# Patient Record
Sex: Female | Born: 1970
Health system: Southern US, Community
[De-identification: ages and names within clinical notes are randomized; demographics above are authoritative.]

## PROBLEM LIST (undated history)

## (undated) DIAGNOSIS — T8859XA Other complications of anesthesia, initial encounter: Secondary | ICD-10-CM

## (undated) DIAGNOSIS — T7840XA Allergy, unspecified, initial encounter: Secondary | ICD-10-CM

## (undated) DIAGNOSIS — T4145XA Adverse effect of unspecified anesthetic, initial encounter: Secondary | ICD-10-CM

## (undated) DIAGNOSIS — M1990 Unspecified osteoarthritis, unspecified site: Secondary | ICD-10-CM

## (undated) DIAGNOSIS — F419 Anxiety disorder, unspecified: Secondary | ICD-10-CM

## (undated) HISTORY — PX: CYST EXCISION: SHX5701

## (undated) HISTORY — PX: OVARY SURGERY: SHX727

## (undated) HISTORY — DX: Anxiety disorder, unspecified: F41.9

## (undated) HISTORY — DX: Allergy, unspecified, initial encounter: T78.40XA

---

## 1998-03-25 HISTORY — PX: BREAST ENHANCEMENT SURGERY: SHX7

## 1999-03-26 HISTORY — PX: AUGMENTATION MAMMAPLASTY: SUR837

## 2004-06-14 ENCOUNTER — Ambulatory Visit: Payer: Self-pay | Admitting: Obstetrics and Gynecology

## 2005-11-27 ENCOUNTER — Ambulatory Visit: Payer: Self-pay | Admitting: Orthopaedic Surgery

## 2007-08-18 ENCOUNTER — Ambulatory Visit: Payer: Self-pay | Admitting: Obstetrics and Gynecology

## 2007-08-24 ENCOUNTER — Ambulatory Visit: Payer: Self-pay | Admitting: Obstetrics and Gynecology

## 2008-03-25 HISTORY — PX: ABDOMINAL HYSTERECTOMY: SHX81

## 2012-02-18 ENCOUNTER — Ambulatory Visit: Payer: Self-pay | Admitting: Obstetrics and Gynecology

## 2012-12-30 ENCOUNTER — Encounter: Payer: Self-pay | Admitting: General Surgery

## 2012-12-30 ENCOUNTER — Ambulatory Visit (INDEPENDENT_AMBULATORY_CARE_PROVIDER_SITE_OTHER): Payer: 59 | Admitting: General Surgery

## 2012-12-30 ENCOUNTER — Ambulatory Visit: Payer: Self-pay | Admitting: General Surgery

## 2012-12-30 VITALS — BP 98/64 | HR 66 | Resp 12 | Ht 67.5 in | Wt 142.0 lb

## 2012-12-30 DIAGNOSIS — K429 Umbilical hernia without obstruction or gangrene: Secondary | ICD-10-CM

## 2012-12-30 NOTE — Progress Notes (Signed)
Patient ID: Barbara Frederick, female   DOB: 1970-10-11, 42 y.o.   MRN: [id number]  Chief Complaint  Patient presents with  . Other    mass below belly button    HPI Barbara Frederick is a 42 y.o. female here today for an evaluation mass below belly button becoming very painful, especially with direct pressure. Patient noticed this problem about 3 months ago and states it seems to be getting bigger. States it seems to about ping pong ball size. She has recently lost 20 pounds with weight watchers.  The uterus was removed with a morcellater on review of the August 24, 2007 operative report. Menstrual endometrium reported.   HPI  History reviewed. No pertinent past medical history.  Past Surgical History  Procedure Laterality Date  . Ovary surgery      one ovary [facility] cervix at [facility]  . Abdominal hysterectomy  2010    Dr Logan Bores    History reviewed. No pertinent family history.  Social History History  Substance Use Topics  . Smoking status: Never Smoker   . Smokeless tobacco: Never Used  . Alcohol Use: Yes     Comment: 3-5/week    Allergies  Allergen Reactions  . Codeine Itching    Current Outpatient Prescriptions  Medication Sig Dispense Refill  . estrogen-methylTESTOSTERone 0.625-1.25 MG per tablet Take 1 tablet by mouth daily.      . Multiple Vitamins-Minerals (WOMENS DAILY FORMULA PO) Take 1 tablet by mouth daily.      . progesterone (PROMETRIUM) 100 MG capsule Take 100 mg by mouth daily.      . Psyllium (METAMUCIL PO) Take 1 tablet by mouth daily.       No current facility-administered medications for this visit.    Review of Systems Review of Systems  Constitutional: Negative.   Respiratory: Negative.   Cardiovascular: Negative.     Blood pressure 98/64, pulse 66, resp. rate 12, height 5' 7.5" (1.715 m), weight 142 lb (64.411 kg).  Physical Exam Physical Exam  Constitutional: She is oriented to person, place, and time. She appears well-developed and  well-nourished.  Neck: Neck supple.  Cardiovascular: Normal rate, regular rhythm and normal heart sounds.   Pulmonary/Chest: Effort normal and breath sounds normal.  Abdominal: Soft. Normal appearance.  3 cm mass below umbilicus   Lymphadenopathy:    She has no cervical adenopathy.  Neurological: She is alert and oriented to person, place, and time.  Skin: Skin is warm and dry.    Data Reviewed Operative report. Pathology report.   Assessment    Umbilical/ventral hernia versus endometrioma.    Plan    Options for management were reviewed: 1) observation versus 2) exploration/excision/repair.  At this time the patient's reports of increasing size and increasing tenderness would speak towards elective exploration.     Patient's surgery has been scheduled for 01-29-13 at [facility].   Earline Mayotte 12/31/2012, 4:13 PM

## 2012-12-30 NOTE — Patient Instructions (Addendum)
Hernia, Surgical Repair A hernia occurs when an internal organ pushes out through a weak spot in the belly (abdominal) wall muscles. Hernias commonly occur in the groin and around the navel. Hernias often can be pushed back into place (reduced). Most hernias tend to get worse over time. Problems occur when abdominal contents get stuck in the opening (incarcerated hernia). The blood supply gets cut off (strangulated hernia). This is an emergency and needs surgery. Otherwise, hernia repair can be an elective procedure. This means you can schedule this at your convenience when an emergency is not present. Because complications can occur, if you decide to repair the hernia, it is best to do it soon. When it becomes an emergency procedure, there is increased risk of complications after surgery. CAUSES   Heavy lifting.  Obesity.  Prolonged coughing.  Straining to move your bowels.  Hernias can also occur through a cut (incision) by a surgeonafter an abdominal operation. HOME CARE INSTRUCTIONS Before the repair:  Bed rest is not required. You may continue your normal activities, but avoid heavy lifting (more than 10 pounds) or straining. Cough gently. If you are a smoker, it is best to stop. Even the best hernia repair can break down with the continual strain of coughing.  Do not wear anything tight over your hernia. Do not try to keep it in with an outside bandage or truss. These can damage abdominal contents if they are trapped in the hernia sac.  Eat a normal diet. Avoid constipation. Straining over long periods of time to have a bowel movement will increase hernia size. It also can breakdown repairs. If you cannot do this with diet alone, laxatives or stool softeners may be used. PRIOR TO SURGERY, SEEK IMMEDIATE MEDICAL CARE IF: You have problems (symptoms) of a trapped (incarcerated) hernia. Symptoms include:  An oral temperature above 102 F (38.9 C) develops, or as your caregiver  suggests.  Increasing abdominal pain.  Feeling sick to your stomach(nausea) and vomiting.  You stop passing gas or stool.  The hernia is stuck outside the abdomen, looks discolored, feels hard, or is tender.  You have any changes in your bowel habits or in the hernia that is unusual for you. LET YOUR CAREGIVERS KNOW ABOUT THE FOLLOWING:  Allergies.  Medications taken including herbs, eye drops, over the counter medications, and creams.  Use of steroids (by mouth or creams).  Family or personal history of problems with anesthetics or Novocaine.  Possibility of pregnancy, if this applies.  Personal history of blood clots (thrombophlebitis).  Family or personal history of bleeding or blood problems.  Previous surgery.  Other health problems. BEFORE THE PROCEDURE You should be present 1 hour prior to your procedure, or as directed by your caregiver.  AFTER THE PROCEDURE After surgery, you will be taken to the recovery area. A nurse will watch and check your progress there. Once you are awake, stable, and taking fluids well, you will be allowed to go home as long as there are no problems. Once home, an ice pack (wrapped in a light towel) applied to your operative site may help with discomfort. It may also keep the swelling down. Do not lift anything heavier than 10 pounds (4.55 kilograms). Take showers not baths. Do not drive while taking narcotics. Follow instructions as suggested by your caregiver.  SEEK IMMEDIATE MEDICAL CARE IF: After surgery:  There is redness, swelling, or increasing pain in the wound.  There is pus coming from the wound.  There is  drainage from a wound lasting longer than 1 day.  An unexplained oral temperature above 102 F (38.9 C) develops.  You notice a foul smell coming from the wound or dressing.  There is a breaking open of a wound (edged not staying together) after the sutures have been removed.  You notice increasing pain in the shoulders  (shoulder strap areas).  You develop dizzy episodes or fainting while standing.  You develop persistent nausea or vomiting.  You develop a rash.  You have difficulty breathing.  You develop any reaction or side effects to medications given. MAKE SURE YOU:   Understand these instructions.  Will watch your condition.  Will get help right away if you are not doing well or get worse. Document Released: 09/04/2000 Document Revised: 06/03/2011 Document Reviewed: 07/28/2007 [facility] Patient Information 2014 [place], [place].    Hernia precautions and incarceration were discussed with the patient. If they develop symptoms of an incarcerated hernia, they were encouraged to seek prompt medical attention.  I have recommended repair of the hernia using mesh on an outpatient basis in the near future. The risk of infection was reviewed. The role of prosthetic mesh to minimize the risk of recurrence was reviewed.  Patient's surgery has been scheduled for 01-29-13 at [facility]

## 2012-12-31 ENCOUNTER — Other Ambulatory Visit: Payer: Self-pay | Admitting: General Surgery

## 2012-12-31 ENCOUNTER — Telehealth: Payer: Self-pay | Admitting: *Deleted

## 2012-12-31 ENCOUNTER — Encounter: Payer: Self-pay | Admitting: General Surgery

## 2012-12-31 DIAGNOSIS — K429 Umbilical hernia without obstruction or gangrene: Secondary | ICD-10-CM | POA: Insufficient documentation

## 2012-12-31 DIAGNOSIS — R1905 Periumbilic swelling, mass or lump: Secondary | ICD-10-CM | POA: Insufficient documentation

## 2012-12-31 NOTE — Telephone Encounter (Signed)
Patient called to cancel umbilical hernia that was scheduled for 01-29-13 at [facility]. She does not want to have surgery at this time and will call back if she wishes to reschedule.   Message has been left on the O.R. Posting line with this information. Phone interview cancelled in Provider Portal.

## 2013-01-14 ENCOUNTER — Ambulatory Visit: Payer: Self-pay | Admitting: Surgery

## 2013-01-22 ENCOUNTER — Ambulatory Visit: Payer: Self-pay | Admitting: Surgery

## 2013-02-24 ENCOUNTER — Ambulatory Visit: Payer: Self-pay | Admitting: Obstetrics and Gynecology

## 2013-10-28 ENCOUNTER — Encounter: Payer: Self-pay | Admitting: Psychiatry

## 2013-11-23 ENCOUNTER — Encounter: Payer: Self-pay | Admitting: Psychiatry

## 2013-12-23 ENCOUNTER — Encounter: Payer: Self-pay | Admitting: Psychiatry

## 2014-01-24 ENCOUNTER — Encounter: Payer: Self-pay | Admitting: General Surgery

## 2014-04-27 ENCOUNTER — Ambulatory Visit: Payer: Self-pay | Admitting: Cardiology

## 2014-04-28 ENCOUNTER — Ambulatory Visit: Payer: Self-pay | Admitting: Obstetrics & Gynecology

## 2014-05-02 ENCOUNTER — Ambulatory Visit: Payer: Self-pay | Admitting: Cardiovascular Disease

## 2014-05-03 ENCOUNTER — Encounter: Payer: Self-pay | Admitting: *Deleted

## 2014-05-11 ENCOUNTER — Ambulatory Visit (INDEPENDENT_AMBULATORY_CARE_PROVIDER_SITE_OTHER): Payer: 59 | Admitting: General Surgery

## 2014-05-11 ENCOUNTER — Encounter: Payer: Self-pay | Admitting: General Surgery

## 2014-05-11 VITALS — BP 138/78 | HR 82 | Resp 12 | Ht 67.0 in | Wt 154.0 lb

## 2014-05-11 DIAGNOSIS — N644 Mastodynia: Secondary | ICD-10-CM | POA: Insufficient documentation

## 2014-05-11 NOTE — Progress Notes (Signed)
Patient ID: Barbara Frederick, female   DOB: November 28, 1970, 44 y.o.   MRN: [id number]  Chief Complaint  Patient presents with  . Other    bilateral breast pain    HPI Barbara Frederick is a 44 y.o. female who presents for left breast pain. She states she noticed the pain approximately 3-4 weeks ago. The pain has stayed about the same. The pain is described as a burning sensation as well as an achiness. She also states she has occasional shooting pain that comes and goes. The pain is located in the left outer quadrant centrally. The pain is always there but at times its worse than others. She denies any lump but does state she feels like the left breast is larger than the right. Her most recent mammogram was done on 04/28/14. The shooting pain starts deep and goes outward towards the nipple. The left breast is warm to the touch. No injuries to the breasts. No trouble with her breasts in the past. She has not tried anything for the breast pain.   The patient was accompanied for the exam and post exam and interview by her husband.  HPI  Past Medical History  Diagnosis Date  . Anxiety   . Allergy     Past Surgical History  Procedure Laterality Date  . Ovary surgery      [facility] cervix at [facility]  . Abdominal hysterectomy  2010    Dr Amalia Hailey  . Breast enhancement surgery Bilateral [place] [facility]  . Cyst excision      Family History  Problem Relation Age of Onset  . Cancer Paternal Aunt 51    breast  . Cancer Paternal Grandmother 58    breast    Social History History  Substance Use Topics  . Smoking status: Never Smoker   . Smokeless tobacco: Never Used  . Alcohol Use: Yes     Comment: 3-5/week    Allergies  Allergen Reactions  . Codeine Itching    Current Outpatient Prescriptions  Medication Sig Dispense Refill  . Ascorbic Acid (VITAMIN C PO) Take by mouth.    . Loratadine (CLARITIN PO) Take by mouth.    . mometasone (NASONEX) 50  MCG/ACT nasal spray Place 2 sprays into the nose daily.    . Multiple Vitamins-Minerals (WOMENS DAILY FORMULA PO) Take 1 tablet by mouth daily.    . progesterone (PROMETRIUM) 100 MG capsule Take 100 mg by mouth daily.    . Psyllium (METAMUCIL PO) Take 1 tablet by mouth daily.    . VENLAFAXINE HCL PO Take by mouth.     No current facility-administered medications for this visit.    Review of Systems Review of Systems  Constitutional: Negative.   Respiratory: Negative.   Cardiovascular: Negative.     Blood pressure 138/78, pulse 82, resp. rate 12, height 5\' 7"  (1.702 m), weight 154 lb (69.854 kg).  Physical Exam Physical Exam  Constitutional: She is oriented to person, place, and time. She appears well-developed and well-nourished.  Neck: Neck supple. No thyromegaly present.  Cardiovascular: Normal rate, regular rhythm and normal heart sounds.   No murmur heard. Pulmonary/Chest: Effort normal and breath sounds normal. Right breast exhibits no inverted nipple, no mass, no nipple discharge, no skin change and no tenderness. Left breast exhibits no inverted nipple, no mass, no nipple discharge, no skin change and no tenderness.  Lymphadenopathy:    She has no cervical adenopathy.  She has no axillary adenopathy.  Neurological: She is alert and oriented to person, place, and time.  Skin: Skin is warm and dry.    Data Reviewed Bilateral diagnostic mammograms dated 04/28/2014 were reviewed. BI-RADS-1.  The breasts are essentially fatty replaced. No architectural distortion. No evidence of implant rupture.  Assessment    Benign breast exam, episodic breast pain.    Plan    The patient has been encouraged to make use of a two-week trial of Aleve, 2 tablets twice a day. She was encouraged to be sure that her undergarments fit properly. She was asked to give a phone follow-up in 2 weeks with her progress. If she has not had improvement over 2 months, we'll consider further  investigation. With a benign exam and a fatty replaced breast, I think there is little likelihood of a missed malignancy. Without evidence of a palpable abnormality or mammographic abnormality, little suspicion for implant rupture.    PCP:  Miguel Aschoff Ref. MD: [facility]  Robert Bellow 05/11/2014, 8:53 PM

## 2014-05-11 NOTE — Patient Instructions (Addendum)
Patient advised to use Aleve 2 twice a day to help with pain relief. The patient is aware to call back for any questions or concerns. Patient advised to contact our office in 2 weeks with a report of how the Aleve is working.

## 2014-05-17 LAB — HEPATIC FUNCTION PANEL
ALT: 17 U/L (ref 7–35)
AST: 27 U/L (ref 13–35)
Alkaline Phosphatase: 54 U/L (ref 25–125)
Bilirubin, Total: 0.6 mg/dL

## 2014-05-17 LAB — LIPID PANEL
Cholesterol: 179 mg/dL (ref 0–200)
HDL: 61 mg/dL (ref 35–70)
LDL Cholesterol: 96 mg/dL
LDL/HDL RATIO: 1.6
TRIGLYCERIDES: 109 mg/dL (ref 40–160)

## 2014-05-17 LAB — BASIC METABOLIC PANEL
BUN: 12 mg/dL (ref 4–21)
CREATININE: 0.9 mg/dL (ref 0.5–1.1)
Glucose: 101 mg/dL
Potassium: 4.3 mmol/L (ref 3.4–5.3)
Sodium: 143 mmol/L (ref 137–147)

## 2014-05-17 LAB — CBC AND DIFFERENTIAL
HCT: 39 % (ref 36–46)
Hemoglobin: 13.4 g/dL (ref 12.0–16.0)
Neutrophils Absolute: 4 /uL
Platelets: 221 10*3/uL (ref 150–399)
WBC: 6.5 10*3/mL

## 2014-05-17 LAB — TSH: TSH: 2.41 u[IU]/mL (ref 0.41–5.90)

## 2014-07-15 NOTE — Op Note (Signed)
PATIENT NAME:  Barbara Frederick, Barbara Frederick MR#:  [id number] DATE OF BIRTH:  1970-12-26  DATE OF PROCEDURE:  01/22/2013  PREOPERATIVE DIAGNOSIS: Possible incarcerated umbilical hernia.   POSTOPERATIVE DIAGNOSIS: Abdominal wall mass.   PROCEDURE: Excision of abdominal wall mass.   SURGEON: Rochel Brome, M.D.   ANESTHESIA: General.   INDICATIONS: This 44 year old has had 2 previous laparoscopies and presented with a mass just below the umbilicus and it appeared on exam.  It may possibly be an incarcerated umbilical hernia and repair was recommended for definitive treatment.   DESCRIPTION OF PROCEDURE: The patient was placed on the operating table in the supine position under general anesthesia. The abdomen was prepared with ChloraPrep and draped in a sterile manner.   A short longitudinal incision was made extending from the inferior aspect of the umbilicus caudally.  The incision was approximately 2.5 cm in length.  It was carried down through the subcutaneous tissues to encounter a mass which was dissected free from surrounding structures. Did have a smooth plane of resection and appeared to have minimal blood supply. The mass was completely excised. It did have a smooth surface and had a somewhat white appearance. The measurement was 2.8 cm and was submitted in formalin for routine pathology. The wound was inspected. Several small bleeding points were cauterized. Subcutaneous tissues were infiltrated with Sensorcaine with epinephrine. The subcutaneous tissues were approximated with a 5-0 Monocryl suture and then the skin was closed with running 5-0 Monocryl subcuticular suture and Dermabond. The patient tolerated surgery satisfactorily and was then prepared for transfer to the recovery room.   ____________________________ Lenna Sciara. Rochel Brome, MD jws:dp D: 01/22/2013 11:13:48 ET T: 01/22/2013 11:20:44 ET JOB#: [id number]  cc: Loreli Dollar, MD, <Dictator> Loreli Dollar MD ELECTRONICALLY SIGNED 01/22/2013  18:28

## 2014-09-02 ENCOUNTER — Other Ambulatory Visit: Payer: Self-pay | Admitting: Emergency Medicine

## 2014-09-02 ENCOUNTER — Encounter: Payer: Self-pay | Admitting: Emergency Medicine

## 2014-09-02 DIAGNOSIS — G4700 Insomnia, unspecified: Secondary | ICD-10-CM | POA: Insufficient documentation

## 2014-09-02 DIAGNOSIS — F32A Depression, unspecified: Secondary | ICD-10-CM | POA: Insufficient documentation

## 2014-09-02 DIAGNOSIS — F329 Major depressive disorder, single episode, unspecified: Secondary | ICD-10-CM | POA: Insufficient documentation

## 2014-09-02 DIAGNOSIS — F419 Anxiety disorder, unspecified: Secondary | ICD-10-CM | POA: Insufficient documentation

## 2014-09-02 DIAGNOSIS — M1990 Unspecified osteoarthritis, unspecified site: Secondary | ICD-10-CM | POA: Insufficient documentation

## 2014-09-02 DIAGNOSIS — K219 Gastro-esophageal reflux disease without esophagitis: Secondary | ICD-10-CM | POA: Insufficient documentation

## 2014-09-02 DIAGNOSIS — J302 Other seasonal allergic rhinitis: Secondary | ICD-10-CM | POA: Insufficient documentation

## 2014-11-10 ENCOUNTER — Encounter: Payer: Self-pay | Admitting: Family Medicine

## 2014-11-10 ENCOUNTER — Ambulatory Visit (INDEPENDENT_AMBULATORY_CARE_PROVIDER_SITE_OTHER): Payer: 59 | Admitting: Family Medicine

## 2014-11-10 VITALS — BP 102/74 | HR 64 | Temp 98.4°F | Resp 12 | Wt 147.0 lb

## 2014-11-10 DIAGNOSIS — F329 Major depressive disorder, single episode, unspecified: Secondary | ICD-10-CM

## 2014-11-10 DIAGNOSIS — G4700 Insomnia, unspecified: Secondary | ICD-10-CM

## 2014-11-10 DIAGNOSIS — F419 Anxiety disorder, unspecified: Secondary | ICD-10-CM

## 2014-11-10 DIAGNOSIS — F32A Depression, unspecified: Secondary | ICD-10-CM

## 2014-11-10 NOTE — Progress Notes (Signed)
Patient ID: Barbara Frederick, female   DOB: [birth year], 44 y.o.   MRN: [id number]    Subjective:  HPI  Depression follow up: Patient was last seen in February 2016. In March she called and wanted to get off Effexor. She is off the medication and feels fine. Not having any issues of concern.  Insomnia follow up: She is doing well with this. She has Xanax that she takes maybe 1 to 2 times a month to help her sleep or if she gets anxious about something coming up. No issues of concern.   Patient has stopped all of her hormonal medications and vitamins except for Multivitamin, Nasonex, Claritin. She feels good.  Prior to Admission medications   Medication Sig Start Date End Date Taking? Authorizing Provider  ALPRAZolam Duanne Moron) 0.5 MG tablet Take by mouth. 03/02/14  Yes Historical Provider, MD  loratadine (CLARITIN) 10 MG tablet Take by mouth.   Yes Historical Provider, MD  mometasone (NASONEX) 50 MCG/ACT nasal spray Place into the nose.   Yes Historical Provider, MD  Multiple Vitamin (MULTI-VITAMINS) TABS Take by mouth.   Yes Historical Provider, MD    Patient Active Problem List   Diagnosis Date Noted  . Anxiety 09/02/2014  . Arthritis 09/02/2014  . Clinical depression 09/02/2014  . Gastro-esophageal reflux disease without esophagitis 09/02/2014  . Insomnia 09/02/2014  . Allergic rhinitis, seasonal 09/02/2014  . Breast pain, left 05/11/2014  . Umbilical hernia 40/98/1191  . Abdominal wall mass of periumbilical region 47/82/9562    Past Medical History  Diagnosis Date  . Anxiety   . Allergy     Social History   Social History  . Marital Status: Married    Spouse Name: N/A  . Number of Children: N/A  . Years of Education: N/A   Occupational History  . Not on file.   Social History Main Topics  . Smoking status: Never Smoker   . Smokeless tobacco: Never Used  . Alcohol Use: Yes     Comment: 3-5/week  . Drug Use: No  . Sexual Activity: Not on file   Other  Topics Concern  . Not on file   Social History Narrative    Allergies  Allergen Reactions  . Codeine Itching    itching  . Codeine Phosphate Itching    Review of Systems  Constitutional: Negative.   Respiratory: Negative.   Cardiovascular: Negative.   Gastrointestinal: Negative.   Musculoskeletal: Negative.   Endo/Heme/Allergies: Negative.   Psychiatric/Behavioral: Negative.      There is no immunization history on file for this patient. Objective:  BP 102/74 mmHg  Pulse 64  Temp(Src) 98.4 F (36.9 C)  Resp 12  Wt 147 lb (66.679 kg)  Physical Exam  Constitutional: She is oriented to person, place, and time and well-developed, well-nourished, and in no distress.  HENT:  Head: Normocephalic and atraumatic.  Right Ear: External ear normal.  Left Ear: External ear normal.  Nose: Nose normal.  Eyes: Conjunctivae are normal. Pupils are equal, round, and reactive to light.  Neck: Normal range of motion. Neck supple.  Cardiovascular: Normal rate, regular rhythm, normal heart sounds and intact distal pulses.   No murmur heard. Pulmonary/Chest: Effort normal and breath sounds normal. No respiratory distress. She has no wheezes.  Musculoskeletal: Normal range of motion. She exhibits no edema or tenderness.  Neurological: She is alert and oriented to person, place, and time. Gait normal.  Skin: Skin is warm and dry.  Psychiatric: Mood, memory, affect and judgment  normal.    Lab Results  Component Value Date   WBC 6.5 05/17/2014   HGB 13.4 05/17/2014   HCT 39 05/17/2014   PLT 221 05/17/2014   CHOL 179 05/17/2014   TRIG 109 05/17/2014   HDL 61 05/17/2014   LDLCALC 96 05/17/2014   TSH 2.41 05/17/2014    CMP     Component Value Date/Time   NA 143 05/17/2014   K 4.3 05/17/2014   BUN 12 05/17/2014   CREATININE 0.9 05/17/2014   AST 27 05/17/2014   ALT 17 05/17/2014   ALKPHOS 54 05/17/2014    Assessment and Plan :  1. Clinical depression Stable off the  medication. Follow.  2. Insomnia Stable. Uses Xanax 1 to 2 times a month.  3. Anxiety Stable.  4. AR Stable on Nasonex and Claritin.  I will see her back in 2017 for a medical physical. GYN at [facility]  Patient was seen and examined by Dr. Eulas Post and note was scribed by Theressa Millard, RMA.    Miguel Aschoff MD [facility] 11/10/2014 8:39 AM

## 2015-01-19 DIAGNOSIS — D229 Melanocytic nevi, unspecified: Secondary | ICD-10-CM

## 2015-01-19 HISTORY — DX: Melanocytic nevi, unspecified: D22.9

## 2015-04-03 ENCOUNTER — Ambulatory Visit: Payer: 59 | Admitting: Family Medicine

## 2015-04-05 ENCOUNTER — Ambulatory Visit (INDEPENDENT_AMBULATORY_CARE_PROVIDER_SITE_OTHER): Payer: 59 | Admitting: Family Medicine

## 2015-04-05 ENCOUNTER — Encounter: Payer: Self-pay | Admitting: Family Medicine

## 2015-04-05 VITALS — BP 110/72 | HR 74 | Temp 97.8°F | Resp 16 | Wt 155.0 lb

## 2015-04-05 DIAGNOSIS — F419 Anxiety disorder, unspecified: Secondary | ICD-10-CM

## 2015-04-05 DIAGNOSIS — F329 Major depressive disorder, single episode, unspecified: Secondary | ICD-10-CM | POA: Diagnosis not present

## 2015-04-05 DIAGNOSIS — F32A Depression, unspecified: Secondary | ICD-10-CM

## 2015-04-05 NOTE — Progress Notes (Signed)
Patient ID: Barbara Frederick, female   DOB: 11-12-1970, 45 y.o.   MRN: [id number]    Subjective:  HPI Pt reports that she started having similar symptoms that she had when she was diagnosed with depression and anxiety. It started right around/before the Holidays. At first she thought it was the [facility] making her feel more anxious, glumly, aggravated and down. But since the holidays have passed she is still feeling the same. She had taken Effexor in the past and she felt better but she gained 20 lbs and she does not want to gain weight again. She would like to discuss what her other options are. Pt thinks it could be possible seasonal effective disorder.  Prior to Admission medications   Medication Sig Start Date End Date Taking? Authorizing Provider  loratadine (CLARITIN) 10 MG tablet Take by mouth.   Yes Historical Provider, MD  mometasone (NASONEX) 50 MCG/ACT nasal spray Place into the nose.   Yes Historical Provider, MD  Multiple Vitamin (MULTI-VITAMINS) TABS Take by mouth.   Yes Historical Provider, MD  ALPRAZolam Duanne Moron) 0.5 MG tablet Take by mouth. Reported on 04/05/2015 03/02/14   Historical Provider, MD    Patient Active Problem List   Diagnosis Date Noted  . Anxiety 09/02/2014  . Arthritis 09/02/2014  . Clinical depression 09/02/2014  . Gastro-esophageal reflux disease without esophagitis 09/02/2014  . Insomnia 09/02/2014  . Allergic rhinitis, seasonal 09/02/2014  . Breast pain, left 05/11/2014  . Umbilical hernia 0000000  . Abdominal wall mass of periumbilical region 0000000    Past Medical History  Diagnosis Date  . Anxiety   . Allergy     Social History   Social History  . Marital Status: Married    Spouse Name: N/A  . Number of Children: N/A  . Years of Education: N/A   Occupational History  . Not on file.   Social History Main Topics  . Smoking status: Never Smoker   . Smokeless tobacco: Never Used  . Alcohol Use: Yes     Comment: 3-5/week  .  Drug Use: No  . Sexual Activity: Not on file   Other Topics Concern  . Not on file   Social History Narrative    Allergies  Allergen Reactions  . Codeine Itching    itching  . Codeine Phosphate Itching    Review of Systems  Constitutional: Negative.   HENT: Negative.   Eyes: Negative.   Respiratory: Negative.   Cardiovascular: Negative.   Gastrointestinal: Negative.   Genitourinary: Negative.   Musculoskeletal: Positive for joint pain (sees ortho for left knee pain).  Skin: Negative.   Neurological: Negative.   Endo/Heme/Allergies: Negative.   Psychiatric/Behavioral: Positive for depression. The patient is nervous/anxious.     Immunization History  Administered Date(s) Administered  . Influenza-Unspecified 01/07/2015   Objective:  BP 110/72 mmHg  Pulse 74  Temp(Src) 97.8 F (36.6 C) (Oral)  Resp 16  Wt 155 lb (70.308 kg)  Physical Exam  Constitutional: She is oriented to person, place, and time and well-developed, well-nourished, and in no distress.  HENT:  Head: Normocephalic and atraumatic.  Right Ear: External ear normal.  Left Ear: External ear normal.  Nose: Nose normal.  Eyes: Conjunctivae and EOM are normal. Pupils are equal, round, and reactive to light.  Neck: Normal range of motion. Neck supple.  Cardiovascular: Normal rate, regular rhythm, normal heart sounds and intact distal pulses.   Pulmonary/Chest: Effort normal and breath sounds normal.  Abdominal: Soft.  Musculoskeletal: Normal  range of motion.  Neurological: She is alert and oriented to person, place, and time. She has normal reflexes. Gait normal. GCS score is 15.  Skin: Skin is warm and dry.  Psychiatric: Mood, memory, affect and judgment normal.    Lab Results  Component Value Date   WBC 6.5 05/17/2014   HGB 13.4 05/17/2014   HCT 39 05/17/2014   PLT 221 05/17/2014   CHOL 179 05/17/2014   TRIG 109 05/17/2014   HDL 61 05/17/2014   LDLCALC 96 05/17/2014   TSH 2.41 05/17/2014     CMP     Component Value Date/Time   NA 143 05/17/2014   K 4.3 05/17/2014   BUN 12 05/17/2014   CREATININE 0.9 05/17/2014   AST 27 05/17/2014   ALT 17 05/17/2014   ALKPHOS 54 05/17/2014    Assessment and Plan :  1. Clinical depression Depression screen [facility] 2/9 04/05/2015 11/10/2014  Decreased Interest 2 0  Down, Depressed, Hopeless 3 0  PHQ - 2 Score 5 0  Altered sleeping 1 -  Tired, decreased energy 2 -  Change in appetite 2 -  Feeling bad or failure about yourself  3 -  Trouble concentrating 3 -  Moving slowly or fidgety/restless 2 -  Suicidal thoughts 0 -  PHQ-9 Score 18 -  Difficult doing work/chores Somewhat difficult -   2. Anxiety  Possible seasonal affective disorder, recommended her to get a lamp that is especially made for this. Follow up in about a month.  Would try Cymbalta  If not improving.  Patient was seen and examined by Dr. Miguel Aschoff, and noted scribed by Webb Laws, [place] MD [facility] 04/05/2015 11:30 AM

## 2015-04-05 NOTE — Patient Instructions (Signed)
Google seasonal affective disorder and look for lamp that is specially made for this.

## 2015-04-11 DIAGNOSIS — Z62810 Personal history of physical and sexual abuse in childhood: Secondary | ICD-10-CM | POA: Diagnosis not present

## 2015-04-11 DIAGNOSIS — Z01419 Encounter for gynecological examination (general) (routine) without abnormal findings: Secondary | ICD-10-CM | POA: Diagnosis not present

## 2015-04-11 DIAGNOSIS — Z90710 Acquired absence of both cervix and uterus: Secondary | ICD-10-CM | POA: Diagnosis not present

## 2015-05-15 ENCOUNTER — Ambulatory Visit: Payer: 59 | Admitting: Family Medicine

## 2015-05-29 ENCOUNTER — Other Ambulatory Visit: Payer: Self-pay | Admitting: Obstetrics & Gynecology

## 2015-05-29 DIAGNOSIS — Z1231 Encounter for screening mammogram for malignant neoplasm of breast: Secondary | ICD-10-CM

## 2015-05-30 ENCOUNTER — Other Ambulatory Visit: Payer: Self-pay | Admitting: Obstetrics & Gynecology

## 2015-05-30 ENCOUNTER — Ambulatory Visit
Admission: RE | Admit: 2015-05-30 | Discharge: 2015-05-30 | Disposition: A | Discharge status: Home / Self Care | Payer: 59 | Source: Ambulatory Visit | Attending: Obstetrics & Gynecology | Admitting: Obstetrics & Gynecology

## 2015-05-30 DIAGNOSIS — Z1231 Encounter for screening mammogram for malignant neoplasm of breast: Secondary | ICD-10-CM

## 2015-06-27 ENCOUNTER — Encounter: Payer: Self-pay | Admitting: Family Medicine

## 2015-06-27 ENCOUNTER — Ambulatory Visit (INDEPENDENT_AMBULATORY_CARE_PROVIDER_SITE_OTHER): Payer: 59 | Admitting: Family Medicine

## 2015-06-27 VITALS — BP 108/62 | HR 74 | Temp 97.8°F | Resp 16 | Ht 67.0 in | Wt 157.0 lb

## 2015-06-27 DIAGNOSIS — Z0000 Encounter for general adult medical examination without abnormal findings: Secondary | ICD-10-CM | POA: Diagnosis not present

## 2015-06-27 NOTE — Progress Notes (Signed)
Patient ID: Barbara Frederick, female   DOB: [birth year], 45 y.o.   MRN: [id number]       Patient: Barbara Frederick, Female    DOB: 23-Nov-1970, 45 y.o.   MRN: [id number] Visit Date: 06/27/2015  Today's Provider: Wilhemena Durie, MD   Chief Complaint  Patient presents with  . Annual Exam   Subjective:    Annual physical exam Barbara Frederick is a 45 y.o. female who presents today for health maintenance and complete physical. She feels well. She reports exercising, walking, running, tennis about 3- 5 days a week. She reports she is sleeping fairly well.  ----------------------------------------------------------------- Mammogram- 05/30/15 normal Pap- possibly last year at [place] [facility].  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   Review of Systems  Constitutional: Negative.   HENT: Negative.   Eyes: Negative.   Respiratory: Negative.   Cardiovascular: Negative.   Gastrointestinal: Negative.   Endocrine: Negative.   Genitourinary: Negative.   Musculoskeletal: Negative.   Skin: Negative.   Allergic/Immunologic: Negative.   Neurological: Negative.   Hematological: Negative.   Psychiatric/Behavioral: Negative.     Social History      She  reports that she has never smoked. She has never used smokeless tobacco. She reports that she drinks alcohol. She reports that she does not use illicit drugs.       Social History   Social History  . Marital Status: Married    Spouse Name: N/A  . Number of Children: N/A  . Years of Education: N/A   Social History Main Topics  . Smoking status: Never Smoker   . Smokeless tobacco: Never Used  . Alcohol Use: Yes     Comment: 3-5/week  . Drug Use: No  . Sexual Activity: Not Asked   Other Topics Concern  . None   Social History Narrative    Past Medical History  Diagnosis Date  . Anxiety   . Allergy      Patient Active Problem List   Diagnosis Date Noted  . Anxiety 09/02/2014  .  Arthritis 09/02/2014  . Clinical depression 09/02/2014  . Gastro-esophageal reflux disease without esophagitis 09/02/2014  . Insomnia 09/02/2014  . Allergic rhinitis, seasonal 09/02/2014  . Breast pain, left 05/11/2014  . Umbilical hernia 0000000  . Abdominal wall mass of periumbilical region 0000000    Past Surgical History  Procedure Laterality Date  . Ovary surgery      one ovary [facility] cervix [facility]  . Abdominal hysterectomy  2010    Dr Amalia Hailey  . Breast enhancement surgery Bilateral [facility]  . Cyst excision    . Augmentation mammaplasty Bilateral 2001    Family History        Family Status  Relation Status Death Age  . Paternal Grandmother Deceased     cause of death was Alzhemier's  . Mother Alive   . Father Alive   . Sister Alive   . Brother Alive         Her family history includes Cancer (age of onset: 80) in her paternal aunt; Cancer (age of onset: 63) in her paternal grandmother; Hypertension in her mother.    Allergies  Allergen Reactions  . Codeine Itching    itching  . Codeine Phosphate Itching    Previous Medications   ALPRAZOLAM (XANAX) 0.5 MG TABLET    Take by mouth. Reported on 04/05/2015   LORATADINE (CLARITIN) 10  MG TABLET    Take by mouth.   MOMETASONE (NASONEX) 50 MCG/ACT NASAL SPRAY    Place into the nose.   MULTIPLE VITAMIN (MULTI-VITAMINS) TABS    Take by mouth.    Patient Care Team: Jerrol Banana., MD as PCP - General (Family Medicine) Robert Bellow, MD (General Surgery) No Pcp Per Patient [facility]) Honor Loh Ward, MD as Referring Physician (Obstetrics and Gynecology)     Objective:   Vitals: BP 108/62 mmHg  Pulse 74  Temp(Src) 97.8 F (36.6 C) (Oral)  Resp 16  Ht 5\' 7"  (1.702 m)  Wt 157 lb (71.215 kg)  BMI 24.58 kg/m2   Physical Exam  Constitutional: She is oriented to person, place, and time. She appears well-developed and well-nourished.  HENT:  Head: Normocephalic  and atraumatic.  Right Ear: External ear normal.  Left Ear: External ear normal.  Nose: Nose normal.  Mouth/Throat: Oropharynx is clear and moist.  Eyes: Conjunctivae and EOM are normal. Pupils are equal, round, and reactive to light.  Neck: Normal range of motion. Neck supple.  Cardiovascular: Normal rate, regular rhythm, normal heart sounds and intact distal pulses.   Pulmonary/Chest: Effort normal and breath sounds normal.  Abdominal: Soft. Bowel sounds are normal.  Musculoskeletal: Normal range of motion.  Neurological: She is alert and oriented to person, place, and time. She has normal reflexes.  Skin: Skin is warm and dry.  Psychiatric: She has a normal mood and affect. Her behavior is normal. Judgment and thought content normal.     Depression Screen PHQ 2/9 Scores 04/05/2015 11/10/2014  PHQ - 2 Score 5 0  PHQ- 9 Score 18 -      Assessment & Plan:     Routine Health Maintenance and Physical Exam  Exercise Activities and Dietary recommendations Goals    None      Immunization History  Administered Date(s) Administered  . Influenza-Unspecified 01/07/2015    Health Maintenance  Topic Date Due  . HIV Screening  04/13/1983  . TETANUS/TDAP  04/13/1987  . PAP SMEAR  04/12/1989  . INFLUENZA VACCINE  10/24/2015     gynecology  evaluation/exam per Dr. Leonides Schanz at [place]. Discussed health benefits of physical activity, and encouraged her to engage in regular exercise appropriate for her age and condition.   I have done the exam and reviewed the above chart and it is accurate to the best of my knowledge.  --------------------------------------------------------------------

## 2015-07-05 DIAGNOSIS — L578 Other skin changes due to chronic exposure to nonionizing radiation: Secondary | ICD-10-CM | POA: Diagnosis not present

## 2015-07-05 DIAGNOSIS — L820 Inflamed seborrheic keratosis: Secondary | ICD-10-CM | POA: Diagnosis not present

## 2015-07-05 DIAGNOSIS — L812 Freckles: Secondary | ICD-10-CM | POA: Diagnosis not present

## 2015-07-05 DIAGNOSIS — D485 Neoplasm of uncertain behavior of skin: Secondary | ICD-10-CM | POA: Diagnosis not present

## 2015-07-05 DIAGNOSIS — D229 Melanocytic nevi, unspecified: Secondary | ICD-10-CM | POA: Diagnosis not present

## 2015-07-05 DIAGNOSIS — L718 Other rosacea: Secondary | ICD-10-CM | POA: Diagnosis not present

## 2015-08-30 DIAGNOSIS — D229 Melanocytic nevi, unspecified: Secondary | ICD-10-CM | POA: Diagnosis not present

## 2015-08-30 DIAGNOSIS — L739 Follicular disorder, unspecified: Secondary | ICD-10-CM | POA: Diagnosis not present

## 2015-09-01 DIAGNOSIS — M1712 Unilateral primary osteoarthritis, left knee: Secondary | ICD-10-CM | POA: Diagnosis not present

## 2015-11-13 ENCOUNTER — Encounter: Payer: 59 | Admitting: Family Medicine

## 2016-01-22 ENCOUNTER — Ambulatory Visit (INDEPENDENT_AMBULATORY_CARE_PROVIDER_SITE_OTHER): Payer: 59 | Admitting: Family Medicine

## 2016-01-22 ENCOUNTER — Ambulatory Visit
Admission: RE | Admit: 2016-01-22 | Discharge: 2016-01-22 | Disposition: A | Discharge status: Home / Self Care | Payer: 59 | Source: Ambulatory Visit | Attending: Family Medicine | Admitting: Family Medicine

## 2016-01-22 ENCOUNTER — Encounter: Payer: Self-pay | Admitting: Family Medicine

## 2016-01-22 VITALS — BP 110/68 | HR 68 | Temp 97.8°F | Resp 16 | Wt 165.0 lb

## 2016-01-22 DIAGNOSIS — D1800 Hemangioma unspecified site: Secondary | ICD-10-CM | POA: Diagnosis not present

## 2016-01-22 DIAGNOSIS — F339 Major depressive disorder, recurrent, unspecified: Secondary | ICD-10-CM

## 2016-01-22 DIAGNOSIS — D229 Melanocytic nevi, unspecified: Secondary | ICD-10-CM | POA: Diagnosis not present

## 2016-01-22 DIAGNOSIS — R938 Abnormal findings on diagnostic imaging of other specified body structures: Secondary | ICD-10-CM | POA: Insufficient documentation

## 2016-01-22 DIAGNOSIS — L718 Other rosacea: Secondary | ICD-10-CM | POA: Diagnosis not present

## 2016-01-22 DIAGNOSIS — R05 Cough: Secondary | ICD-10-CM | POA: Diagnosis not present

## 2016-01-22 DIAGNOSIS — L812 Freckles: Secondary | ICD-10-CM | POA: Diagnosis not present

## 2016-01-22 DIAGNOSIS — R059 Cough, unspecified: Secondary | ICD-10-CM | POA: Insufficient documentation

## 2016-01-22 DIAGNOSIS — L578 Other skin changes due to chronic exposure to nonionizing radiation: Secondary | ICD-10-CM | POA: Diagnosis not present

## 2016-01-22 DIAGNOSIS — L821 Other seborrheic keratosis: Secondary | ICD-10-CM | POA: Diagnosis not present

## 2016-01-22 DIAGNOSIS — D485 Neoplasm of uncertain behavior of skin: Secondary | ICD-10-CM | POA: Diagnosis not present

## 2016-01-22 DIAGNOSIS — Z1283 Encounter for screening for malignant neoplasm of skin: Secondary | ICD-10-CM | POA: Diagnosis not present

## 2016-01-22 DIAGNOSIS — L820 Inflamed seborrheic keratosis: Secondary | ICD-10-CM | POA: Diagnosis not present

## 2016-01-22 MED ORDER — BUPROPION HCL ER (XL) 150 MG PO TB24: 150.0000 mg | ORAL_TABLET | Freq: Every day | ORAL | 5 refills | Status: DC

## 2016-01-22 NOTE — Progress Notes (Signed)
Patient: Barbara Frederick Female    DOB: Dec 07, 1970   45 y.o.   MRN: [id number] Visit Date: 01/22/2016  Today's Provider: Wilhemena Durie, MD   Chief Complaint  Patient presents with  . Depression  . Cough   Subjective:    Both the patient's 2 children are now in college and she has an empty nest with her husband. He definitely feels depressed over this. Lights  That  helped her SAB she is still using. I encouraged her to continue this. She is irritable and sad. She is not suicidal. She also has a cough and is worried about a family history of cancer. I think this is also affected by her depression and anxiety. She says she had a lot of exposure to secondary smoke as a child. Office been for about 6 months. It is very minimal and occasionally brings up some sputum. She does not feel sick at all. No other symptoms.   Depression         The problem has been gradually worsening since onset.  Associated symptoms include decreased concentration, fatigue, insomnia, irritable, restlessness, decreased interest, appetite change (increased) and sad.  Associated symptoms include no helplessness, no hopelessness, no body aches and no suicidal ideas.     The symptoms are aggravated by family issues (empty nest).  Treatments tried: Effexor; stopped taking due to weight gain. Would like to ask about the differences between Wellbutrin and venlafaxine. Pt also tried "light therapy" last year, but that has seemed to lose it's effect on pt.  Depression screen PHQ 2/9 01/22/2016  Decreased Interest 2  Down, Depressed, Hopeless 1  PHQ - 2 Score 3  Altered sleeping 1  Tired, decreased energy 1  Change in appetite 1  Feeling bad or failure about yourself  2  Trouble concentrating 3  Moving slowly or fidgety/restless 1  Suicidal thoughts 0  PHQ-9 Score 12  Difficult doing work/chores -    Cough Pt has noticed a productive cough when she exercises. Sputum is yellow/green. Has been  occurring for the last 8 months. Denies dyspnea. Pt is concerned because she has a strong family history of cancer, and was exposed to secondary smoke.    Allergies  Allergen Reactions  . Codeine Itching    itching  . Codeine Phosphate Itching     Current Outpatient Prescriptions:  .  ALPRAZolam (XANAX) 0.5 MG tablet, Take by mouth. Reported on 04/05/2015, Disp: , Rfl:  .  loratadine (CLARITIN) 10 MG tablet, Take by mouth., Disp: , Rfl:  .  mometasone (NASONEX) 50 MCG/ACT nasal spray, Place into the nose., Disp: , Rfl:  .  Multiple Vitamin (MULTI-VITAMINS) TABS, Take by mouth., Disp: , Rfl:   Review of Systems  Constitutional: Positive for appetite change (increased) and fatigue.  Eyes: Negative.   Respiratory: Positive for cough.   Cardiovascular: Negative.   Endocrine: Negative.   Allergic/Immunologic: Negative.   Neurological: Negative.   Hematological: Negative.   Psychiatric/Behavioral: Positive for decreased concentration and depression. Negative for suicidal ideas. The patient has insomnia.     Social History  Substance Use Topics  . Smoking status: Never Smoker  . Smokeless tobacco: Never Used  . Alcohol use Yes     Comment: 3-5/week   Objective:   BP 110/68 (BP Location: Left Arm, Patient Position: Sitting, Cuff Size: Normal)   Pulse 68   Temp 97.8 F (36.6 C) (Oral)   Resp 16   Wt 165 lb (  74.8 kg)   SpO2 97%   BMI 25.84 kg/m   Physical Exam  Constitutional: She is oriented to person, place, and time. She appears well-developed and well-nourished. She is irritable.  HENT:  Head: Normocephalic and atraumatic.  Right Ear: External ear normal.  Left Ear: External ear normal.  Nose: Nose normal.  Mouth/Throat: Oropharynx is clear and moist.  Eyes: Conjunctivae are normal. Pupils are equal, round, and reactive to light.  Cardiovascular: Normal rate, regular rhythm and normal heart sounds.   Pulmonary/Chest: Effort normal and breath sounds normal. No  respiratory distress.  Abdominal: Soft.  Neurological: She is alert and oriented to person, place, and time.  Skin: Skin is dry.  Psychiatric: She has a normal mood and affect. Her behavior is normal. Judgment and thought content normal.        Assessment & Plan:     1. Episode of recurrent major depressive disorder, unspecified depression episode severity ([place]) Worsening. Did not like Venlafaxine. Will treat with Wellbutrin as below. FU 1 month.Also consider counseling through [facility]. Patient earlier stated that she thought she gained weight with the Effexor but now realizes this was not the case. May go back to an SSRI on her next visit if she does not improve with the Wellbutrin - buPROPion (WELLBUTRIN XL) 150 MG 24 hr tablet; Take 1 tablet (150 mg total) by mouth daily.  Dispense: 30 tablet; Refill: 5  2. Cough Will check CXR. FU pending results.Patient asks about low-dose CT of the chest but I do not think she qualifies for this with the symptoms. She is a nonsmoker. - DG Chest 2 View; Future     Patient seen and examined by Miguel Aschoff, MD, and note scribed by Renaldo Fiddler, CMA.   Astou Lada Cranford Mon, MD  [facility]

## 2016-01-24 ENCOUNTER — Telehealth: Payer: Self-pay

## 2016-01-24 ENCOUNTER — Telehealth: Payer: Self-pay | Admitting: Family Medicine

## 2016-01-24 DIAGNOSIS — R05 Cough: Secondary | ICD-10-CM

## 2016-01-24 DIAGNOSIS — R9389 Abnormal findings on diagnostic imaging of other specified body structures: Secondary | ICD-10-CM

## 2016-01-24 DIAGNOSIS — R059 Cough, unspecified: Secondary | ICD-10-CM

## 2016-01-24 NOTE — Telephone Encounter (Signed)
Dr. Rosanna Randy, the [facility] said with abnormal xray findings it is usually just with contrast. Do you still want both or do you just want with?

## 2016-01-24 NOTE — Telephone Encounter (Signed)
[id number] 

## 2016-01-24 NOTE — Telephone Encounter (Signed)
In his message he said with and without. I did not see a normal CT of the chest order that was with and without contrast. If there is an order number that you have I can change it if I need to.

## 2016-01-24 NOTE — Telephone Encounter (Signed)
It's [id number] but CT tech at [facility] states that usually for abnormal findings on x ray it is CT chest W contrast

## 2016-01-24 NOTE — Telephone Encounter (Signed)
-----   Message from Jerrol Banana., MD sent at 01/23/2016  1:45 PM EDT ----- It does not look like cancer but this is not a completely normal chest x-ray. Would obtain CT of chest. Most likely with and without contrast

## 2016-01-24 NOTE — Telephone Encounter (Signed)
There is an order in EP[facility] for CT of chest with contrast and also a separate order for CT of chest without contrast.Which one is correct test ?

## 2016-01-24 NOTE — Telephone Encounter (Signed)
Pt informed. Orders placed for Ct

## 2016-01-25 NOTE — Telephone Encounter (Signed)
Just with--thanks

## 2016-01-25 NOTE — Telephone Encounter (Signed)
Order put in for chest with contrast only-aa

## 2016-01-25 NOTE — Telephone Encounter (Signed)
See below let us know if need to change orders-aa

## 2016-01-25 NOTE — Telephone Encounter (Signed)
OIK to change order --thx

## 2016-01-31 ENCOUNTER — Telehealth: Payer: Self-pay | Admitting: Family Medicine

## 2016-01-31 ENCOUNTER — Other Ambulatory Visit: Payer: Self-pay

## 2016-01-31 ENCOUNTER — Ambulatory Visit
Admission: RE | Admit: 2016-01-31 | Discharge: 2016-01-31 | Disposition: A | Discharge status: Home / Self Care | Payer: 59 | Source: Ambulatory Visit | Attending: Family Medicine | Admitting: Family Medicine

## 2016-01-31 DIAGNOSIS — R599 Enlarged lymph nodes, unspecified: Secondary | ICD-10-CM | POA: Diagnosis not present

## 2016-01-31 DIAGNOSIS — R938 Abnormal findings on diagnostic imaging of other specified body structures: Secondary | ICD-10-CM | POA: Diagnosis present

## 2016-01-31 DIAGNOSIS — C8590 Non-Hodgkin lymphoma, unspecified, unspecified site: Secondary | ICD-10-CM

## 2016-01-31 DIAGNOSIS — J929 Pleural plaque without asbestos: Secondary | ICD-10-CM | POA: Insufficient documentation

## 2016-01-31 DIAGNOSIS — R059 Cough, unspecified: Secondary | ICD-10-CM

## 2016-01-31 DIAGNOSIS — R05 Cough: Secondary | ICD-10-CM | POA: Diagnosis not present

## 2016-01-31 DIAGNOSIS — R9389 Abnormal findings on diagnostic imaging of other specified body structures: Secondary | ICD-10-CM

## 2016-01-31 MED ORDER — IOPAMIDOL (ISOVUE-300) INJECTION 61%
75.0000 mL | Freq: Once | INTRAVENOUS | Status: AC | PRN
  Administered 2016-01-31: 75 mL via INTRAVENOUS

## 2016-01-31 NOTE — Telephone Encounter (Signed)
Pt is requesting Dr. Rosanna Randy return her call to discuss the CT scan results that she saw on My Chart. Please advise. Thanks TNP

## 2016-01-31 NOTE — Telephone Encounter (Signed)
Spoke with dr Rosanna Randy and Barbara Frederick. Patient is on the way now to discuss this=-aa

## 2016-02-01 ENCOUNTER — Other Ambulatory Visit: Payer: Self-pay

## 2016-02-01 ENCOUNTER — Other Ambulatory Visit: Payer: Self-pay | Admitting: Family Medicine

## 2016-02-01 DIAGNOSIS — C8590 Non-Hodgkin lymphoma, unspecified, unspecified site: Secondary | ICD-10-CM | POA: Diagnosis not present

## 2016-02-01 NOTE — Progress Notes (Signed)
Ct abd

## 2016-02-02 ENCOUNTER — Inpatient Hospital Stay: Payer: 59

## 2016-02-02 ENCOUNTER — Inpatient Hospital Stay: Payer: 59 | Attending: Internal Medicine | Admitting: Internal Medicine

## 2016-02-02 ENCOUNTER — Encounter: Payer: Self-pay | Admitting: Internal Medicine

## 2016-02-02 DIAGNOSIS — M25551 Pain in right hip: Secondary | ICD-10-CM | POA: Diagnosis not present

## 2016-02-02 DIAGNOSIS — R590 Localized enlarged lymph nodes: Secondary | ICD-10-CM

## 2016-02-02 DIAGNOSIS — Z90710 Acquired absence of both cervix and uterus: Secondary | ICD-10-CM | POA: Insufficient documentation

## 2016-02-02 DIAGNOSIS — F419 Anxiety disorder, unspecified: Secondary | ICD-10-CM | POA: Diagnosis not present

## 2016-02-02 DIAGNOSIS — Z9882 Breast implant status: Secondary | ICD-10-CM | POA: Insufficient documentation

## 2016-02-02 DIAGNOSIS — R05 Cough: Secondary | ICD-10-CM | POA: Diagnosis not present

## 2016-02-02 DIAGNOSIS — Z79899 Other long term (current) drug therapy: Secondary | ICD-10-CM | POA: Diagnosis not present

## 2016-02-02 LAB — CBC WITH DIFFERENTIAL/PLATELET
BASOS: 1 %
Basophils Absolute: 0 10*3/uL (ref 0.0–0.2)
EOS (ABSOLUTE): 0.2 10*3/uL (ref 0.0–0.4)
EOS: 4 %
HEMATOCRIT: 40 % (ref 34.0–46.6)
HEMOGLOBIN: 14 g/dL (ref 11.1–15.9)
IMMATURE GRANULOCYTES: 0 %
Immature Grans (Abs): 0 10*3/uL (ref 0.0–0.1)
LYMPHS ABS: 1.4 10*3/uL (ref 0.7–3.1)
Lymphs: 24 %
MCH: 31.1 pg (ref 26.6–33.0)
MCHC: 35 g/dL (ref 31.5–35.7)
MCV: 89 fL (ref 79–97)
MONOCYTES: 10 %
MONOS ABS: 0.6 10*3/uL (ref 0.1–0.9)
Neutrophils Absolute: 3.7 10*3/uL (ref 1.4–7.0)
Neutrophils: 61 %
Platelets: 253 10*3/uL (ref 150–379)
RBC: 4.5 x10E6/uL (ref 3.77–5.28)
RDW: 13.2 % (ref 12.3–15.4)
WBC: 6 10*3/uL (ref 3.4–10.8)

## 2016-02-02 LAB — COMPREHENSIVE METABOLIC PANEL
A/G RATIO: 1.8 (ref 1.2–2.2)
ALBUMIN: 4.5 g/dL (ref 3.5–5.5)
ALT: 20 IU/L (ref 0–32)
AST: 26 IU/L (ref 0–40)
Alkaline Phosphatase: 73 IU/L (ref 39–117)
BUN / CREAT RATIO: 14 (ref 9–23)
BUN: 13 mg/dL (ref 6–24)
Bilirubin Total: 0.3 mg/dL (ref 0.0–1.2)
CALCIUM: 9.2 mg/dL (ref 8.7–10.2)
CO2: 24 mmol/L (ref 18–29)
CREATININE: 0.9 mg/dL (ref 0.57–1.00)
Chloride: 99 mmol/L (ref 96–106)
GFR calc Af Amer: 88 mL/min/{1.73_m2} (ref >59)
GFR, EST NON AFRICAN AMERICAN: 76 mL/min/{1.73_m2} (ref >59)
GLOBULIN, TOTAL: 2.5 g/dL (ref 1.5–4.5)
Glucose: 107 mg/dL — ABNORMAL HIGH (ref 65–99)
POTASSIUM: 4.3 mmol/L (ref 3.5–5.2)
SODIUM: 140 mmol/L (ref 134–144)
Total Protein: 7 g/dL (ref 6.0–8.5)

## 2016-02-02 LAB — LIPID PANEL WITH LDL/HDL RATIO
Cholesterol, Total: 233 mg/dL — ABNORMAL HIGH (ref 100–199)
HDL: 58 mg/dL (ref >39)
LDL CALC: 138 mg/dL — AB (ref 0–99)
LDL/HDL RATIO: 2.4 ratio (ref 0.0–3.2)
Triglycerides: 185 mg/dL — ABNORMAL HIGH (ref 0–149)
VLDL Cholesterol Cal: 37 mg/dL (ref 5–40)

## 2016-02-02 LAB — C-REACTIVE PROTEIN

## 2016-02-02 LAB — TSH: TSH: 3.77 u[IU]/mL (ref 0.450–4.500)

## 2016-02-02 LAB — LACTATE DEHYDROGENASE: LDH: 169 U/L (ref 98–192)

## 2016-02-02 NOTE — Progress Notes (Signed)
Patient is here for her results,  Pain in hip area that is dull and achy right side this is new. When she has the pain 5/10.

## 2016-02-02 NOTE — Progress Notes (Signed)
  Oncology Nurse Navigator Documentation Received call from Dr. Rogue Bussing to arrange for appt with Dr. Mortimer Fries for appt next week before scheduled ct scan. Appt has already been arranged for 11/16 at 11:15.  Navigator Location: [facility] (02/02/16 1300)   )                          Barriers/Navigation Needs: Coordination of Care (02/02/16 1300)   Interventions: Coordination of Care (02/02/16 1300)   Coordination of Care: Appts (02/02/16 1300)                  Time Spent with Patient: 15 (02/02/16 1300)

## 2016-02-02 NOTE — Assessment & Plan Note (Addendum)
#   Predominant mediastinal adenopathy/in the context of chronic cough- up to 2 cm in size. Unclear etiology. No clear lung parenchymal masses noted. The differential diagnosis includes benign versus malignant. Awaiting CT of the abdomen pelvis for further evaluation of lymphadenopathy/ target for biopsy.   # If the CT of the abdomen and pelvis does not show any possible targeted for biopsy- EBUS would be reasonable option. The patient's case was discussed at the tumor conference November 9.th. We will refer patient to Dr. Mortimer Fries.    # Will check LDH; ACE levels.   # Follow-up with me in approximately 3 weeks' no labs; sooner if needed.   I reviewed the images myself and with the patient and family in detail.  Thank you Dr.Gilbert for allowing me to participate in the care of your pleasant patient. Please do not hesitate to contact me with questions or concerns in the interim.

## 2016-02-02 NOTE — Progress Notes (Signed)
[place] [facility] NOTE  Patient Care Team: Jerrol Banana., MD as PCP - General (Family Medicine) Robert Bellow, MD (General Surgery) No Pcp Per Patient (General Practice) Maceo Pro, MD as Referring Physician (Obstetrics and Gynecology)  CHIEF COMPLAINTS/PURPOSE OF CONSULTATION: mediastinal adenopathy.  #  NOV 2017- CT chest MEDIASTINAL ADENOPATHY ~2cm   No history exists.     HISTORY OF PRESENTING ILLNESS:  Barbara Frederick 45 y.o.  female with no significant past medical history noted to have a cough for the last 1 year. However she noted to have a cough worsening along with yellowish sputum over the last few months. Given the above complaints patient had a CT scan of the chest that showed moderate mediastinal adenopathy - referred to [place] for further evaluation and recommendations.    She also noted to have dyspnea on exertion the last few months. She has had episodes of chronic night sweats approximately once a month. Otherwise admits to good appetite. Denies any nausea vomiting. Denies any hemoptysis. Denies any chest pain. No headaches. No fevers or chills.  She also noted to have right hip pain 5 on a scale of 10 the last 1 month or so. Otherwise denies any radiating pain down the leg. She traveled to [place]   in June 2017. No history of any  Smoking.   ROS: A complete 10 point review of system is done which is negative except mentioned above in history of present illness  MEDICAL HISTORY:  Past Medical History:  Diagnosis Date  . Allergy   . Anxiety     SURGICAL HISTORY: Past Surgical History:  Procedure Laterality Date  . ABDOMINAL HYSTERECTOMY  2010   Dr Amalia Hailey  . AUGMENTATION MAMMAPLASTY Bilateral 2001  . BREAST ENHANCEMENT SURGERY Bilateral [employer] [facility]  . CYST EXCISION    . OVARY SURGERY     one ovary and cervix at [facility]: Social History   Social History  . Marital status:  Married    Spouse name: N/A  . Number of children: N/A  . Years of education: N/A   Occupational History  . Not on file.   Social History Main Topics  . Smoking status: Never Smoker  . Smokeless tobacco: Never Used  . Alcohol use Yes     Comment: 3-5/week  . Drug use: No  . Sexual activity: Not on file   Other Topics Concern  . Not on file   Social History Narrative  . No narrative on file    FAMILY HISTORY: Family History  Problem Relation Age of Onset  . Cancer Paternal Grandmother 82    breast  . Hypertension Mother   . Emphysema Father   . Hypertension Brother   . Cancer Paternal Aunt 62    breast  . Colon cancer Maternal Grandfather   . Rectal cancer Maternal Grandfather   . Lung cancer Paternal Grandfather     ALLERGIES:  is allergic to codeine.  MEDICATIONS:  Current Outpatient Prescriptions  Medication Sig Dispense Refill  . ALPRAZolam (XANAX) 0.5 MG tablet Take by mouth. Reported on 04/05/2015    . buPROPion (WELLBUTRIN XL) 150 MG 24 hr tablet Take 1 tablet (150 mg total) by mouth daily. 30 tablet 5  . doxycycline (ORACEA) 40 MG capsule TAKE ONE CAPSULE BY MOUTH WITH food AND plenty OF IN FLUID  3  . Multiple Vitamin (MULTI-VITAMINS) TABS Take by mouth.    Marland Kitchen  RHOFADE 1 % CREA APPLY TO THE AFFECTED AREA EVERY MORNING  3  . loratadine (CLARITIN) 10 MG tablet Take by mouth.    . mometasone (NASONEX) 50 MCG/ACT nasal spray Place into the nose.     No current facility-administered medications for this visit.       Marland Kitchen  PHYSICAL EXAMINATION: ECOG PERFORMANCE STATUS: 0 - Asymptomatic  Vitals:   02/02/16 1123  BP: 124/86  Pulse: 68  Resp: 18  Temp: (!) 96.4 F (35.8 C)   Filed Weights   02/02/16 1123  Weight: 160 lb (72.6 kg)    GENERAL: Well-nourished well-developed; Alert, no distress and comfortable.  Accompanied by her husband. EYES: no pallor or icterus OROPHARYNX: no thrush or ulceration; good dentition  NECK: supple, no masses  felt LYMPH:  no palpable lymphadenopathy in the cervical, axillary or inguinal regions LUNGS: clear to auscultation and  No wheeze or crackles HEART/CVS: regular rate & rhythm and no murmurs; No lower extremity edema ABDOMEN: abdomen soft, non-tender and normal bowel sounds Musculoskeletal:no cyanosis of digits and no clubbing  PSYCH: alert & oriented x 3 with fluent speech NEURO: no focal motor/sensory deficits SKIN:  no rashes or significant lesions  LABORATORY DATA:  I have reviewed the data as listed Lab Results  Component Value Date   WBC 6.0 02/01/2016   HGB 13.4 05/17/2014   HCT 40.0 02/01/2016   MCV 89 02/01/2016   PLT 253 02/01/2016    Recent Labs  02/01/16 0734  NA 140  K 4.3  CL 99  CO2 24  GLUCOSE 107*  BUN 13  CREATININE 0.90  CALCIUM 9.2  GFRNONAA 76  GFRAA 88  PROT 7.0  ALBUMIN 4.5  AST 26  ALT 20  ALKPHOS 73  BILITOT 0.3    RADIOGRAPHIC STUDIES: I have personally reviewed the radiological images as listed and agreed with the findings in the report. Dg Chest 2 View  Result Date: 01/23/2016 CLINICAL DATA:  Cough for 8 months. EXAM: CHEST  2 VIEW COMPARISON:  None. FINDINGS: Normal heart size. There is a smooth outward convex opacity overlapping the aortic arch which is not explain by the manubrium. No vertebral or rib abnormality in this region. No tracheal narrowing. Remainder the mediastinal contours are normal. No acute infiltrate or edema. No effusion or pneumothorax. No acute osseous findings. IMPRESSION: 1. No explanation for cough. 2. Unexpected density overlapping the aortic arch which could reflect aberrant vessel, adenopathy or posterior mediastinal mass. If there is outside comparison chest x-ray would gladly make comparison for stability. Otherwise, suggest chest CT. Electronically Signed   By: Monte Fantasia M.D.   On: 01/23/2016 09:39   Ct Chest W Contrast  Result Date: 01/31/2016 CLINICAL DATA:  Productive cough for 1 year EXAM: CT  CHEST WITH CONTRAST TECHNIQUE: Multidetector CT imaging of the chest was performed during intravenous contrast administration. CONTRAST:  55mL ISOVUE-300 IOPAMIDOL (ISOVUE-300) INJECTION 61% COMPARISON:  None. FINDINGS: Cardiovascular: The thoracic aorta is well visualized and shows no evidence of aneurysmal dilatation or dissection. The left vertebral artery arises directly from the aortic arch. The pulmonary artery shows no large central pulmonary embolism. Cardiac structures are within normal limits. No coronary calcifications are seen. Mediastinum/Nodes: The thoracic inlet demonstrates evidence of a 9 mm short axis left supraclavicular lymph node. Multiple mediastinal lymph nodes are identified. A conglomeration of nodes is noted lying between the left common carotid artery and right innominate artery which measures approximately 13 cm in short axis. Multiple smaller lymph  nodes are noted scattered about the origins of the brachiocephalic vessels. A 10-11 mm short axis right peritracheal lymph node is noted as well as a 15 mm pretracheal lymph node. 13 mm AP window node and 19 mm sub carinal node is noted. Multiple small 1 cm and less hilar lymph nodes are identified bilaterally. Lungs/Pleura: The lungs are well aerated bilaterally. A single calcified pleural plaque is noted on the left posteriorly best seen on image number 25 of series 3. No sizable effusion or pneumothorax is noted. Upper Abdomen: No acute abnormality is noted. No lymphadenopathy is seen. Musculoskeletal: Bony structures are within normal limits. Bilateral breast implants are seen. IMPRESSION: Multiple hilar and mediastinal lymph nodes without definitive lung abnormality. These are of uncertain etiology but may represent a chronic change such as sarcoid or possibly lymphoma. CT of the abdomen and pelvis is recommended to assess for any other lymphadenopathy. Bronchoscopic guided tissue sampling may also be helpful. Tiny calcified pleural  plaque on the left. No other focal abnormality is seen. Electronically Signed   By: Inez Catalina M.D.   On: 01/31/2016 14:42   Ct Abdomen Pelvis W Contrast  Result Date: 02/06/2016 CLINICAL DATA:  Follow-up abnormal chest CT. EXAM: CT ABDOMEN AND PELVIS WITH CONTRAST TECHNIQUE: Multidetector CT imaging of the abdomen and pelvis was performed using the standard protocol following bolus administration of intravenous contrast. CONTRAST:  145mL ISOVUE-300 IOPAMIDOL (ISOVUE-300) INJECTION 61% COMPARISON:  Chest CT 01/31/2016 FINDINGS: Lower chest: Lung bases are clear. No effusions. Heart is normal size. Hepatobiliary: No focal hepatic abnormality. Gallbladder unremarkable. Pancreas: No focal abnormality or ductal dilatation. Spleen: No focal abnormality.  Normal size. Adrenals/Urinary Tract: No adrenal abnormality. No focal renal abnormality. No stones or hydronephrosis. Urinary bladder is unremarkable. Stomach/Bowel: Stomach, large and small bowel grossly unremarkable. Vascular/Lymphatic: Aorta and iliac vessels are normal caliber. No retroperitoneal or inguinal adenopathy. Reproductive:  Prior hysterectomy.  No adnexal mass. Other: No free fluid or free air. Musculoskeletal: No acute bony abnormality or focal bone lesion. IMPRESSION: No evidence of adenopathy in the abdomen or pelvis. No acute findings. Electronically Signed   By: Rolm Baptise M.D.   On: 02/06/2016 10:34    ASSESSMENT & PLAN:   Lymphadenopathy, mediastinal # Predominant mediastinal adenopathy/in the context of chronic cough- up to 2 cm in size. Unclear etiology. No clear lung parenchymal masses noted. The differential diagnosis includes benign versus malignant. Awaiting CT of the abdomen pelvis for further evaluation of lymphadenopathy/ target for biopsy.   # If the CT of the abdomen and pelvis does not show any possible targeted for biopsy- EBUS would be reasonable option. The patient's case was discussed at the tumor conference November  9.th. We will refer patient to Dr. Mortimer Fries.    # Will check LDH; ACE levels.   # Follow-up with me in approximately 3 weeks' no labs; sooner if needed.   I reviewed the images myself and with the patient and family in detail.  Thank you Dr.Gilbert for allowing me to participate in the care of your pleasant patient. Please do not hesitate to contact me with questions or concerns in the interim.    Cammie Sickle, MD 02/06/2016 11:17 AM

## 2016-02-03 LAB — ANGIOTENSIN CONVERTING ENZYME: Angiotensin-Converting Enzyme: 66 U/L (ref 14–82)

## 2016-02-06 ENCOUNTER — Ambulatory Visit
Admission: RE | Admit: 2016-02-06 | Discharge: 2016-02-06 | Disposition: A | Discharge status: Home / Self Care | Payer: 59 | Source: Ambulatory Visit | Attending: Family Medicine | Admitting: Family Medicine

## 2016-02-06 ENCOUNTER — Telehealth: Payer: Self-pay

## 2016-02-06 DIAGNOSIS — R918 Other nonspecific abnormal finding of lung field: Secondary | ICD-10-CM | POA: Diagnosis not present

## 2016-02-06 DIAGNOSIS — C8590 Non-Hodgkin lymphoma, unspecified, unspecified site: Secondary | ICD-10-CM | POA: Diagnosis not present

## 2016-02-06 MED ORDER — IOPAMIDOL (ISOVUE-300) INJECTION 61%
100.0000 mL | Freq: Once | INTRAVENOUS | Status: AC | PRN
  Administered 2016-02-06: 100 mL via INTRAVENOUS

## 2016-02-06 NOTE — Telephone Encounter (Signed)
[id number] 

## 2016-02-06 NOTE — Telephone Encounter (Signed)
-----   Message from Jerrol Banana., MD sent at 02/06/2016 10:45 AM EST ----- Abdomen and pelvis are without any adenopathy. Keep appointment with hematology

## 2016-02-06 NOTE — Telephone Encounter (Signed)
Patient has been advised. KW 

## 2016-02-08 ENCOUNTER — Ambulatory Visit: Admission: RE | Admit: 2016-02-08 | Payer: 59 | Source: Ambulatory Visit

## 2016-02-08 ENCOUNTER — Ambulatory Visit (INDEPENDENT_AMBULATORY_CARE_PROVIDER_SITE_OTHER): Payer: 59 | Admitting: Internal Medicine

## 2016-02-08 ENCOUNTER — Encounter: Payer: Self-pay | Admitting: Internal Medicine

## 2016-02-08 VITALS — BP 126/74 | HR 81 | Ht 67.5 in | Wt 164.0 lb

## 2016-02-08 DIAGNOSIS — R599 Enlarged lymph nodes, unspecified: Secondary | ICD-10-CM

## 2016-02-08 DIAGNOSIS — R591 Generalized enlarged lymph nodes: Secondary | ICD-10-CM | POA: Diagnosis not present

## 2016-02-08 DIAGNOSIS — R0602 Shortness of breath: Secondary | ICD-10-CM | POA: Diagnosis not present

## 2016-02-08 NOTE — Patient Instructions (Signed)
PLAN FOR EBUS procedure

## 2016-02-08 NOTE — Progress Notes (Signed)
[name] Consultation      Date: 02/08/2016,   MRN# [id number] Barbara Frederick [facility] 1970-12-21 Code Status:  Code Status History    This patient does not have a recorded code status. Please follow your organizational policy for patients in this situation.     Hosp day:@LENGTHOFSTAYDAYS @ Referring MD: @ATDPROV @     PCP:      AdmissionWeight: 164 lb (74.4 kg)                 CurrentWeight: 164 lb (74.4 kg) Barbara Frederick is a 45 y.o. old female seen in consultation for Adenopathy at the request of Dr. Rogue Bussing     CHIEF COMPLAINT:   cough   HISTORY OF PRESENT ILLNESS   45 yo white female seen today for abnormal CT chest Patient has had cough for 1 year nonproductive, intermittent and some mild worsening so she was worried and saw her PCP Patient wanted to get it checked and PCP ordered CXR which showed "shadows" in her lungs  Patient subsequently had CT chest and found to have extensive mediastinal adenopathy, no other pulm findings noted Images reviewed with patient 02/08/2016   Patient has no fevers, chills, NVD No signs of infection at this time No lower ext swelling  Patient very anxious about CT chest and procedure   The Risks and Benefits of the Bronchoscopy with EBUS were explained to patient/family and I have discussed the risk for acute bleeding, increased chance of infection, increased chance of respiratory failure and cardiac arrest and death. I have also explained to avoid all types of NSAIDs to decrease chance of bleeding, and to avoid food and drinks the midnight prior to procedure.  The procedure consists of a video camera with a light source to be placed and inserted  into the lungs to  look for abnormal tissue and to obtain tissue samples by using needle and biopsy tools.  The patient/family understand the risks and benefits and have agreed to proceed with procedure.   Office SPiro shows NL ratio 80% and FEv1  82%      PAST MEDICAL HISTORY   Past Medical History:  Diagnosis Date  . Allergy   . Anxiety      SURGICAL HISTORY   Past Surgical History:  Procedure Laterality Date  . ABDOMINAL HYSTERECTOMY  2010   Dr Amalia Hailey  . AUGMENTATION MAMMAPLASTY Bilateral 2001  . BREAST ENHANCEMENT SURGERY Bilateral [employer] [facility]  . CYST EXCISION    . OVARY SURGERY     one ovary and cervix at [facility]   Family History  Problem Relation Age of Onset  . Cancer Paternal Grandmother 29    breast  . Hypertension Mother   . Emphysema Father   . Hypertension Brother   . Cancer Paternal Aunt 84    breast  . Colon cancer Maternal Grandfather   . Rectal cancer Maternal Grandfather   . Lung cancer Paternal Grandfather      SOCIAL HISTORY   Social History  Substance Use Topics  . Smoking status: Never Smoker  . Smokeless tobacco: Never Used  . Alcohol use Yes     Comment: 3-5/week     MEDICATIONS    Home Medication:  Current Outpatient Rx  . Order #: [id number] Class: Historical Med  . Order #: [id number] Class: Normal  . Order #: [id number] Class: Historical Med  . Order #: [id number] Class: Historical Med  . Order #:  [id number] Class: Historical Med  . Order #: [id number] Class: Historical Med  . Order #: [id number] Class: Historical Med    Current Medication:  Current Outpatient Prescriptions:  .  ALPRAZolam (XANAX) 0.5 MG tablet, Take by mouth. Reported on 04/05/2015, Disp: , Rfl:  .  buPROPion (WELLBUTRIN XL) 150 MG 24 hr tablet, Take 1 tablet (150 mg total) by mouth daily., Disp: 30 tablet, Rfl: 5 .  doxycycline (ORACEA) 40 MG capsule, TAKE ONE CAPSULE BY MOUTH WITH food AND plenty OF IN FLUID, Disp: , Rfl: 3 .  loratadine (CLARITIN) 10 MG tablet, Take by mouth., Disp: , Rfl:  .  mometasone (NASONEX) 50 MCG/ACT nasal spray, Place into the nose., Disp: , Rfl:  .  Multiple Vitamin (MULTI-VITAMINS) TABS, Take by mouth., Disp: , Rfl:  .  RHOFADE  1 % CREA, APPLY TO THE AFFECTED AREA EVERY MORNING, Disp: , Rfl: 3    ALLERGIES   Codeine     REVIEW OF SYSTEMS   Review of Systems  Constitutional: Negative for chills, diaphoresis, fever, malaise/fatigue and weight loss.  HENT: Negative for congestion and hearing loss.   Eyes: Negative for blurred vision and double vision.  Respiratory: Positive for cough. Negative for hemoptysis, sputum production, shortness of breath and wheezing.   Cardiovascular: Negative for chest pain, palpitations and orthopnea.  Gastrointestinal: Negative for abdominal pain, heartburn, nausea and vomiting.  Genitourinary: Negative for dysuria and urgency.  Musculoskeletal: Negative for back pain, myalgias and neck pain.  Skin: Negative for rash.  Neurological: Negative for dizziness, tingling, tremors, weakness and headaches.  Endo/Heme/Allergies: Does not bruise/bleed easily.  Psychiatric/Behavioral: Negative for depression, substance abuse and suicidal ideas.  All other systems reviewed and are negative.    VS: BP 126/74 (BP Location: Left Arm, Cuff Size: Normal)   Pulse 81   Ht 5' 7.5" (1.715 m)   Wt 164 lb (74.4 kg)   SpO2 97%   BMI 25.31 kg/m      PHYSICAL EXAM  Physical Exam  Constitutional: She is oriented to person, place, and time. She appears well-developed and well-nourished. No distress.  HENT:  Head: Normocephalic and atraumatic.  Mouth/Throat: No oropharyngeal exudate.  Eyes: EOM are normal. Pupils are equal, round, and reactive to light. No scleral icterus.  Neck: Normal range of motion. Neck supple.  Cardiovascular: Normal rate, regular rhythm and normal heart sounds.   No murmur heard. Pulmonary/Chest: No stridor. No respiratory distress. She has no wheezes.  Abdominal: Soft. Bowel sounds are normal.  Musculoskeletal: Normal range of motion. She exhibits no edema.  Neurological: She is alert and oriented to person, place, and time. No cranial nerve deficit.  Skin: Skin  is warm. She is not diaphoretic.  Psychiatric: She has a normal mood and affect.           IMAGING    Dg Chest 2 View  Result Date: 01/23/2016 CLINICAL DATA:  Cough for 8 months. EXAM: CHEST  2 VIEW COMPARISON:  None. FINDINGS: Normal heart size. There is a smooth outward convex opacity overlapping the aortic arch which is not explain by the manubrium. No vertebral or rib abnormality in this region. No tracheal narrowing. Remainder the mediastinal contours are normal. No acute infiltrate or edema. No effusion or pneumothorax. No acute osseous findings. IMPRESSION: 1. No explanation for cough. 2. Unexpected density overlapping the aortic arch which could reflect aberrant vessel, adenopathy or posterior mediastinal mass. If there is outside comparison chest x-ray would gladly make comparison for stability. Otherwise, suggest chest CT.  Electronically Signed   By: Monte Fantasia M.D.   On: 01/23/2016 09:39   Ct Chest W Contrast  Result Date: 01/31/2016 CLINICAL DATA:  Productive cough for 1 year EXAM: CT CHEST WITH CONTRAST TECHNIQUE: Multidetector CT imaging of the chest was performed during intravenous contrast administration. CONTRAST:  85mL ISOVUE-300 IOPAMIDOL (ISOVUE-300) INJECTION 61% COMPARISON:  None. FINDINGS: Cardiovascular: The thoracic aorta is well visualized and shows no evidence of aneurysmal dilatation or dissection. The left vertebral artery arises directly from the aortic arch. The pulmonary artery shows no large central pulmonary embolism. Cardiac structures are within normal limits. No coronary calcifications are seen. Mediastinum/Nodes: The thoracic inlet demonstrates evidence of a 9 mm short axis left supraclavicular lymph node. Multiple mediastinal lymph nodes are identified. A conglomeration of nodes is noted lying between the left common carotid artery and right innominate artery which measures approximately 13 cm in short axis. Multiple smaller lymph nodes are noted  scattered about the origins of the brachiocephalic vessels. A 10-11 mm short axis right peritracheal lymph node is noted as well as a 15 mm pretracheal lymph node. 13 mm AP window node and 19 mm sub carinal node is noted. Multiple small 1 cm and less hilar lymph nodes are identified bilaterally. Lungs/Pleura: The lungs are well aerated bilaterally. A single calcified pleural plaque is noted on the left posteriorly best seen on image number 25 of series 3. No sizable effusion or pneumothorax is noted. Upper Abdomen: No acute abnormality is noted. No lymphadenopathy is seen. Musculoskeletal: Bony structures are within normal limits. Bilateral breast implants are seen. IMPRESSION: Multiple hilar and mediastinal lymph nodes without definitive lung abnormality. These are of uncertain etiology but may represent a chronic change such as sarcoid or possibly lymphoma. CT of the abdomen and pelvis is recommended to assess for any other lymphadenopathy. Bronchoscopic guided tissue sampling may also be helpful. Tiny calcified pleural plaque on the left. No other focal abnormality is seen. Electronically Signed   By: Inez Catalina M.D.   On: 01/31/2016 14:42   Ct Abdomen Pelvis W Contrast  Result Date: 02/06/2016 CLINICAL DATA:  Follow-up abnormal chest CT. EXAM: CT ABDOMEN AND PELVIS WITH CONTRAST TECHNIQUE: Multidetector CT imaging of the abdomen and pelvis was performed using the standard protocol following bolus administration of intravenous contrast. CONTRAST:  125mL ISOVUE-300 IOPAMIDOL (ISOVUE-300) INJECTION 61% COMPARISON:  Chest CT 01/31/2016 FINDINGS: Lower chest: Lung bases are clear. No effusions. Heart is normal size. Hepatobiliary: No focal hepatic abnormality. Gallbladder unremarkable. Pancreas: No focal abnormality or ductal dilatation. Spleen: No focal abnormality.  Normal size. Adrenals/Urinary Tract: No adrenal abnormality. No focal renal abnormality. No stones or hydronephrosis. Urinary bladder is  unremarkable. Stomach/Bowel: Stomach, large and small bowel grossly unremarkable. Vascular/Lymphatic: Aorta and iliac vessels are normal caliber. No retroperitoneal or inguinal adenopathy. Reproductive:  Prior hysterectomy.  No adnexal mass. Other: No free fluid or free air. Musculoskeletal: No acute bony abnormality or focal bone lesion. IMPRESSION: No evidence of adenopathy in the abdomen or pelvis. No acute findings. Electronically Signed   By: Rolm Baptise M.D.   On: 02/06/2016 10:34     Images reviewed with patient 02/08/2016 Adenopathy noted   ASSESSMENT/PLAN   45 yo white female seen today and assessment for Mediastinal Adenopathy. Findings may suggest Sarcoidosis, or Lymphoma.  Patient will need EBUS for further diagnostic evaluation  The Risks and Benefits of the Bronchoscopy with EBUS were explained to patient/family and I have discussed the risk for acute bleeding, increased chance of  infection, increased chance of respiratory failure and cardiac arrest and death. I have also explained to avoid all types of NSAIDs to decrease chance of bleeding, and to avoid food and drinks the midnight prior to procedure.  The procedure consists of a video camera with a light source to be placed and inserted  into the lungs to  look for abnormal tissue and to obtain tissue samples by using needle and biopsy tools.  The patient/family understand the risks and benefits and have agreed to proceed with procedure. Orders placed already    I have personally obtained a history, examined the patient, evaluated laboratory and independently reviewed imaging results, formulated the assessment and plan and placed orders.  The Patient requires high complexity decision making for assessment and support, frequent evaluation and titration of therapies, application of advanced monitoring technologies and extensive interpretation of multiple databases.    Patient/Family are satisfied with Plan of action and  management. All questions answered  Corrin Parker, M.D.  [facility]  Medical Director [place] Director [facility] Cardio-Pulmonary Department

## 2016-02-12 ENCOUNTER — Encounter
Admission: RE | Admit: 2016-02-12 | Discharge: 2016-02-12 | Disposition: A | Discharge status: Home / Self Care | Payer: 59 | Source: Ambulatory Visit | Attending: Internal Medicine | Admitting: Internal Medicine

## 2016-02-12 NOTE — Patient Instructions (Signed)
  Your procedure is scheduled on: 02-13-16 REPORT TO [facility] SAME DAY SURGERY DESK 2ND FLOOR @ 12 PM PER PT  Remember: Instructions that are not followed completely may result in serious medical risk, up to and including death, or upon the discretion of your surgeon and anesthesiologist your surgery may need to be rescheduled.    _x___ 1. Do not eat food or drink liquids after midnight. No gum chewing or hard candies.     __x__ 2. No Alcohol for 24 hours before or after surgery.   __x__3. No Smoking for 24 prior to surgery.   ____  4. Bring all medications with you on the day of surgery if instructed.    __x__ 5. Notify your doctor if there is any change in your medical condition     (cold, fever, infections).     Do not wear jewelry, make-up, hairpins, clips or nail polish.  Do not wear lotions, powders, or perfumes. You may wear deodorant.  Do not shave 48 hours prior to surgery. Men may shave face and neck.  Do not bring valuables to the hospital.    [facility] is not responsible for any belongings or valuables.               Contacts, dentures or bridgework may not be worn into surgery.  Leave your suitcase in the car. After surgery it may be brought to your room.  For patients admitted to the hospital, discharge time is determined by your treatment team.   Patients discharged the day of surgery will not be allowed to drive home.    Please read over the following fact sheets that you were given:   [facility] Preparing for Surgery and or MRSA Information   _x___ Take these medicines the morning of surgery with A SIP OF WATER:    1. XANAX  2.  3.  4.  5.  6.  ____Fleets enema or Magnesium Citrate as directed.   ____ Use CHG Soap or sage wipes as directed on instruction sheet   ____ Use inhalers on the day of surgery and bring to hospital day of surgery  ____ Stop metformin 2 days prior to surgery    ____ Take 1/2 of usual insulin dose the night before surgery  and none on the morning of  surgery.   ____ Stop aspirin or coumadin, or plavix  x__ Stop Anti-inflammatories such as Advil, Aleve, Ibuprofen, Motrin, Naproxen,          Naprosyn, Goodies powders or aspirin products. Ok to take Tylenol.   ____ Stop supplements until after surgery.    ____ Bring C-Pap to the hospital.

## 2016-02-13 ENCOUNTER — Encounter
Admission: RE | Disposition: A | Discharge status: Home / Self Care | Payer: Self-pay | Source: Ambulatory Visit | Attending: Internal Medicine

## 2016-02-13 ENCOUNTER — Ambulatory Visit: Payer: 59

## 2016-02-13 ENCOUNTER — Ambulatory Visit: Payer: 59 | Admitting: Certified Registered Nurse Anesthetist

## 2016-02-13 ENCOUNTER — Ambulatory Visit
Admission: RE | Admit: 2016-02-13 | Discharge: 2016-02-13 | Disposition: A | Discharge status: Home / Self Care | Payer: 59 | Source: Ambulatory Visit | Attending: Internal Medicine | Admitting: Internal Medicine

## 2016-02-13 ENCOUNTER — Encounter: Payer: Self-pay | Admitting: *Deleted

## 2016-02-13 DIAGNOSIS — Z885 Allergy status to narcotic agent status: Secondary | ICD-10-CM | POA: Insufficient documentation

## 2016-02-13 DIAGNOSIS — C771 Secondary and unspecified malignant neoplasm of intrathoracic lymph nodes: Secondary | ICD-10-CM

## 2016-02-13 DIAGNOSIS — K219 Gastro-esophageal reflux disease without esophagitis: Secondary | ICD-10-CM | POA: Insufficient documentation

## 2016-02-13 DIAGNOSIS — C801 Malignant (primary) neoplasm, unspecified: Secondary | ICD-10-CM | POA: Diagnosis not present

## 2016-02-13 DIAGNOSIS — R05 Cough: Secondary | ICD-10-CM | POA: Insufficient documentation

## 2016-02-13 DIAGNOSIS — Z90710 Acquired absence of both cervix and uterus: Secondary | ICD-10-CM | POA: Diagnosis not present

## 2016-02-13 DIAGNOSIS — M1990 Unspecified osteoarthritis, unspecified site: Secondary | ICD-10-CM | POA: Insufficient documentation

## 2016-02-13 DIAGNOSIS — F329 Major depressive disorder, single episode, unspecified: Secondary | ICD-10-CM | POA: Insufficient documentation

## 2016-02-13 DIAGNOSIS — R599 Enlarged lymph nodes, unspecified: Secondary | ICD-10-CM | POA: Diagnosis not present

## 2016-02-13 DIAGNOSIS — Z9889 Other specified postprocedural states: Secondary | ICD-10-CM

## 2016-02-13 DIAGNOSIS — Z0389 Encounter for observation for other suspected diseases and conditions ruled out: Secondary | ICD-10-CM | POA: Diagnosis not present

## 2016-02-13 DIAGNOSIS — F419 Anxiety disorder, unspecified: Secondary | ICD-10-CM | POA: Diagnosis not present

## 2016-02-13 DIAGNOSIS — R591 Generalized enlarged lymph nodes: Secondary | ICD-10-CM | POA: Diagnosis not present

## 2016-02-13 DIAGNOSIS — R0600 Dyspnea, unspecified: Secondary | ICD-10-CM

## 2016-02-13 DIAGNOSIS — R590 Localized enlarged lymph nodes: Secondary | ICD-10-CM | POA: Diagnosis not present

## 2016-02-13 HISTORY — PX: ENDOBRONCHIAL ULTRASOUND: SHX5096

## 2016-02-13 SURGERY — ENDOBRONCHIAL ULTRASOUND (EBUS)
Anesthesia: General

## 2016-02-13 MED ORDER — ROCURONIUM BROMIDE 100 MG/10ML IV SOLN
INTRAVENOUS | Status: DC | PRN
  Administered 2016-02-13: 10 mg via INTRAVENOUS

## 2016-02-13 MED ORDER — SUCCINYLCHOLINE CHLORIDE 20 MG/ML IJ SOLN
INTRAMUSCULAR | Status: DC | PRN
  Administered 2016-02-13 (×2): 80 mg via INTRAVENOUS

## 2016-02-13 MED ORDER — ONDANSETRON HCL 4 MG/2ML IJ SOLN
4.0000 mg | Freq: Once | INTRAMUSCULAR | Status: DC | PRN
Start: 2016-02-13 — End: 2016-02-13

## 2016-02-13 MED ORDER — DEXAMETHASONE SODIUM PHOSPHATE 10 MG/ML IJ SOLN
INTRAMUSCULAR | Status: DC | PRN
  Administered 2016-02-13: 20 mg via INTRAVENOUS

## 2016-02-13 MED ORDER — FENTANYL CITRATE (PF) 100 MCG/2ML IJ SOLN: 25.0000 ug | INTRAMUSCULAR | Status: DC | PRN

## 2016-02-13 MED ORDER — MIDAZOLAM HCL 2 MG/2ML IJ SOLN
INTRAMUSCULAR | Status: DC | PRN
  Administered 2016-02-13: 2 mg via INTRAVENOUS

## 2016-02-13 MED ORDER — LACTATED RINGERS IV SOLN
INTRAVENOUS | Status: DC
  Administered 2016-02-13 (×2): via INTRAVENOUS

## 2016-02-13 MED ORDER — ONDANSETRON HCL 4 MG/2ML IJ SOLN
INTRAMUSCULAR | Status: DC | PRN
  Administered 2016-02-13: 4 mg via INTRAVENOUS

## 2016-02-13 MED ORDER — PROPOFOL 10 MG/ML IV BOLUS
INTRAVENOUS | Status: DC | PRN
  Administered 2016-02-13: 20 mg via INTRAVENOUS
  Administered 2016-02-13: 30 mg via INTRAVENOUS
  Administered 2016-02-13: 150 mg via INTRAVENOUS

## 2016-02-13 MED ORDER — FAMOTIDINE 20 MG PO TABS: 20.0000 mg | ORAL_TABLET | Freq: Once | ORAL | Status: DC

## 2016-02-13 MED ORDER — GLYCOPYRROLATE 0.2 MG/ML IJ SOLN
INTRAMUSCULAR | Status: DC | PRN
  Administered 2016-02-13: 0.2 mg via INTRAVENOUS

## 2016-02-13 MED ORDER — LIDOCAINE HCL (CARDIAC) 20 MG/ML IV SOLN
INTRAVENOUS | Status: DC | PRN
  Administered 2016-02-13: 80 mg via INTRAVENOUS

## 2016-02-13 MED ORDER — FENTANYL CITRATE (PF) 100 MCG/2ML IJ SOLN
INTRAMUSCULAR | Status: DC | PRN
  Administered 2016-02-13: 100 ug via INTRAVENOUS

## 2016-02-13 NOTE — Anesthesia Procedure Notes (Addendum)
Procedure Name: LMA Insertion Performed by: Demetrius Charity Pre-anesthesia Checklist: Patient identified, Patient being monitored, Timeout performed, Emergency Drugs available and Suction available Patient Re-evaluated:Patient Re-evaluated prior to inductionOxygen Delivery Method: Circle system utilized Preoxygenation: Pre-oxygenation with 100% oxygen Intubation Type: IV induction Ventilation: Mask ventilation without difficulty LMA: LMA inserted LMA Size: 5.0 Tube type: Oral Number of attempts: 1 Placement Confirmation: positive ETCO2 and breath sounds checked- equal and bilateral Tube secured with: Tape Dental Injury: Teeth and Oropharynx as per pre-operative assessment  Comments: DL with Mac 3 by CRNA, Grade 2 view.  Bougie placed through cords, however, unable to pass 8.5 or 8.0 ETT over bougie.  DL by MDA with same results.  Decision to proceed with #5 LMA.

## 2016-02-13 NOTE — Transfer of Care (Signed)
Immediate Anesthesia Transfer of Care Note  Patient: Barbara Frederick  Procedure(s) Performed: Procedure(s): ENDOBRONCHIAL ULTRASOUND (N/A)  Patient Location: PACU  Anesthesia Type:General  Level of Consciousness: sedated  Airway & Oxygen Therapy: Patient Spontanous Breathing and Patient connected to face mask oxygen  Post-op Assessment: Report given to RN and Post -op Vital signs reviewed and stable  Post vital signs: Reviewed and stable  Last Vitals:  Vitals:   02/13/16 1212 02/13/16 1430  BP: 127/85 100/66  Pulse: 76 72  Resp: 16 14  Temp: 36.6 C 36.3 C    Last Pain:  Vitals:   02/13/16 1212  TempSrc: Tympanic         Complications: No apparent anesthesia complications

## 2016-02-13 NOTE — Anesthesia Preprocedure Evaluation (Addendum)
Anesthesia Evaluation  Patient identified by MRN, date of birth, ID band Patient awake    Reviewed: Allergy & Precautions, NPO status , Patient's Chart, lab work & pertinent test results  Airway Mallampati: II       Dental   Pulmonary  Mediastinal adenopathy   Pulmonary exam normal        Cardiovascular negative cardio ROS Normal cardiovascular exam     Neuro/Psych PSYCHIATRIC DISORDERS Anxiety Depression negative neurological ROS     GI/Hepatic Neg liver ROS, GERD  ,  Endo/Other  negative endocrine ROS  Renal/GU negative Renal ROS  negative genitourinary   Musculoskeletal  (+) Arthritis , Osteoarthritis,    Abdominal Normal abdominal exam  (+)   Peds negative pediatric ROS (+)  Hematology negative hematology ROS (+)   Anesthesia Other Findings Past Medical History: No date: Allergy No date: Anxiety Abnormal CT of chest... Mediastinal adenopathy  Reproductive/Obstetrics                            Anesthesia Physical Anesthesia Plan  ASA: II  Anesthesia Plan: General   Post-op Pain Management:    Induction: Intravenous  Airway Management Planned: Oral ETT  Additional Equipment:   Intra-op Plan:   Post-operative Plan: Extubation in OR  Informed Consent: I have reviewed the patients History and Physical, chart, labs and discussed the procedure including the risks, benefits and alternatives for the proposed anesthesia with the patient or authorized representative who has indicated his/her understanding and acceptance.   Dental advisory given  Plan Discussed with: Surgeon and CRNA  Anesthesia Plan Comments:         Anesthesia Quick Evaluation

## 2016-02-13 NOTE — H&P (View-Only) (Signed)
[place] [facility] Consultation      Date: 02/08/2016,   MRN# [id number] Barbara Frederick [facility] Mar 31, 1970 Code Status:  Code Status History    This patient does not have a recorded code status. Please follow your organizational policy for patients in this situation.     Hosp day:@LENGTHOFSTAYDAYS @ Referring MD: @ATDPROV @     PCP:      AdmissionWeight: 164 lb (74.4 kg)                 CurrentWeight: 164 lb (74.4 kg) Barbara Frederick is a 45 y.o. old female seen in consultation for Adenopathy at the request of Dr. Rogue Bussing     CHIEF COMPLAINT:   cough   HISTORY OF PRESENT ILLNESS   45 yo white female seen today for abnormal CT chest Patient has had cough for 1 year nonproductive, intermittent and some mild worsening so she was worried and saw her PCP Patient wanted to get it checked and PCP ordered CXR which showed "shadows" in her lungs  Patient subsequently had CT chest and found to have extensive mediastinal adenopathy, no other pulm findings noted Images reviewed with patient 02/08/2016   Patient has no fevers, chills, NVD No signs of infection at this time No lower ext swelling  Patient very anxious about CT chest and procedure   The Risks and Benefits of the Bronchoscopy with EBUS were explained to patient/family and I have discussed the risk for acute bleeding, increased chance of infection, increased chance of respiratory failure and cardiac arrest and death. I have also explained to avoid all types of NSAIDs to decrease chance of bleeding, and to avoid food and drinks the midnight prior to procedure.  The procedure consists of a video camera with a light source to be placed and inserted  into the lungs to  look for abnormal tissue and to obtain tissue samples by using needle and biopsy tools.  The patient/family understand the risks and benefits and have agreed to proceed with procedure.   Office SPiro shows NL ratio 80% and FEv1  82%      PAST MEDICAL HISTORY   Past Medical History:  Diagnosis Date  . Allergy   . Anxiety      SURGICAL HISTORY   Past Surgical History:  Procedure Laterality Date  . ABDOMINAL HYSTERECTOMY  2010   Dr Amalia Hailey  . AUGMENTATION MAMMAPLASTY Bilateral 2001  . BREAST ENHANCEMENT SURGERY Bilateral [employer]plants  . CYST EXCISION    . OVARY SURGERY     one ovary and cervix at [facility]   Family History  Problem Relation Age of Onset  . Cancer Paternal Grandmother 9    breast  . Hypertension Mother   . Emphysema Father   . Hypertension Brother   . Cancer Paternal Aunt 73    breast  . Colon cancer Maternal Grandfather   . Rectal cancer Maternal Grandfather   . Lung cancer Paternal Grandfather      SOCIAL HISTORY   Social History  Substance Use Topics  . Smoking status: Never Smoker  . Smokeless tobacco: Never Used  . Alcohol use Yes     Comment: 3-5/week     MEDICATIONS    Home Medication:  Current Outpatient Rx  . Order #: [id number] Class: Historical Med  . Order #: [id number] Class: Normal  . Order #: [id number] Class: Historical Med  . Order #: [id number] Class: Historical Med  . Order #:  [id number] Class: Historical Med  . Order #: [id number] Class: Historical Med  . Order #: [id number] Class: Historical Med    Current Medication:  Current Outpatient Prescriptions:  .  ALPRAZolam (XANAX) 0.5 MG tablet, Take by mouth. Reported on 04/05/2015, Disp: , Rfl:  .  buPROPion (WELLBUTRIN XL) 150 MG 24 hr tablet, Take 1 tablet (150 mg total) by mouth daily., Disp: 30 tablet, Rfl: 5 .  doxycycline (ORACEA) 40 MG capsule, TAKE ONE CAPSULE BY MOUTH WITH food AND plenty OF IN FLUID, Disp: , Rfl: 3 .  loratadine (CLARITIN) 10 MG tablet, Take by mouth., Disp: , Rfl:  .  mometasone (NASONEX) 50 MCG/ACT nasal spray, Place into the nose., Disp: , Rfl:  .  Multiple Vitamin (MULTI-VITAMINS) TABS, Take by mouth., Disp: , Rfl:  .  RHOFADE  1 % CREA, APPLY TO THE AFFECTED AREA EVERY MORNING, Disp: , Rfl: 3    ALLERGIES   Codeine     REVIEW OF SYSTEMS   Review of Systems  Constitutional: Negative for chills, diaphoresis, fever, malaise/fatigue and weight loss.  HENT: Negative for congestion and hearing loss.   Eyes: Negative for blurred vision and double vision.  Respiratory: Positive for cough. Negative for hemoptysis, sputum production, shortness of breath and wheezing.   Cardiovascular: Negative for chest pain, palpitations and orthopnea.  Gastrointestinal: Negative for abdominal pain, heartburn, nausea and vomiting.  Genitourinary: Negative for dysuria and urgency.  Musculoskeletal: Negative for back pain, myalgias and neck pain.  Skin: Negative for rash.  Neurological: Negative for dizziness, tingling, tremors, weakness and headaches.  Endo/Heme/Allergies: Does not bruise/bleed easily.  Psychiatric/Behavioral: Negative for depression, substance abuse and suicidal ideas.  All other systems reviewed and are negative.    VS: BP 126/74 (BP Location: Left Arm, Cuff Size: Normal)   Pulse 81   Ht 5' 7.5" (1.715 m)   Wt 164 lb (74.4 kg)   SpO2 97%   BMI 25.31 kg/m      PHYSICAL EXAM  Physical Exam  Constitutional: She is oriented to person, place, and time. She appears well-developed and well-nourished. No distress.  HENT:  Head: Normocephalic and atraumatic.  Mouth/Throat: No oropharyngeal exudate.  Eyes: EOM are normal. Pupils are equal, round, and reactive to light. No scleral icterus.  Neck: Normal range of motion. Neck supple.  Cardiovascular: Normal rate, regular rhythm and normal heart sounds.   No murmur heard. Pulmonary/Chest: No stridor. No respiratory distress. She has no wheezes.  Abdominal: Soft. Bowel sounds are normal.  Musculoskeletal: Normal range of motion. She exhibits no edema.  Neurological: She is alert and oriented to person, place, and time. No cranial nerve deficit.  Skin: Skin  is warm. She is not diaphoretic.  Psychiatric: She has a normal mood and affect.           IMAGING    Dg Chest 2 View  Result Date: 01/23/2016 CLINICAL DATA:  Cough for 8 months. EXAM: CHEST  2 VIEW COMPARISON:  None. FINDINGS: Normal heart size. There is a smooth outward convex opacity overlapping the aortic arch which is not explain by the manubrium. No vertebral or rib abnormality in this region. No tracheal narrowing. Remainder the mediastinal contours are normal. No acute infiltrate or edema. No effusion or pneumothorax. No acute osseous findings. IMPRESSION: 1. No explanation for cough. 2. Unexpected density overlapping the aortic arch which could reflect aberrant vessel, adenopathy or posterior mediastinal mass. If there is outside comparison chest x-ray would gladly make comparison for stability. Otherwise, suggest chest CT.  Electronically Signed   By: Monte Fantasia M.D.   On: 01/23/2016 09:39   Ct Chest W Contrast  Result Date: 01/31/2016 CLINICAL DATA:  Productive cough for 1 year EXAM: CT CHEST WITH CONTRAST TECHNIQUE: Multidetector CT imaging of the chest was performed during intravenous contrast administration. CONTRAST:  76mL ISOVUE-300 IOPAMIDOL (ISOVUE-300) INJECTION 61% COMPARISON:  None. FINDINGS: Cardiovascular: The thoracic aorta is well visualized and shows no evidence of aneurysmal dilatation or dissection. The left vertebral artery arises directly from the aortic arch. The pulmonary artery shows no large central pulmonary embolism. Cardiac structures are within normal limits. No coronary calcifications are seen. Mediastinum/Nodes: The thoracic inlet demonstrates evidence of a 9 mm short axis left supraclavicular lymph node. Multiple mediastinal lymph nodes are identified. A conglomeration of nodes is noted lying between the left common carotid artery and right innominate artery which measures approximately 13 cm in short axis. Multiple smaller lymph nodes are noted  scattered about the origins of the brachiocephalic vessels. A 10-11 mm short axis right peritracheal lymph node is noted as well as a 15 mm pretracheal lymph node. 13 mm AP window node and 19 mm sub carinal node is noted. Multiple small 1 cm and less hilar lymph nodes are identified bilaterally. Lungs/Pleura: The lungs are well aerated bilaterally. A single calcified pleural plaque is noted on the left posteriorly best seen on image number 25 of series 3. No sizable effusion or pneumothorax is noted. Upper Abdomen: No acute abnormality is noted. No lymphadenopathy is seen. Musculoskeletal: Bony structures are within normal limits. Bilateral breast implants are seen. IMPRESSION: Multiple hilar and mediastinal lymph nodes without definitive lung abnormality. These are of uncertain etiology but may represent a chronic change such as sarcoid or possibly lymphoma. CT of the abdomen and pelvis is recommended to assess for any other lymphadenopathy. Bronchoscopic guided tissue sampling may also be helpful. Tiny calcified pleural plaque on the left. No other focal abnormality is seen. Electronically Signed   By: Inez Catalina M.D.   On: 01/31/2016 14:42   Ct Abdomen Pelvis W Contrast  Result Date: 02/06/2016 CLINICAL DATA:  Follow-up abnormal chest CT. EXAM: CT ABDOMEN AND PELVIS WITH CONTRAST TECHNIQUE: Multidetector CT imaging of the abdomen and pelvis was performed using the standard protocol following bolus administration of intravenous contrast. CONTRAST:  146mL ISOVUE-300 IOPAMIDOL (ISOVUE-300) INJECTION 61% COMPARISON:  Chest CT 01/31/2016 FINDINGS: Lower chest: Lung bases are clear. No effusions. Heart is normal size. Hepatobiliary: No focal hepatic abnormality. Gallbladder unremarkable. Pancreas: No focal abnormality or ductal dilatation. Spleen: No focal abnormality.  Normal size. Adrenals/Urinary Tract: No adrenal abnormality. No focal renal abnormality. No stones or hydronephrosis. Urinary bladder is  unremarkable. Stomach/Bowel: Stomach, large and small bowel grossly unremarkable. Vascular/Lymphatic: Aorta and iliac vessels are normal caliber. No retroperitoneal or inguinal adenopathy. Reproductive:  Prior hysterectomy.  No adnexal mass. Other: No free fluid or free air. Musculoskeletal: No acute bony abnormality or focal bone lesion. IMPRESSION: No evidence of adenopathy in the abdomen or pelvis. No acute findings. Electronically Signed   By: Rolm Baptise M.D.   On: 02/06/2016 10:34     Images reviewed with patient 02/08/2016 Adenopathy noted   ASSESSMENT/PLAN   45 yo white female seen today and assessment for Mediastinal Adenopathy. Findings may suggest Sarcoidosis, or Lymphoma.  Patient will need EBUS for further diagnostic evaluation  The Risks and Benefits of the Bronchoscopy with EBUS were explained to patient/family and I have discussed the risk for acute bleeding, increased chance of  infection, increased chance of respiratory failure and cardiac arrest and death. I have also explained to avoid all types of NSAIDs to decrease chance of bleeding, and to avoid food and drinks the midnight prior to procedure.  The procedure consists of a video camera with a light source to be placed and inserted  into the lungs to  look for abnormal tissue and to obtain tissue samples by using needle and biopsy tools.  The patient/family understand the risks and benefits and have agreed to proceed with procedure. Orders placed already    I have personally obtained a history, examined the patient, evaluated laboratory and independently reviewed imaging results, formulated the assessment and plan and placed orders.  The Patient requires high complexity decision making for assessment and support, frequent evaluation and titration of therapies, application of advanced monitoring technologies and extensive interpretation of multiple databases.    Patient/Family are satisfied with Plan of action and  management. All questions answered  Corrin Parker, M.D.  [facility]  Medical Director [place] Director [facility] Cardio-Pulmonary Department

## 2016-02-13 NOTE — Procedures (Signed)
  [facility]            Bronchoscopy Note   FINDINGS/SUMMARY:  -Difficult intubation. -EBUS guided biopsies taken of subcarinal node, right paratracheal node, right hilar node all with good returns. -Minimal erythema throughout both airways, normal airway anatomy, with no endobronchial lesions noted.  Indication: Mediastinal lymphadenopathy. The patient (or their representative) was informed of the risks (including but not limited to bleeding, infection, respiratory failure, lung injury, tooth/oral injury) and benefits of the procedure and gave consent, see chart.   Pre-op diagnosis: Mediastinal lymphadenopathy. Post-op diagnosis: Same Estimated blood loss: Minimal  Medications for procedure: See anesthesia notes.  Procedure description:  The patient was brought to the bronchoscopy suite, anesthesia was provided by anesthesia services, please see their note for further details. Endotracheal intubation was attempted by anesthesia, an 8.0 tube could not be passed through the cords even after placement guiding catheter, subsequently, an LMA device was used. -. The endobronchial ultrasound was passed via the vocal cords, which appeared to be narrowed, to the trachea. The scope was taken to the subcarinal nodes were 3 passes were taken with good returns. The scope was then taken up to the left paratracheal nodes, however, significant contact with the left tracheal mucosa could not be maintained to obtain a past year, therefore passes were obtained from the right paratracheal lymph node 2 with good returns. Subsequently, the bronchoscope was taken to the right hilar lymph node with a single pass take 1 with good returns. The EBUS scope was then removed, the white light bronchoscope was advanced and anatomical tour was undertaken. No endobronchial lesions were noted, there was mild erythema and mild mucosal secretions throughout both lungs, no anatomical defects or  abnormalities were noted. The bronchoscope was then wedged into the right middle lobe where BAL was obtained and sent for pathology and microbiology. She was then extubated and taken to recovery, postoperative chest x-ray is currently pending.   Condition post procedure: Stable   Complications: None noted  Patient instructions: Patient was given instructions on to call the office if she notes a fever or coughing up blood that is severe or lasting greater than 24 hours. Family was given my card with the appropriate number.   Marda Stalker, MD.  Board Certified in Internal Medicine, Pulmonary Medicine, [place], and Sleep Medicine.  [facility] Office Number: [phone]  Patricia Pesa, M.D.  Vilinda Boehringer, M.D.  Cheral Marker, M.D  02/13/2016

## 2016-02-13 NOTE — Interval H&P Note (Signed)
History and Physical Interval Note:  02/13/2016 1:07 PM  Barbara Frederick  has presented today for surgery, with the diagnosis of adenopathy  The various methods of treatment have been discussed with the patient and family. After consideration of risks, benefits and other options for treatment, the patient has consented to  Procedure(s): ENDOBRONCHIAL ULTRASOUND (N/A) as a surgical intervention .  The patient's history has been reviewed, patient examined, no change in status, stable for surgery.  I have reviewed the patient's chart and labs.  Questions were answered to the patient's satisfaction.     Laverle Hobby

## 2016-02-13 NOTE — Discharge Instructions (Signed)

## 2016-02-13 NOTE — Anesthesia Postprocedure Evaluation (Signed)
Anesthesia Post Note  Patient: Barbara Frederick  Procedure(s) Performed: Procedure(s) (LRB): ENDOBRONCHIAL ULTRASOUND (N/A)  Patient location during evaluation: PACU Anesthesia Type: General Level of consciousness: awake and alert and oriented Pain management: pain level controlled Vital Signs Assessment: post-procedure vital signs reviewed and stable Respiratory status: spontaneous breathing, nonlabored ventilation and respiratory function stable Cardiovascular status: blood pressure returned to baseline and stable Postop Assessment: no signs of nausea or vomiting Anesthetic complications: no    Last Vitals:  Vitals:   02/13/16 1430 02/13/16 1445  BP: 100/66 113/84  Pulse: 72 (!) 108  Resp: 14 18  Temp: 36.3 C     Last Pain:  Vitals:   02/13/16 1212  TempSrc: Tympanic                 Denise Bramblett

## 2016-02-14 ENCOUNTER — Encounter: Payer: Self-pay | Admitting: Internal Medicine

## 2016-02-14 LAB — CYTOLOGY - NON PAP

## 2016-02-14 LAB — ACID FAST SMEAR (AFB): ACID FAST SMEAR - AFSCU2: NEGATIVE

## 2016-02-14 LAB — ACID FAST SMEAR (AFB, MYCOBACTERIA)

## 2016-02-16 LAB — CULTURE, BAL-QUANTITATIVE: CULTURE: NORMAL — AB

## 2016-02-16 LAB — CULTURE, BAL-QUANTITATIVE W GRAM STAIN

## 2016-02-19 ENCOUNTER — Ambulatory Visit (INDEPENDENT_AMBULATORY_CARE_PROVIDER_SITE_OTHER): Payer: 59 | Admitting: Family Medicine

## 2016-02-19 ENCOUNTER — Telehealth: Payer: Self-pay | Admitting: Internal Medicine

## 2016-02-19 VITALS — BP 112/60 | HR 84 | Temp 98.0°F | Resp 16 | Wt 163.0 lb

## 2016-02-19 DIAGNOSIS — F419 Anxiety disorder, unspecified: Secondary | ICD-10-CM

## 2016-02-19 MED ORDER — ALPRAZOLAM 0.5 MG PO TABS: 0.5000 mg | ORAL_TABLET | Freq: Two times a day (BID) | ORAL | 5 refills | Status: DC | PRN

## 2016-02-19 NOTE — Telephone Encounter (Signed)
Pt would like biopsy results. Please call.

## 2016-02-19 NOTE — Telephone Encounter (Signed)
Pt had bronch on 02/13/16 and is requesting these results. I have made pt aware that these results can take up to a week to get back sometimes longer. Pt voiced understanding and had no further questions.  DR please advise. Thanks.

## 2016-02-19 NOTE — Progress Notes (Signed)
Barbara Frederick  MRN: [id number] DOB: 1970-08-06  Subjective:  HPI  The patient is a 45 year old female who presents for follow up of her depression.  She was last seen on 01/22/16 and was started on Bupropion.  The patient states she thinks she is doing better on the medicine and considering all that she has been through in the last month.  She and her husband both have noticed that she has been more positive.  She states that she has had to use a little more of the Xanax due to her current circumstances. She has since that visit been Pulmonology, cardiology and oncology.  She has also had bronchoscopy.  The patient states she has not gotten the results of the testing and wanted to see if you were able to give them to her.    Patient Active Problem List   Diagnosis Date Noted  . Lymphadenopathy, mediastinal 02/02/2016  . Cough 01/22/2016  . Anxiety 09/02/2014  . Arthritis 09/02/2014  . Clinical depression 09/02/2014  . Gastro-esophageal reflux disease without esophagitis 09/02/2014  . Insomnia 09/02/2014  . Allergic rhinitis, seasonal 09/02/2014  . Breast pain, left 05/11/2014  . Umbilical hernia 0000000  . Abdominal wall mass of periumbilical region 0000000    Past Medical History:  Diagnosis Date  . Allergy   . Anxiety     Social History   Social History  . Marital status: Married    Spouse name: N/A  . Number of children: N/A  . Years of education: N/A   Occupational History  . Not on file.   Social History Main Topics  . Smoking status: Never Smoker  . Smokeless tobacco: Never Used  . Alcohol use Yes     Comment: 3-5/week  . Drug use: No  . Sexual activity: Not on file   Other Topics Concern  . Not on file   Social History Narrative  . No narrative on file    Outpatient Encounter Prescriptions as of 02/19/2016  Medication Sig  . ALPRAZolam (XANAX) 0.5 MG tablet Take 0.5 mg by mouth at bedtime as needed for anxiety. Reported on 04/05/2015    . buPROPion (WELLBUTRIN XL) 150 MG 24 hr tablet Take 1 tablet (150 mg total) by mouth daily.  Marland Kitchen doxycycline (ORACEA) 40 MG capsule TAKE ONE CAPSULE BY MOUTH WITH food AND plenty OF IN FLUID  . loratadine (CLARITIN) 10 MG tablet Take 10 mg by mouth daily.   . Multiple Vitamin (MULTI-VITAMINS) TABS Take 1 tablet by mouth daily.   . RHOFADE 1 % CREA APPLY TO THE AFFECTED AREA EVERY MORNING   No facility-administered encounter medications on file as of 02/19/2016.     Allergies  Allergen Reactions  . Codeine Itching    itching    Review of Systems  Constitutional: Negative for fever and malaise/fatigue.  Respiratory: Positive for cough (worsened by procedure.). Negative for shortness of breath and wheezing.   Cardiovascular: Negative for chest pain and palpitations.  Neurological: Negative for dizziness, weakness and headaches.  Psychiatric/Behavioral: Positive for depression. Negative for hallucinations, memory loss, substance abuse and suicidal ideas. The patient is nervous/anxious and has insomnia.     Objective:  BP 112/60 (BP Location: Right Arm, Patient Position: Sitting, Cuff Size: Normal)   Pulse 84   Temp 98 F (36.7 C) (Oral)   Resp 16   Wt 163 lb (73.9 kg)   BMI 25.15 kg/m   Physical Exam  Constitutional: She is well-developed, well-nourished, and in  no distress.  HENT:  Head: Normocephalic and atraumatic.  Eyes: Pupils are equal, round, and reactive to light.  Neck: Normal range of motion. Neck supple.  Cardiovascular: Normal rate, regular rhythm and normal heart sounds.   Pulmonary/Chest: Effort normal and breath sounds normal.  Abdominal: Soft.  Skin: Skin is warm and dry.  Psychiatric: Mood, memory, affect and judgment normal.    Assessment and Plan :  1. Acute anxiety  - ALPRAZolam (XANAX) 0.5 MG tablet; Take 1 tablet (0.5 mg total) by mouth 2 (two) times daily as needed for anxiety. Reported on 04/05/2015  Dispense: 60 tablet; Refill: 5 2.Abnormal Chest  CT/Lymphadenopathy Workup to this point is negative. Initial brushings from bronchoscopy are negative to this point. Some are still pending.   HPI, Exam and A&P Transcribed under the direction and in the presence of Miguel Aschoff, Brooke Bonito., MD. Electronically Signed: Althea Charon, RMA I have done the exam and reviewed the chart and it is accurate to the best of my knowledge. Development worker, community has been used and  any errors in dictation or transcription are unintentional. Miguel Aschoff M.D. [facility]

## 2016-02-20 NOTE — Telephone Encounter (Signed)
I am forwarding to DK, as he saw the patient in clinic, looks like everything is coming back benign so far.

## 2016-02-21 LAB — VIRUS CULTURE

## 2016-02-23 ENCOUNTER — Inpatient Hospital Stay: Payer: 59 | Attending: Internal Medicine | Admitting: Internal Medicine

## 2016-02-23 ENCOUNTER — Encounter: Payer: Self-pay | Admitting: Cardiothoracic Surgery

## 2016-02-23 ENCOUNTER — Ambulatory Visit (INDEPENDENT_AMBULATORY_CARE_PROVIDER_SITE_OTHER): Payer: 59 | Admitting: Cardiothoracic Surgery

## 2016-02-23 VITALS — BP 134/87 | HR 84 | Temp 98.7°F | Resp 20 | Ht 68.0 in | Wt 161.8 lb

## 2016-02-23 VITALS — BP 132/84 | HR 89 | Temp 97.2°F | Resp 18 | Wt 160.6 lb

## 2016-02-23 DIAGNOSIS — Z79899 Other long term (current) drug therapy: Secondary | ICD-10-CM | POA: Insufficient documentation

## 2016-02-23 DIAGNOSIS — Z9882 Breast implant status: Secondary | ICD-10-CM | POA: Diagnosis not present

## 2016-02-23 DIAGNOSIS — R590 Localized enlarged lymph nodes: Secondary | ICD-10-CM | POA: Diagnosis not present

## 2016-02-23 DIAGNOSIS — Z90710 Acquired absence of both cervix and uterus: Secondary | ICD-10-CM | POA: Insufficient documentation

## 2016-02-23 DIAGNOSIS — F419 Anxiety disorder, unspecified: Secondary | ICD-10-CM | POA: Diagnosis not present

## 2016-02-23 DIAGNOSIS — R05 Cough: Secondary | ICD-10-CM | POA: Diagnosis not present

## 2016-02-23 NOTE — Assessment & Plan Note (Signed)
#   Predominant mediastinal adenopathy/in the context of chronic cough- up to 2 cm in size.  CT abdomen pelvis negative for lymphadenopathy. Bronchoscopy biopsy negative for malignancy/or sarcoid/fungal and viral infections.  # Discussed the option of repeating a scan in 3-6 months versus mediastinoscopy. Patient concerned about underlying malignancy/insurance; interested in proceeding with the next step in diagnosis. We will make a referral to Dr. Faith Rogue. Discussed with Dr. Mortimer Fries  # Patient follow-up with me in 1 month.

## 2016-02-23 NOTE — Progress Notes (Signed)
Patient is here for follow up, she is anxious about results.

## 2016-02-23 NOTE — Progress Notes (Signed)
[place] NOTE  Patient Care Team: Jerrol Banana., MD as PCP - General (Family Medicine) Robert Bellow, MD (General Surgery) No Pcp Per Patient (General Practice) Maceo Pro, MD as Referring Physician (Obstetrics and Gynecology)  CHIEF COMPLAINTS/PURPOSE OF CONSULTATION: mediastinal adenopathy.  #  NOV 2017- CT chest MEDIASTINAL ADENOPATHY ~2cm; [Dr.Kasa; EBUS Bx- Dr.Ram-NEG]; Recm Eval with Dr.Okas.   No history exists.     HISTORY OF PRESENTING ILLNESS:  Princes Sabbath 45 y.o.  female without any prior history of smoking; noted to have mediastinal adenopathy/as noted above on a CT scan done for progressive cough. Patient is here to review the results of her EBUS/biopsy.  Patient continues to mild cough. Otherwise no worsening shortness of breath or hemoptysis. No weight loss. Patient is very anxious.  ROS: A complete 10 point review of system is done which is negative except mentioned above in history of present illness  MEDICAL HISTORY:  Past Medical History:  Diagnosis Date  . Allergy   . Anxiety     SURGICAL HISTORY: Past Surgical History:  Procedure Laterality Date  . ABDOMINAL HYSTERECTOMY  2010   Dr Amalia Hailey  . AUGMENTATION MAMMAPLASTY Bilateral 2001  . BREAST ENHANCEMENT SURGERY Bilateral [employer] Implants  . CYST EXCISION    . ENDOBRONCHIAL ULTRASOUND N/A 02/13/2016   Procedure: ENDOBRONCHIAL ULTRASOUND;  Surgeon: Laverle Hobby, MD;  Location: [facility] ORS;  Service: Cardiopulmonary;  Laterality: N/A;  . OVARY SURGERY     one ovary and cervix at [facility]: Social History   Social History  . Marital status: Married    Spouse name: N/A  . Number of children: N/A  . Years of education: N/A   Occupational History  . Not on file.   Social History Main Topics  . Smoking status: Never Smoker  . Smokeless tobacco: Never Used  . Alcohol use Yes     Comment: 3-5/week  . Drug  use: No  . Sexual activity: Not on file   Other Topics Concern  . Not on file   Social History Narrative  . No narrative on file    FAMILY HISTORY: Family History  Problem Relation Age of Onset  . Cancer Paternal Grandmother 28    breast  . Hypertension Mother   . Emphysema Father   . Hypertension Brother   . Cancer Paternal Aunt 85    breast  . Colon cancer Maternal Grandfather   . Rectal cancer Maternal Grandfather   . Lung cancer Paternal Grandfather     ALLERGIES:  is allergic to codeine.  MEDICATIONS:  Current Outpatient Prescriptions  Medication Sig Dispense Refill  . ALPRAZolam (XANAX) 0.5 MG tablet Take 1 tablet (0.5 mg total) by mouth 2 (two) times daily as needed for anxiety. Reported on 04/05/2015 60 tablet 5  . buPROPion (WELLBUTRIN XL) 150 MG 24 hr tablet Take 1 tablet (150 mg total) by mouth daily. 30 tablet 5  . doxycycline (ORACEA) 40 MG capsule TAKE ONE CAPSULE BY MOUTH WITH food AND plenty OF IN FLUID  3  . loratadine (CLARITIN) 10 MG tablet Take 10 mg by mouth daily.     . Multiple Vitamin (MULTI-VITAMINS) TABS Take 1 tablet by mouth daily.     . RHOFADE 1 % CREA APPLY TO THE AFFECTED AREA EVERY MORNING  3  . mometasone (NASONEX) 50 MCG/ACT nasal spray Place 2 sprays into the nose daily.  No current facility-administered medications for this visit.       Marland Kitchen  PHYSICAL EXAMINATION: ECOG PERFORMANCE STATUS: 0 - Asymptomatic  Vitals:   02/23/16 0909  BP: 132/84  Pulse: 89  Resp: 18  Temp: 97.2 F (36.2 C)   Filed Weights   02/23/16 0909  Weight: 160 lb 9.6 oz (72.8 kg)    GENERAL: Well-nourished well-developed; Alert, no distress and comfortable.  She is alone.  EYES: no pallor or icterus OROPHARYNX: no thrush or ulceration; good dentition  NECK: supple, no masses felt LYMPH:  no palpable lymphadenopathy in the cervical, axillary or inguinal regions LUNGS: clear to auscultation and  No wheeze or crackles HEART/CVS: regular rate &  rhythm and no murmurs; No lower extremity edema ABDOMEN: abdomen soft, non-tender and normal bowel sounds Musculoskeletal:no cyanosis of digits and no clubbing  PSYCH: alert & oriented x 3 with fluent speech NEURO: no focal motor/sensory deficits SKIN:  no rashes or significant lesions  LABORATORY DATA:  I have reviewed the data as listed Lab Results  Component Value Date   WBC 6.0 02/01/2016   HGB 13.4 05/17/2014   HCT 40.0 02/01/2016   MCV 89 02/01/2016   PLT 253 02/01/2016    Recent Labs  02/01/16 0734  NA 140  K 4.3  CL 99  CO2 24  GLUCOSE 107*  BUN 13  CREATININE 0.90  CALCIUM 9.2  GFRNONAA 76  GFRAA 88  PROT 7.0  ALBUMIN 4.5  AST 26  ALT 20  ALKPHOS 73  BILITOT 0.3    RADIOGRAPHIC STUDIES: I have personally reviewed the radiological images as listed and agreed with the findings in the report. Dg Chest 1 View  Result Date: 02/13/2016 CLINICAL DATA:  Status post bronchoscopy. EXAM: CHEST 1 VIEW COMPARISON:  CT scan of January 31, 2016. Radiographs of January 22, 2016. FINDINGS: The heart size and mediastinal contours are within normal limits. Both lungs are clear. No pneumothorax or pleural effusion is noted. The visualized skeletal structures are unremarkable. IMPRESSION: No acute cardiopulmonary abnormality seen. Electronically Signed   By: Marijo Conception, M.D.   On: 02/13/2016 15:05   Ct Chest W Contrast  Result Date: 01/31/2016 CLINICAL DATA:  Productive cough for 1 year EXAM: CT CHEST WITH CONTRAST TECHNIQUE: Multidetector CT imaging of the chest was performed during intravenous contrast administration. CONTRAST:  37mL ISOVUE-300 IOPAMIDOL (ISOVUE-300) INJECTION 61% COMPARISON:  None. FINDINGS: Cardiovascular: The thoracic aorta is well visualized and shows no evidence of aneurysmal dilatation or dissection. The left vertebral artery arises directly from the aortic arch. The pulmonary artery shows no large central pulmonary embolism. Cardiac structures are  within normal limits. No coronary calcifications are seen. Mediastinum/Nodes: The thoracic inlet demonstrates evidence of a 9 mm short axis left supraclavicular lymph node. Multiple mediastinal lymph nodes are identified. A conglomeration of nodes is noted lying between the left common carotid artery and right innominate artery which measures approximately 13 cm in short axis. Multiple smaller lymph nodes are noted scattered about the origins of the brachiocephalic vessels. A 10-11 mm short axis right peritracheal lymph node is noted as well as a 15 mm pretracheal lymph node. 13 mm AP window node and 19 mm sub carinal node is noted. Multiple small 1 cm and less hilar lymph nodes are identified bilaterally. Lungs/Pleura: The lungs are well aerated bilaterally. A single calcified pleural plaque is noted on the left posteriorly best seen on image number 25 of series 3. No sizable effusion or pneumothorax is  noted. Upper Abdomen: No acute abnormality is noted. No lymphadenopathy is seen. Musculoskeletal: Bony structures are within normal limits. Bilateral breast implants are seen. IMPRESSION: Multiple hilar and mediastinal lymph nodes without definitive lung abnormality. These are of uncertain etiology but may represent a chronic change such as sarcoid or possibly lymphoma. CT of the abdomen and pelvis is recommended to assess for any other lymphadenopathy. Bronchoscopic guided tissue sampling may also be helpful. Tiny calcified pleural plaque on the left. No other focal abnormality is seen. Electronically Signed   By: Inez Catalina M.D.   On: 01/31/2016 14:42   Ct Abdomen Pelvis W Contrast  Result Date: 02/06/2016 CLINICAL DATA:  Follow-up abnormal chest CT. EXAM: CT ABDOMEN AND PELVIS WITH CONTRAST TECHNIQUE: Multidetector CT imaging of the abdomen and pelvis was performed using the standard protocol following bolus administration of intravenous contrast. CONTRAST:  145mL ISOVUE-300 IOPAMIDOL (ISOVUE-300)  INJECTION 61% COMPARISON:  Chest CT 01/31/2016 FINDINGS: Lower chest: Lung bases are clear. No effusions. Heart is normal size. Hepatobiliary: No focal hepatic abnormality. Gallbladder unremarkable. Pancreas: No focal abnormality or ductal dilatation. Spleen: No focal abnormality.  Normal size. Adrenals/Urinary Tract: No adrenal abnormality. No focal renal abnormality. No stones or hydronephrosis. Urinary bladder is unremarkable. Stomach/Bowel: Stomach, large and small bowel grossly unremarkable. Vascular/Lymphatic: Aorta and iliac vessels are normal caliber. No retroperitoneal or inguinal adenopathy. Reproductive:  Prior hysterectomy.  No adnexal mass. Other: No free fluid or free air. Musculoskeletal: No acute bony abnormality or focal bone lesion. IMPRESSION: No evidence of adenopathy in the abdomen or pelvis. No acute findings. Electronically Signed   By: Rolm Baptise M.D.   On: 02/06/2016 10:34    ASSESSMENT & PLAN:   Lymphadenopathy, mediastinal # Predominant mediastinal adenopathy/in the context of chronic cough- up to 2 cm in size.  CT abdomen pelvis negative for lymphadenopathy. Bronchoscopy biopsy negative for malignancy/or sarcoid/fungal and viral infections.  # Discussed the option of repeating a scan in 3-6 months versus mediastinoscopy. Patient concerned about underlying malignancy/insurance; interested in proceeding with the next step in diagnosis. We will make a referral to Dr. Faith Rogue. Discussed with Dr. Mortimer Fries  # Patient follow-up with me in 1 month.    Cammie Sickle, MD 02/24/2016 11:59 AM

## 2016-02-23 NOTE — Patient Instructions (Addendum)
We have scheduled you for a PET Scan from your Head to your Thighs. This has been scheduled for 02/28/16 at [facility] at 0830. Arrive at 0800 at the Registration Desk in the [facility].  Bring a list of medications with you to your appointment.  Nothing to eat or drink after midnight prior to your CT Scan.   If you need to reschedule your Scan, you may do so by calling [phone].

## 2016-02-23 NOTE — Progress Notes (Signed)
Patient ID: Barbara Frederick, female   DOB: 1970/05/03, 45 y.o.   MRN: [id number]  Chief Complaint  Patient presents with  . New Patient (Initial Visit)    Mediastinal Lymphadenopathy    Referred By Dr. Fabienne Bruns Reason for Referral Mediastinal lymphadenopathy  HPI Location, Quality, Duration, Severity, Timing, Context, Modifying Factors, Associated Signs and Symptoms.  Barbara Frederick is a 45 y.o. female.  She was in her usual state of health until about a month ago when she began developing a slight cough. She was seen by her primary care physician where chest x-ray is obtained. Chest x-ray revealed extensive mediastinal adenopathy and a subsequent CT scan was performed. CT scan showed extensive mediastinal adenopathy and she then underwent a endobronchial ultrasound-guided biopsy of several mediastinal lymph node stations. These were all negative for malignancy. There is no evidence of granulomas as well. She does have some very small scattered nodules in the lungs but these are extremely nonspecific. She states that her cough has been about the same. She's had no fevers or chills. There is no purulent sputum. She states that she has had a travel history outside the [place] about a year ago when she went to [place] and many years ago when she went to [place]. There is no known tuberculosis exposure. She has a history of a leiomyoma of the uterus and underwent surgical procedures. The first procedure was for a supra-cervical hysterectomy followed by vaginal hysterectomy. She then developed a small nodule at her umbilicus which turned out to be a leiomyoma consistent with an uterine primary. However there is no evidence of metastatic disease anywhere within the lungs or retroperitoneum.  She has had some low-grade fevers as well. This is been going on for a long time. She has some occasional night sweats but nothing that is out of the ordinary. She states that she attributes this  to menopause. She does have one remaining ovary.   Past Medical History:  Diagnosis Date  . Allergy   . Anxiety     Past Surgical History:  Procedure Laterality Date  . ABDOMINAL HYSTERECTOMY  2010   Dr Amalia Hailey  . AUGMENTATION MAMMAPLASTY Bilateral 2001  . BREAST ENHANCEMENT SURGERY Bilateral [place] [employer] Implants  . CYST EXCISION    . ENDOBRONCHIAL ULTRASOUND N/A 02/13/2016   Procedure: ENDOBRONCHIAL ULTRASOUND;  Surgeon: Laverle Hobby, MD;  Location: [facility] ORS;  Service: Cardiopulmonary;  Laterality: N/A;  . OVARY SURGERY     one ovary [facility] cervix at [facility]    Family History  Problem Relation Age of Onset  . Cancer Paternal Grandmother 11    breast  . Hypertension Mother   . Emphysema Father   . Hypertension Brother   . Cancer Paternal Aunt 67    breast  . Colon cancer Maternal Grandfather   . Rectal cancer Maternal Grandfather   . Lung cancer Paternal Grandfather     Social History Social History  Substance Use Topics  . Smoking status: Never Smoker  . Smokeless tobacco: Never Used  . Alcohol use Yes     Comment: 3-5/week    Allergies  Allergen Reactions  . Codeine Itching    itching    Current Outpatient Prescriptions  Medication Sig Dispense Refill  . ALPRAZolam (XANAX) 0.5 MG tablet Take 1 tablet (0.5 mg total) by mouth 2 (two) times daily as needed for anxiety. Reported on 04/05/2015 60 tablet 5  . buPROPion (WELLBUTRIN XL) 150 MG 24 hr  tablet Take 1 tablet (150 mg total) by mouth daily. 30 tablet 5  . doxycycline (ORACEA) 40 MG capsule TAKE ONE CAPSULE BY MOUTH WITH food AND plenty OF IN FLUID  3  . loratadine (CLARITIN) 10 MG tablet Take 10 mg by mouth daily.     . mometasone (NASONEX) 50 MCG/ACT nasal spray Place 2 sprays into the nose daily.    . Multiple Vitamin (MULTI-VITAMINS) TABS Take 1 tablet by mouth daily.     . RHOFADE 1 % CREA APPLY TO THE AFFECTED AREA EVERY MORNING  3   No current facility-administered medications  for this visit.       Review of Systems A complete review of systems was asked and was negative except for the following positive findings - mild cough, low-grade fevers, night sweats, Blood pressure 134/87, pulse 84, temperature 98.7 F (37.1 C), temperature source Oral, resp. rate 20, height 5\' 8"  (1.727 m), weight 161 lb 12.8 oz (73.4 kg), SpO2 99 %.  Physical Exam CONSTITUTIONAL:  Pleasant, well-developed, well-nourished, and in no acute distress. EYES: Pupils equal and reactive to light, Sclera non-icteric EARS, NOSE, MOUTH AND THROAT:  The oropharynx was clear.  Dentition is good repair.  Oral mucosa pink and moist. LYMPH NODES:  Lymph nodes in the neck and axillae were normal RESPIRATORY:  Lungs were clear.  Normal respiratory effort without pathologic use of accessory muscles of respiration CARDIOVASCULAR: Heart was regular without murmurs.  There were no carotid bruits. GI: The abdomen was soft, nontender, and nondistended. There were no palpable masses. There was no hepatosplenomegaly. There were normal bowel sounds in all quadrants. GU:  Rectal deferred.   MUSCULOSKELETAL:  Normal muscle strength and tone.  No clubbing or cyanosis.   SKIN:  There were no pathologic skin lesions.  There were no nodules on palpation. NEUROLOGIC:  Sensation is normal.  Cranial nerves are grossly intact. PSYCH:  Oriented to person, place and time.  Mood and affect are normal.  Data Reviewed CT scan of the chest and abdomen  I have personally reviewed the patient's imaging, laboratory findings and medical records.    Assessment    I have independently reviewed the patient's chest CT. This shows extensive bilateral mediastinal and hilar adenopathy. The lungs themselves look reasonably clear with only some very small 2 mm scattered nodules. There is a bandlike area of atelectasis in the right lower lung zone. Again this is nonspecific.    Plan    I had a long discussion with her and her  husband today regarding the possible diagnosis and options. I think I would recommend a PET scan to see if there is any irregular mass of disease outside the mediastinum. Assuming that the PET scan shows uptake within the mediastinal nodes she can then undergo mediastinal endoscopy. I did explain to her the indications and risks of the procedure. We also discussed the option of just waiting and watching should the PET scan be negative. She will have that performed a week and I will contact her after that's done to review those with her.       Nestor Lewandowsky, MD 02/23/2016, 4:18 PM

## 2016-02-28 ENCOUNTER — Encounter
Admission: RE | Admit: 2016-02-28 | Discharge: 2016-02-28 | Disposition: A | Discharge status: Home / Self Care | Payer: 59 | Source: Ambulatory Visit | Attending: Cardiothoracic Surgery | Admitting: Cardiothoracic Surgery

## 2016-02-28 DIAGNOSIS — R590 Localized enlarged lymph nodes: Secondary | ICD-10-CM | POA: Insufficient documentation

## 2016-02-28 LAB — GLUCOSE, CAPILLARY: GLUCOSE-CAPILLARY: 111 mg/dL — AB (ref 65–99)

## 2016-02-28 MED ORDER — FLUDEOXYGLUCOSE F - 18 (FDG) INJECTION
12.5000 | Freq: Once | INTRAVENOUS | Status: AC | PRN
  Administered 2016-02-28: 12.5 via INTRAVENOUS

## 2016-02-29 ENCOUNTER — Telehealth: Payer: Self-pay

## 2016-02-29 ENCOUNTER — Ambulatory Visit (INDEPENDENT_AMBULATORY_CARE_PROVIDER_SITE_OTHER): Payer: 59 | Admitting: Cardiothoracic Surgery

## 2016-02-29 DIAGNOSIS — A150 Tuberculosis of lung: Secondary | ICD-10-CM

## 2016-02-29 NOTE — Telephone Encounter (Signed)
Ordered placed. Nothing further needed.

## 2016-02-29 NOTE — Telephone Encounter (Signed)
Dr. Mortimer Fries, what would you like this referral associated what?

## 2016-02-29 NOTE — Telephone Encounter (Signed)
AFB positive culture

## 2016-02-29 NOTE — Telephone Encounter (Signed)
Patient notified  Please place ID consult for Dr. Noralyn Pick

## 2016-02-29 NOTE — Telephone Encounter (Signed)
Received call from Manuela Schwartz with lab in regards to abnormal acid fast culture.  DK please advise. Thanks.

## 2016-03-05 LAB — CULTURE, FUNGUS WITHOUT SMEAR

## 2016-03-07 ENCOUNTER — Telehealth: Payer: Self-pay | Admitting: Internal Medicine

## 2016-03-07 DIAGNOSIS — J189 Pneumonia, unspecified organism: Secondary | ICD-10-CM

## 2016-03-07 NOTE — Telephone Encounter (Signed)
Spoke with pt, who states she spoke who [facility], who reports they do not have a referral for her, and were very rude to her. Pt would like to discuss results further with DK and would like to be referred to [facility]. Pt states [place] made her feel up cared for and forgotten about. I explained to pt that we had to cancel the first referral that was sent to [facility], due to [facility] not seeing pt for that dx. Pt would like to know if her dx has changed.   DK please advise. Thanks.

## 2016-03-07 NOTE — Telephone Encounter (Signed)
Ordered placed per DK ( verbally) Pt aware & voiced understanding. Nothing further needed.

## 2016-03-07 NOTE — Telephone Encounter (Signed)
Pt calling stating we were referring her to [facility] for infections diease  But every time she called over there they did not want to help make her appointment Stating they have not received the referral  Pt said they were very rude to her and would like [place] to send it now to [place] Please advise.

## 2016-03-07 NOTE — Telephone Encounter (Signed)
Pt call back number is [phone]

## 2016-03-08 ENCOUNTER — Other Ambulatory Visit
Admission: RE | Admit: 2016-03-08 | Discharge: 2016-03-08 | Disposition: A | Discharge status: Home / Self Care | Payer: 59 | Source: Ambulatory Visit | Attending: Internal Medicine | Admitting: Internal Medicine

## 2016-03-08 DIAGNOSIS — J189 Pneumonia, unspecified organism: Secondary | ICD-10-CM | POA: Diagnosis not present

## 2016-03-09 LAB — MISC LABCORP TEST (SEND OUT): LABCORP TEST CODE: 183560

## 2016-03-11 DIAGNOSIS — A310 Pulmonary mycobacterial infection: Secondary | ICD-10-CM | POA: Diagnosis not present

## 2016-03-11 DIAGNOSIS — R590 Localized enlarged lymph nodes: Secondary | ICD-10-CM | POA: Diagnosis not present

## 2016-03-11 LAB — CRYPTOCOCCUS ANTIGEN, SERUM: Cryptococcus Antigen, Serum: NEGATIVE

## 2016-03-12 ENCOUNTER — Telehealth: Payer: Self-pay

## 2016-03-12 LAB — QUANTIFERON IN TUBE
QFT TB AG MINUS NIL VALUE: 0.01 IU/mL
QUANTIFERON MITOGEN VALUE: 6.77 [IU]/mL
QUANTIFERON NIL VALUE: 0.04 [IU]/mL
QUANTIFERON TB AG VALUE: 0.05 [IU]/mL
QUANTIFERON TB GOLD: NEGATIVE

## 2016-03-12 LAB — QUANTIFERON TB GOLD ASSAY (BLOOD)

## 2016-03-12 NOTE — Telephone Encounter (Signed)
Received call from [place] from lab, who states there has been a correction made on [facility] with reflex from 02/13/16.  DK please advise.

## 2016-03-13 ENCOUNTER — Encounter: Payer: Self-pay | Admitting: Internal Medicine

## 2016-03-13 NOTE — Telephone Encounter (Signed)
Acid Fast Culture with reflexed sensitivities  Order: [id number]  Status:  Edited Result - FINAL  Visible to patient:  Yes (MyChart)  Next appt:  03/27/2016 at 09:00 AM in Oncology Cammie Sickle, MD)   4wk ago  Acid Fast Culture Positive    Comments: (NOTE)  Acid-fast bacilli have been detected in culture at 2 weeks; see AFB  Organism ID by DNA probe  Notified Celine Ahr 02/29/16 at 18 ([place]).  Faxed to [phone].  Performed At: [facility]  [place], [place] [id number]  Lindon Romp MD [id number]   [place]

## 2016-03-13 NOTE — Telephone Encounter (Signed)
Please obtain final results

## 2016-03-14 NOTE — Telephone Encounter (Signed)
DK please advise to below email. Thanks

## 2016-03-15 ENCOUNTER — Institutional Professional Consult (permissible substitution) (INDEPENDENT_AMBULATORY_CARE_PROVIDER_SITE_OTHER): Payer: 59 | Admitting: Cardiothoracic Surgery

## 2016-03-15 VITALS — BP 126/82 | HR 79 | Resp 20 | Ht 68.0 in | Wt 158.0 lb

## 2016-03-15 DIAGNOSIS — R590 Localized enlarged lymph nodes: Secondary | ICD-10-CM

## 2016-03-15 NOTE — Progress Notes (Signed)
[address]       [place] [address]             [phone]                    Alina Wright Burry [place] Medical Record [id number] Date of Birth: 08-04-1970  Referring: Nestor Lewandowsky, MD Primary Care: Wilhemena Durie, MD  Chief Complaint:    Chief Complaint  Patient presents with  . Adenopathy    Surgical eval, PET Scan 02/28/16, Abd/Pelvis CT 02/06/16, Chest CT 01/31/16, Spirometry 02/11/16    History of Present Illness:    Barbara Frederick 45 y.o. female is seen in the office  today for evaluation of cough and mediastinal adenopathy. The patient notes beginning of a dry persistent cough in October of this year. She denies any associated fever chills or weight loss, she has noted night sweats. In evaluating the cough a chest x-ray was obtained leading to PET scan CT scan of the chest and abdomen and spirometry. The scans confirmed evidence of mediastinal adenopathy but a nonspecific pattern.  And ebus bronchoscopy was performed, the patient did note she was told they had difficulty with intubation. Cultures done at the time of his sternoscopy revealed Mycobacterium avium complex.   She is now referred for consideration of mediastinoscopy as noted definitive diagnosis has been made yet   Anesthesia note:  DL with Mac 3 by CRNA, Grade 2 view.  Bougie placed through cords, however, unable to pass 8.5 or 8.0 ETT over bougie.  DL by MDA with same results.  Decision to proceed with #5 LMA.  Current Activity/ Functional Status:  Patient is independent with mobility/ambulation, transfers, ADL's, IADL's.   Zubrod Score: At the time of surgery this patient's most appropriate activity status/level should be described as: [x]     0    Normal activity, no symptoms- cough only []     1    Restricted in physical strenuous activity but ambulatory, able to do out light work []     2    Ambulatory and capable of self care, unable to do work activities, up and  about               >50 % of waking hours                              []     3    Only limited self care, in bed greater than 50% of waking hours []     4    Completely disabled, no self care, confined to bed or chair []     5    Moribund   Past Medical History:  Diagnosis Date  . Allergy   . Anxiety     Past Surgical History:  Procedure Laterality Date  . ABDOMINAL HYSTERECTOMY  2010   Dr Amalia Hailey  . AUGMENTATION MAMMAPLASTY Bilateral 2001  . BREAST ENHANCEMENT SURGERY Bilateral [employer]  . CYST EXCISION    . ENDOBRONCHIAL ULTRASOUND N/A 02/13/2016   Procedure: ENDOBRONCHIAL ULTRASOUND;  Surgeon: Laverle Hobby, MD;  Location: [facility] ORS;  Service: Cardiopulmonary;  Laterality: N/A;  . 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  XDB/01/25/2013  Family History  Problem  Relation Age of Onset  . Cancer Paternal Grandmother 10    breast  . Hypertension Mother   . Emphysema Father   . Hypertension Brother   . Cancer Paternal Aunt 35    breast  . Colon cancer Maternal Grandfather   . Rectal cancer Maternal Grandfather   . Lung cancer Paternal Grandfather     Social History   Social History  . Marital status: Married    Spouse name: N/A  . Number of children: N/A  . Years of education: N/A   Occupational History  . Not on file.   Social History Main Topics  . Smoking status: Never Smoker  . Smokeless tobacco: Never Used  . Alcohol use Yes     Comment: 3-5/week  . Drug use: No  . Sexual activity: Not on file   Other Topics Concern  . Not on file   Social History Narrative  . No narrative on file    History  Smoking Status  . Never Smoker  Smokeless Tobacco  . Never Used    History  Alcohol Use  . Yes    Comment: 3-5/week     Allergies  Allergen Reactions  . Codeine Itching    itching    Current  Outpatient Prescriptions  Medication Sig Dispense Refill  . ALPRAZolam (XANAX) 0.5 MG tablet Take 1 tablet (0.5 mg total) by mouth 2 (two) times daily as needed for anxiety. Reported on 04/05/2015 60 tablet 5  . buPROPion (WELLBUTRIN XL) 150 MG 24 hr tablet Take 1 tablet (150 mg total) by mouth daily. 30 tablet 5  . doxycycline (ORACEA) 40 MG capsule TAKE ONE CAPSULE BY MOUTH WITH food AND plenty OF IN FLUID  3  . loratadine (CLARITIN) 10 MG tablet Take 10 mg by mouth daily.     . mometasone (NASONEX) 50 MCG/ACT nasal spray Place 2 sprays into the nose daily.    . Multiple Vitamin (MULTI-VITAMINS) TABS Take 1 tablet by mouth daily.     . RHOFADE 1 % CREA APPLY TO THE AFFECTED AREA EVERY MORNING  3   No current facility-administered medications for this visit.       Review of Systems:     Cardiac Review of Systems: Y or N  Chest Pain [ n   ]  Resting SOB [ n  ] Exertional SOB  [ y ]  Orthopnea [ n ]   Pedal Edema [ n  ]    Palpitations [ n ] Syncope  [ n ]   Presyncope [n   ]  General Review of Systems: [Y] = yes [  ]=no Constitional: recent weight change [n  ];  Wt loss over the last 3 months [   ] anorexia [  ]; fatigue [n  ]; nausea [  ]; night sweats Blue.Reese  ]; fever [n  ]; or chills Florencio.Farrier  ];          Dental: poor dentition[ n ]; Last Dentist visit:   Eye : blurred vision [  ]; diplopia [   ]; vision changes [  ];  Amaurosis fugax[  ]; Resp: cough Blue.Reese  ];  wheezing[n  ];  hemoptysis[ n ]; shortness of breath[n  ]; paroxysmal nocturnal dyspnea[ n ]; dyspnea on exertion[ y ]; or orthopnea[ n ];  GI:  gallstones[  ], vomiting[  ];  dysphagia[  ]; melena[  ];  hematochezia [  ]; heartburn[  ];   Hx of  Colonoscopy[  ];  GU: kidney stones [  ]; hematuria[  ];   dysuria [  ];  nocturia[  ];  history of     obstruction [  ]; urinary frequency [  ]             Skin: rash, swelling[  ];, hair loss[  ];  peripheral edema[  ];  or itching[  ]; Musculosketetal: myalgias[  ];  joint swelling[  ];  joint  erythema[ n ];  joint pain[  ];  back pain[n  ];  Heme/Lymph: bruising[n  ];  bleeding[  ];  anemia[  ];  Neuro: TIA[n  ];  headaches[  ];  stroke[  ];  vertigo[  ];  seizures[  ];   paresthesias[  ];  difficulty walking[n  ];  Psych:depression[  ]; anxiety[  ];  Endocrine: diabetes[  ];  thyroid dysfunction[  ];  Immunizations: Flu up to date [ y ]; Pneumococcal up to date [ y ];  Other:  Physical Exam: BP 126/82   Pulse 79   Resp 20   Ht 5\' 8"  (1.727 m)   Wt 158 lb (71.7 kg)   SpO2 98% Comment: RA  BMI 24.02 kg/m   PHYSICAL EXAMINATION: General appearance: alert, cooperative, appears stated age and no distress Head: Normocephalic, without obvious abnormality, atraumatic Neck: no adenopathy, no carotid bruit, no JVD, supple, symmetrical, trachea midline and thyroid not enlarged, symmetric, no tenderness/mass/nodules Lymph nodes: Cervical, supraclavicular, and axillary nodes normal. Resp: clear to auscultation bilaterally Back: symmetric, no curvature. ROM normal. No CVA tenderness. Cardio: regular rate and rhythm, S1, S2 normal, no murmur, click, rub or gallop GI: soft, non-tender; bowel sounds normal; no masses,  no organomegaly Extremities: extremities normal, atraumatic, no cyanosis or edema and Homans sign is negative, no sign of DVT Neurologic: Grossly normal  Diagnostic Studies & Laboratory data:     Recent Radiology Findings:   CLINICAL DATA:  Productive cough for 1 year  EXAM: CT CHEST WITH CONTRAST  TECHNIQUE: Multidetector CT imaging of the chest was performed during intravenous contrast administration.  CONTRAST:  8mL ISOVUE-300 IOPAMIDOL (ISOVUE-300) INJECTION 61%  COMPARISON:  None.  FINDINGS: Cardiovascular: The thoracic aorta is well visualized and shows no evidence of aneurysmal dilatation or dissection. The left vertebral artery arises directly from the aortic arch. The pulmonary artery shows no large central pulmonary embolism. Cardiac  structures are within normal limits. No coronary calcifications are seen.  Mediastinum/Nodes: The thoracic inlet demonstrates evidence of a 9 mm short axis left supraclavicular lymph node. Multiple mediastinal lymph nodes are identified. A conglomeration of nodes is noted lying between the left common carotid artery and right innominate artery which measures approximately 13 cm in short axis. Multiple smaller lymph nodes are noted scattered about the origins of the brachiocephalic vessels. A 10-11 mm short axis right peritracheal lymph node is noted as well as a 15 mm pretracheal lymph node. 13 mm AP window node and 19 mm sub carinal node is noted. Multiple small 1 cm and less hilar lymph nodes are identified bilaterally.  Lungs/Pleura: The lungs are well aerated bilaterally. A single calcified pleural plaque is noted on the left posteriorly best seen on image number 25 of series 3. No sizable effusion or pneumothorax is noted.  Upper Abdomen: No acute abnormality is noted. No lymphadenopathy is seen.  Musculoskeletal: Bony structures are within normal limits. Bilateral breast implants are seen.  IMPRESSION: Multiple hilar and mediastinal lymph nodes without definitive lung abnormality. These are of  uncertain etiology but may represent a chronic change such as sarcoid or possibly lymphoma. CT of the abdomen and pelvis is recommended to assess for any other lymphadenopathy. Bronchoscopic guided tissue sampling may also be helpful.  Tiny calcified pleural plaque on the left. No other focal abnormality is seen.   Electronically Signed   By: Inez Catalina M.D.   On: 01/31/2016 14:42  CLINICAL DATA:  Follow-up abnormal chest CT.  EXAM: CT ABDOMEN AND PELVIS WITH CONTRAST  TECHNIQUE: Multidetector CT imaging of the abdomen and pelvis was performed using the standard protocol following bolus administration of intravenous contrast.  CONTRAST:  171mL ISOVUE-300  IOPAMIDOL (ISOVUE-300) INJECTION 61%  COMPARISON:  Chest CT 01/31/2016  FINDINGS: Lower chest: Lung bases are clear. No effusions. Heart is normal size.  Hepatobiliary: No focal hepatic abnormality. Gallbladder unremarkable.  Pancreas: No focal abnormality or ductal dilatation.  Spleen: No focal abnormality.  Normal size.  Adrenals/Urinary Tract: No adrenal abnormality. No focal renal abnormality. No stones or hydronephrosis. Urinary bladder is unremarkable.  Stomach/Bowel: Stomach, large and small bowel grossly unremarkable.  Vascular/Lymphatic: Aorta and iliac vessels are normal caliber. No retroperitoneal or inguinal adenopathy.  Reproductive:  Prior hysterectomy.  No adnexal mass.  Other: No free fluid or free air.  Musculoskeletal: No acute bony abnormality or focal bone lesion.  IMPRESSION: No evidence of adenopathy in the abdomen or pelvis.  No acute findings.   Electronically Signed   By: Rolm Baptise M.D.   On: 02/06/2016 10:34    Nm Pet Image Initial (pi) Skull Base To Thigh  Result Date: 02/28/2016 CLINICAL DATA:  Initial Treatment strategy for mediastinal adenopathy was negative for malignancy at cytology. EXAM: NUCLEAR MEDICINE PET SKULL BASE TO THIGH TECHNIQUE: 12.5 mCi F-18 FDG was injected intravenously. Full-ring PET imaging was performed from the skull base to thigh after the radiotracer. CT data was obtained and used for attenuation correction and anatomic localization. FASTING BLOOD GLUCOSE:  Value: 111 mg/dl COMPARISON:  01/31/2016 chest CT FINDINGS: NECK No hypermetabolic lymph nodes in the neck. CHEST Mediastinal and bilateral hilar adenopathy is again noted, a right paratracheal lymph node measures 1.5 cm in short axis on image 77/3 and previously measured 1.6 cm; this node has a maximum standard uptake value of 8.0. The other prevascular, AP window, and subcarinal lymph nodes are likewise hypermetabolic with the subcarinal node  having maximum SUV 9.6. Faint bilateral hilar activity although less striking than the mediastinal activity. Background mediastinal blood pool SUV is 2.5. A small left supraclavicular node on image 54 series 3 measures 6 mm in short axis but is only very faintly hypermetabolic, maximum SUV 3.0. No axillary adenopathy. No compelling peribronchovascular or pleural-based nodularity in the lungs ; there is some mild scarring or atelectasis in the right middle lobe and lingula. Bilateral breast implants. ABDOMEN/PELVIS No abnormal hypermetabolic activity within the liver, pancreas, adrenal glands, or spleen. No hypermetabolic lymph nodes in the abdomen or pelvis. SKELETON No focal hypermetabolic activity to suggest skeletal metastasis. IMPRESSION: 1. Hypermetabolic mediastinal adenopathy. Appearance suggests either malignancy or granulomatous process. On recent biopsies sampling of several lymph nodes was negative for malignancy. Granulomatous processes such as sarcoidosis or infection with certain agents such as histoplasmosis or tuberculosis could cause adenopathy without findings of malignancy. No specific intrapulmonary findings of sarcoidosis (no peribronchovascular nodularity or significant degree of subpleural nodularity). Electronically Signed   By: Van Clines M.D.   On: 02/28/2016 12:06     I have independently reviewed the above radiology  studies  and reviewed the findings with the patient.   Recent Lab Findings: Lab Results  Component Value Date   WBC 6.0 02/01/2016   HGB 13.4 05/17/2014   HCT 40.0 02/01/2016   PLT 253 02/01/2016   GLUCOSE 107 (H) 02/01/2016   CHOL 233 (H) 02/01/2016   TRIG 185 (H) 02/01/2016   HDL 58 02/01/2016   LDLCALC 138 (H) 02/01/2016   ALT 20 02/01/2016   AST 26 02/01/2016   NA 140 02/01/2016   K 4.3 02/01/2016   CL 99 02/01/2016   CREATININE 0.90 02/01/2016   BUN 13 02/01/2016   CO2 24 02/01/2016   TSH 3.770 02/01/2016   HISTOPLASMA GAL'MANNAN AG UR  - negative  Quantiferon gold - neg Cryptococcus antigen- neg  M tuberculosis complex Negative   M avium complex PositiveVC    Comments: CORRECTED ON 12/13 AT 1932: PREVIOUSLY REPORTED AS Negative  M kansasii Not Indicated   M gordonae Not Indicated   Other: NAIDN   Comments: No additional identification testing is necessary.  Susceptibility Testing See reflex.VC   Comments: (NOTE)  Performed At: [facility]  [address] [place], [place] [id number]  Lindon Romp MD [id number]      Assessment / Plan:    1/Hypermetabolic mediastinal adenopathy-  suggests either malignancy or granulomatous process. Recent EBUS  biopsies were  negative for malignancy. Granulomatous processes such as sarcoidosis or infection with certain agents such as histoplasmosis or tuberculosis could cause adenopathy  2/ /M avium  complex PositiveVC       I discussed the findings of diffuse mediastinal adenopathy with the patient and her husband in without any definitive diagnosis as of yet agree with recommendation by Dr. Faith Rogue to proceed with mediastinoscopy, the risks and options of surgery are discussed with the patient in detail. I do not think it would be advantageous to repeat ebus as we would likely end up with the same result. Further tissue both for cultures and histology for tissue architecture is needed to make a definitive diagnosis. The patient is willing to proceed and we tentatively will arrange mediastinoscopy on January 5.  I  spent 40 minutes counseling the patient face to face and 50% or more the  time was spent in counseling and coordination of care. The total time spent in the appointment was 60 minutes.  Grace Isaac MD      [address] [place] [address] Office [phone]   Beeper [phone]  03/15/2016 4:49 PM

## 2016-03-19 ENCOUNTER — Other Ambulatory Visit: Payer: Self-pay | Admitting: *Deleted

## 2016-03-19 DIAGNOSIS — R590 Localized enlarged lymph nodes: Secondary | ICD-10-CM

## 2016-03-20 LAB — AFB ORGANISM ID BY DNA PROBE
M TUBERCULOSIS COMPLEX: NEGATIVE
M avium complex: POSITIVE — AB

## 2016-03-20 LAB — MAC SUSCEPTIBILITY BROTH
Amikacin: 8
Ciprofloxacin: >16
Moxifloxacin: 4

## 2016-03-20 LAB — ACID FAST CULTURE WITH REFLEXED SENSITIVITIES: ACID FAST CULTURE - AFSCU3: POSITIVE — AB

## 2016-03-22 NOTE — Pre-Procedure Instructions (Addendum)
Barbara Frederick  03/22/2016      [address], [place] [address] Phone: [phone] Fax: [phone]    Your procedure is scheduled on Friday January 5.  Report to [facility] Admitting at 5:30 A.M.  Call this number if you have problems the morning of surgery:  [phone]   Remember:  Do not eat food or drink liquids after midnight.  Take these medicines the morning of surgery with A SIP OF WATER: bupropion (wellbutrin), loratadine (claritin) if needed & XANAX if needed is OK  7 days prior to surgery STOP taking any Aspirin, Aleve, Naproxen, Ibuprofen, Motrin, Advil, Goody's, BC's, all herbal medications, fish oil, and all vitamins    Do not wear jewelry, make-up or nail polish.   Do not wear lotions, powders, or perfumes, or deoderant.   Do not shave 48 hours prior to surgery.    Do not bring valuables to the hospital.   [facility] is not responsible for any belongings or valuables.  Contacts, dentures or bridgework may not be worn into surgery.  Leave your suitcase in the car.  After surgery it may be brought to your room.  For patients admitted to the hospital, discharge time will be determined by your treatment team.  Patients discharged the day of surgery will not be allowed to drive home.    Special instructions:    [facility]- Preparing For Surgery  Before surgery, you can play an important role. Because skin is not sterile, your skin needs to be as free of germs as possible. You can reduce the number of germs on your skin by washing with CHG (chlorahexidine gluconate) Soap before surgery.  CHG is an antiseptic cleaner which kills germs and bonds with the skin to continue killing germs even after washing.  Please do not use if you have an allergy to CHG or antibacterial soaps. If your skin becomes reddened/irritated stop using the CHG.  Do not shave  (including legs and underarms) for at least 48 hours prior to first CHG shower. It is OK to shave your face.  Please follow these instructions carefully.   1. Shower the NIGHT BEFORE SURGERY and the MORNING OF SURGERY with CHG.   2. If you chose to wash your hair, wash your hair first as usual with your normal shampoo.  3. After you shampoo, rinse your hair and body thoroughly to remove the shampoo.  4. Use CHG as you would any other liquid soap. You can apply CHG directly to the skin and wash gently with a scrungie or a clean washcloth.   5. Apply the CHG Soap to your body ONLY FROM THE NECK DOWN.  Do not use on open wounds or open sores. Avoid contact with your eyes, ears, mouth and genitals (private parts). Wash genitals (private parts) with your normal soap.  6. Wash thoroughly, paying special attention to the area where your surgery will be performed.  7. Thoroughly rinse your body with warm water from the neck down.  8. DO NOT shower/wash with your normal soap after using and rinsing off the CHG Soap.  9. Pat yourself dry with a CLEAN TOWEL.   10. Wear CLEAN PAJAMAS   11. Place CLEAN SHEETS on your bed the night of your first shower and DO NOT SLEEP WITH PETS.    Day of Surgery: Do not apply any deodorants/lotions. Please wear clean  clothes to the hospital/surgery center.      Please read over the following fact sheets that you were given. Coughing and Deep Breathing and MRSA Information

## 2016-03-26 ENCOUNTER — Encounter (HOSPITAL_COMMUNITY)
Admission: RE | Admit: 2016-03-26 | Discharge: 2016-03-26 | Disposition: A | Discharge status: Home / Self Care | Payer: 59 | Source: Ambulatory Visit | Attending: Cardiothoracic Surgery | Admitting: Cardiothoracic Surgery

## 2016-03-26 ENCOUNTER — Ambulatory Visit (HOSPITAL_COMMUNITY)
Admission: RE | Admit: 2016-03-26 | Discharge: 2016-03-26 | Disposition: A | Discharge status: Home / Self Care | Payer: 59 | Source: Ambulatory Visit | Attending: Cardiothoracic Surgery | Admitting: Cardiothoracic Surgery

## 2016-03-26 ENCOUNTER — Encounter (HOSPITAL_COMMUNITY): Payer: Self-pay

## 2016-03-26 DIAGNOSIS — Z01818 Encounter for other preprocedural examination: Secondary | ICD-10-CM | POA: Diagnosis not present

## 2016-03-26 DIAGNOSIS — R590 Localized enlarged lymph nodes: Secondary | ICD-10-CM | POA: Diagnosis not present

## 2016-03-26 DIAGNOSIS — R938 Abnormal findings on diagnostic imaging of other specified body structures: Secondary | ICD-10-CM | POA: Diagnosis not present

## 2016-03-26 DIAGNOSIS — R05 Cough: Secondary | ICD-10-CM | POA: Diagnosis not present

## 2016-03-26 HISTORY — DX: Other complications of anesthesia, initial encounter: T88.59XA

## 2016-03-26 HISTORY — DX: Unspecified osteoarthritis, unspecified site: M19.90

## 2016-03-26 HISTORY — DX: Adverse effect of unspecified anesthetic, initial encounter: T41.45XA

## 2016-03-26 LAB — CBC
HCT: 41.2 % (ref 36.0–46.0)
Hemoglobin: 14.3 g/dL (ref 12.0–15.0)
MCH: 31.1 pg (ref 26.0–34.0)
MCHC: 34.7 g/dL (ref 30.0–36.0)
MCV: 89.6 fL (ref 78.0–100.0)
Platelets: 218 10*3/uL (ref 150–400)
RBC: 4.6 MIL/uL (ref 3.87–5.11)
RDW: 12.6 % (ref 11.5–15.5)
WBC: 7.3 10*3/uL (ref 4.0–10.5)

## 2016-03-26 LAB — ABO/RH: ABO/RH(D): O POS

## 2016-03-26 LAB — COMPREHENSIVE METABOLIC PANEL
ALT: 27 U/L (ref 14–54)
AST: 34 U/L (ref 15–41)
Albumin: 4.6 g/dL (ref 3.5–5.0)
Alkaline Phosphatase: 66 U/L (ref 38–126)
Anion gap: 8 (ref 5–15)
BUN: 10 mg/dL (ref 6–20)
CO2: 29 mmol/L (ref 22–32)
Calcium: 9.9 mg/dL (ref 8.9–10.3)
Chloride: 103 mmol/L (ref 101–111)
Creatinine, Ser: 0.88 mg/dL (ref 0.44–1.00)
GFR calc Af Amer: >60 mL/min (ref >60)
GFR calc non Af Amer: >60 mL/min (ref >60)
Glucose, Bld: 107 mg/dL — ABNORMAL HIGH (ref 65–99)
Potassium: 3.5 mmol/L (ref 3.5–5.1)
Sodium: 140 mmol/L (ref 135–145)
Total Bilirubin: 0.6 mg/dL (ref 0.3–1.2)
Total Protein: 7.4 g/dL (ref 6.5–8.1)

## 2016-03-26 LAB — TYPE AND SCREEN
ABO/RH(D): O POS
Antibody Screen: NEGATIVE

## 2016-03-26 LAB — APTT: aPTT: 30 seconds (ref 24–36)

## 2016-03-26 LAB — PROTIME-INR
INR: 0.93
Prothrombin Time: 12.5 seconds (ref 11.4–15.2)

## 2016-03-26 LAB — SURGICAL PCR SCREEN
MRSA, PCR: NEGATIVE
Staphylococcus aureus: NEGATIVE

## 2016-03-26 NOTE — Progress Notes (Signed)
Of note: pt. Reports that she was told that it took 20 mins. To insert the correct size ETT with a recent surgery- at [facility]. She states that she thinks a 7.0 is the correct size. She reported that her throat was very sore for several days  after  Surgery.  Pt. Reports that she is without cardiac concerns. She denies ever having any advanced cardiac testing.   She is followed by Dr. Rosanna Randy for PCP in [place]. Call made to A. Zelenak, PA-C

## 2016-03-27 ENCOUNTER — Encounter (HOSPITAL_COMMUNITY): Payer: Self-pay

## 2016-03-27 ENCOUNTER — Ambulatory Visit: Payer: 59 | Admitting: Internal Medicine

## 2016-03-27 DIAGNOSIS — L820 Inflamed seborrheic keratosis: Secondary | ICD-10-CM | POA: Diagnosis not present

## 2016-03-27 DIAGNOSIS — R234 Changes in skin texture: Secondary | ICD-10-CM | POA: Diagnosis not present

## 2016-03-27 NOTE — Progress Notes (Signed)
Anesthesia Chart Review: Patient is a 46 year old female scheduled for mediastinoscopy on 03/29/2016 by Dr. Servando Snare. DX: Mediastinal lymphadenopathy.  History includes non-smoker, anxiety, arthritis, breast augmentation '01, hysterectomy '09. On 02/13/16, she underwent EBUS guided biopsies at [facility] by Dr. Ashby Dawes. The procedure was done using 5.0 LMA as her anesthesia team was unable to pass a 8.0 or 8.5 ETT over bougie (see below for anesthesia details). Biopsies were negative for malignancy. Culture then showed Mycobacterium avium complex. Mediastinoscopy recommended in hopes to obtain definitive diagnosis.   According to 02/13/16 anesthesia records, patient had IV induction with mask ventilation without difficulty.  DL with Mac 3 by CRNA, Grade 2 view.  Bougie placed through cords, however, unable to pass 8.5 or 8.0 ETT over bougie.  DL by MDA with same results.  Decision to proceed with #5 LMA. Previous anesthesia records received today from [facility]. On 01/22/13, she underwent open umbilical hernia repair with GA/LMA. On 08/24/07, she underwent laparoscopic hysterectomy with GETA using 7.0 ETT--placed after one attempt using possible Miller blade (handwriting is difficult to read; anesthesia records will be on patient's hard chart for anesthesiologist to review as needed).   Meds include Xanax, Wellbutrin XL, doxycycline, Claritin, Nasonex.  PCP is Dr. Miguel Aschoff. She was seen by [facility] Dr. Rogue Bussing on 02/02/16.  BP 135/89   Pulse 74   Temp 36.4 C   Resp 20   Ht 5' 7.5" (1.715 m)   Wt 164 lb 8 oz (74.6 kg)   SpO2 99%   BMI 25.38 kg/m   EKG 03/26/16: NSR.  CXR 03/26/16: IMPRESSION: 1. No acute cardiopulmonary disease.  Clear lungs. 2. Mild hilar prominence, which appears mildly vascular based on the recent CT.  PET Scan 02/28/16: IMPRESSION: 1. Hypermetabolic mediastinal adenopathy. Appearance suggests either malignancy or granulomatous process. On recent biopsies sampling  of several lymph nodes was negative for malignancy. Granulomatous processes such as sarcoidosis or infection with certain agents such as histoplasmosis or tuberculosis could cause adenopathy without findings of malignancy. No specific intrapulmonary findings of sarcoidosis (no peribronchovascular nodularity or significant degree of subpleural nodularity).  Preoperative labs noted. Cr 0.88. Glucose 107. CBC, PT/PTT WNL. T&S done.   If no acute changes then I anticipate that she can proceed as planned.  [facility] Short Stay Center/Anesthesiology Phone [phone] 03/27/2016 1:11 PM

## 2016-03-29 ENCOUNTER — Ambulatory Visit (HOSPITAL_COMMUNITY)
Admission: RE | Admit: 2016-03-29 | Discharge: 2016-03-29 | Disposition: A | Discharge status: Home / Self Care | Payer: 59 | Source: Ambulatory Visit | Attending: Cardiothoracic Surgery | Admitting: Cardiothoracic Surgery

## 2016-03-29 ENCOUNTER — Encounter (HOSPITAL_COMMUNITY)
Admission: RE | Disposition: A | Discharge status: Home / Self Care | Payer: Self-pay | Source: Ambulatory Visit | Attending: Cardiothoracic Surgery

## 2016-03-29 ENCOUNTER — Ambulatory Visit (HOSPITAL_COMMUNITY): Payer: 59 | Admitting: Vascular Surgery

## 2016-03-29 ENCOUNTER — Ambulatory Visit (HOSPITAL_COMMUNITY): Payer: 59 | Admitting: Certified Registered"

## 2016-03-29 DIAGNOSIS — M1712 Unilateral primary osteoarthritis, left knee: Secondary | ICD-10-CM | POA: Diagnosis not present

## 2016-03-29 DIAGNOSIS — F419 Anxiety disorder, unspecified: Secondary | ICD-10-CM | POA: Insufficient documentation

## 2016-03-29 DIAGNOSIS — R590 Localized enlarged lymph nodes: Secondary | ICD-10-CM

## 2016-03-29 DIAGNOSIS — R05 Cough: Secondary | ICD-10-CM | POA: Diagnosis not present

## 2016-03-29 DIAGNOSIS — Z885 Allergy status to narcotic agent status: Secondary | ICD-10-CM | POA: Diagnosis not present

## 2016-03-29 DIAGNOSIS — K219 Gastro-esophageal reflux disease without esophagitis: Secondary | ICD-10-CM | POA: Insufficient documentation

## 2016-03-29 DIAGNOSIS — Z90710 Acquired absence of both cervix and uterus: Secondary | ICD-10-CM | POA: Diagnosis not present

## 2016-03-29 DIAGNOSIS — M1990 Unspecified osteoarthritis, unspecified site: Secondary | ICD-10-CM | POA: Diagnosis not present

## 2016-03-29 DIAGNOSIS — I888 Other nonspecific lymphadenitis: Secondary | ICD-10-CM | POA: Insufficient documentation

## 2016-03-29 HISTORY — PX: MEDIASTINOSCOPY: SHX5086

## 2016-03-29 HISTORY — PX: VIDEO BRONCHOSCOPY: SHX5072

## 2016-03-29 SURGERY — MEDIASTINOSCOPY
Anesthesia: General

## 2016-03-29 MED ORDER — EPHEDRINE SULFATE 50 MG/ML IJ SOLN
INTRAMUSCULAR | Status: DC | PRN
  Administered 2016-03-29: 5 mg via INTRAVENOUS
  Administered 2016-03-29: 10 mg via INTRAVENOUS
  Administered 2016-03-29 (×2): 5 mg via INTRAVENOUS

## 2016-03-29 MED ORDER — LACTATED RINGERS IV SOLN
INTRAVENOUS | Status: DC | PRN
Start: 2016-03-29 — End: 2016-03-29
  Administered 2016-03-29: 08:00:00 via INTRAVENOUS

## 2016-03-29 MED ORDER — FENTANYL CITRATE (PF) 100 MCG/2ML IJ SOLN: 25.0000 ug | INTRAMUSCULAR | Status: DC | PRN

## 2016-03-29 MED ORDER — ONDANSETRON HCL 4 MG/2ML IJ SOLN
INTRAMUSCULAR | Status: DC | PRN
  Administered 2016-03-29: 4 mg via INTRAVENOUS

## 2016-03-29 MED ORDER — SUCCINYLCHOLINE 20MG/ML (10ML) SYRINGE FOR MEDFUSION PUMP - OPTIME
INTRAMUSCULAR | Status: DC | PRN
  Administered 2016-03-29: 110 mg via INTRAVENOUS

## 2016-03-29 MED ORDER — LIDOCAINE HCL (PF) 1 % IJ SOLN
INTRAMUSCULAR | Status: AC
  Filled 2016-03-29: qty 30

## 2016-03-29 MED ORDER — LIDOCAINE HCL (CARDIAC) 20 MG/ML IV SOLN
INTRAVENOUS | Status: DC | PRN
  Administered 2016-03-29: 100 mg via INTRAVENOUS

## 2016-03-29 MED ORDER — DEXAMETHASONE SODIUM PHOSPHATE 10 MG/ML IJ SOLN
INTRAMUSCULAR | Status: DC | PRN
  Administered 2016-03-29: 10 mg via INTRAVENOUS

## 2016-03-29 MED ORDER — PROPOFOL 10 MG/ML IV BOLUS
INTRAVENOUS | Status: AC
  Filled 2016-03-29: qty 20

## 2016-03-29 MED ORDER — FENTANYL CITRATE (PF) 100 MCG/2ML IJ SOLN
25.0000 ug | INTRAMUSCULAR | Status: DC | PRN
  Administered 2016-03-29 (×4): 25 ug via INTRAVENOUS

## 2016-03-29 MED ORDER — HEMOSTATIC AGENTS (NO CHARGE) OPTIME
TOPICAL | Status: DC | PRN
  Administered 2016-03-29: 1 via TOPICAL

## 2016-03-29 MED ORDER — DEXTROSE 5 % IV SOLN
INTRAVENOUS | Status: AC
  Filled 2016-03-29: qty 1.5

## 2016-03-29 MED ORDER — MIDAZOLAM HCL 5 MG/5ML IJ SOLN
INTRAMUSCULAR | Status: DC | PRN
  Administered 2016-03-29 (×2): 2 mg via INTRAVENOUS

## 2016-03-29 MED ORDER — MIDAZOLAM HCL 2 MG/2ML IJ SOLN
INTRAMUSCULAR | Status: AC
  Filled 2016-03-29: qty 2

## 2016-03-29 MED ORDER — FENTANYL CITRATE (PF) 100 MCG/2ML IJ SOLN
INTRAMUSCULAR | Status: AC
  Filled 2016-03-29: qty 4

## 2016-03-29 MED ORDER — ROCURONIUM BROMIDE 100 MG/10ML IV SOLN
INTRAVENOUS | Status: DC | PRN
  Administered 2016-03-29: 20 mg via INTRAVENOUS
  Administered 2016-03-29 (×2): 10 mg via INTRAVENOUS
  Administered 2016-03-29: 30 mg via INTRAVENOUS

## 2016-03-29 MED ORDER — GLYCOPYRROLATE 0.2 MG/ML IJ SOLN
INTRAMUSCULAR | Status: DC | PRN
  Administered 2016-03-29: 0.2 mg via INTRAVENOUS

## 2016-03-29 MED ORDER — 0.9 % SODIUM CHLORIDE (POUR BTL) OPTIME
TOPICAL | Status: DC | PRN
  Administered 2016-03-29: 1000 mL

## 2016-03-29 MED ORDER — FENTANYL CITRATE (PF) 100 MCG/2ML IJ SOLN
INTRAMUSCULAR | Status: DC | PRN
  Administered 2016-03-29: 50 ug via INTRAVENOUS
  Administered 2016-03-29: 125 ug via INTRAVENOUS
  Administered 2016-03-29: 25 ug via INTRAVENOUS

## 2016-03-29 MED ORDER — PHENYLEPHRINE HCL 10 MG/ML IJ SOLN
INTRAMUSCULAR | Status: DC | PRN
  Administered 2016-03-29: 40 ug via INTRAVENOUS
  Administered 2016-03-29 (×3): 80 ug via INTRAVENOUS

## 2016-03-29 MED ORDER — SUGAMMADEX SODIUM 500 MG/5ML IV SOLN
INTRAVENOUS | Status: DC | PRN
  Administered 2016-03-29: 300 mg via INTRAVENOUS

## 2016-03-29 MED ORDER — TRAMADOL-ACETAMINOPHEN 37.5-325 MG PO TABS: 1.0000 | ORAL_TABLET | Freq: Four times a day (QID) | ORAL | 0 refills | Status: DC | PRN

## 2016-03-29 MED ORDER — LACTATED RINGERS IV SOLN
INTRAVENOUS | Status: DC | PRN
  Administered 2016-03-29 (×2): via INTRAVENOUS

## 2016-03-29 MED ORDER — FENTANYL CITRATE (PF) 100 MCG/2ML IJ SOLN
INTRAMUSCULAR | Status: AC
  Filled 2016-03-29: qty 2

## 2016-03-29 MED ORDER — PROPOFOL 10 MG/ML IV BOLUS
INTRAVENOUS | Status: DC | PRN
  Administered 2016-03-29: 180 mg via INTRAVENOUS
  Administered 2016-03-29: 20 mg via INTRAVENOUS

## 2016-03-29 MED ORDER — DEXTROSE 5 % IV SOLN
1.5000 g | INTRAVENOUS | Status: AC
  Administered 2016-03-29: 1.5 g via INTRAVENOUS

## 2016-03-29 SURGICAL SUPPLY — 40 items
BLADE SURG 10 STRL SS (BLADE) ×2 IMPLANT
CANISTER SUCTION 2500CC (MISCELLANEOUS) ×2 IMPLANT
CLIP TI MEDIUM 6 (CLIP) IMPLANT
CONT SPEC 4OZ CLIKSEAL STRL BL (MISCELLANEOUS) ×10 IMPLANT
COVER SURGICAL LIGHT HANDLE (MISCELLANEOUS) ×4 IMPLANT
DERMABOND ADVANCED (GAUZE/BANDAGES/DRESSINGS) ×1
DERMABOND ADVANCED .7 DNX12 (GAUZE/BANDAGES/DRESSINGS) ×1 IMPLANT
DRAPE LAPAROTOMY T 102X78X121 (DRAPES) ×2 IMPLANT
DRSG AQUACEL AG ADV 3.5X14 (GAUZE/BANDAGES/DRESSINGS) ×2 IMPLANT
ELECT BLADE 4.0 EZ CLEAN MEGAD (MISCELLANEOUS) ×2
ELECT CAUTERY BLADE 6.4 (BLADE) ×2 IMPLANT
ELECT REM PT RETURN 9FT ADLT (ELECTROSURGICAL) ×2
ELECTRODE BLDE 4.0 EZ CLN MEGD (MISCELLANEOUS) ×1 IMPLANT
ELECTRODE REM PT RTRN 9FT ADLT (ELECTROSURGICAL) ×1 IMPLANT
GAUZE SPONGE 4X4 12PLY STRL (GAUZE/BANDAGES/DRESSINGS) ×2 IMPLANT
GAUZE SPONGE 4X4 16PLY XRAY LF (GAUZE/BANDAGES/DRESSINGS) ×2 IMPLANT
GLOVE BIO SURGEON STRL SZ 6.5 (GLOVE) ×2 IMPLANT
GOWN STRL REUS W/ TWL LRG LVL3 (GOWN DISPOSABLE) ×1 IMPLANT
GOWN STRL REUS W/TWL LRG LVL3 (GOWN DISPOSABLE) ×1
HEMOSTAT SURGICEL 2X14 (HEMOSTASIS) ×2 IMPLANT
KIT BASIN OR (CUSTOM PROCEDURE TRAY) ×2 IMPLANT
KIT ROOM TURNOVER OR (KITS) ×2 IMPLANT
NS IRRIG 1000ML POUR BTL (IV SOLUTION) ×2 IMPLANT
PACK SURGICAL SETUP 50X90 (CUSTOM PROCEDURE TRAY) ×2 IMPLANT
PAD ARMBOARD 7.5X6 YLW CONV (MISCELLANEOUS) ×4 IMPLANT
PENCIL BUTTON HOLSTER BLD 10FT (ELECTRODE) ×2 IMPLANT
SPONGE INTESTINAL PEANUT (DISPOSABLE) IMPLANT
STAPLER VISISTAT 35W (STAPLE) IMPLANT
SUT SILK 3 0 (SUTURE) ×1
SUT SILK 3-0 18XBRD TIE 12 (SUTURE) ×1 IMPLANT
SUT VIC AB 3-0 SH 18 (SUTURE) ×2 IMPLANT
SUT VICRYL 4-0 PS2 18IN ABS (SUTURE) ×4 IMPLANT
SWAB COLLECTION DEVICE MRSA (MISCELLANEOUS) IMPLANT
SYRINGE 10CC LL (SYRINGE) ×2 IMPLANT
TOWEL OR 17X24 6PK STRL BLUE (TOWEL DISPOSABLE) ×2 IMPLANT
TOWEL OR 17X26 10 PK STRL BLUE (TOWEL DISPOSABLE) ×2 IMPLANT
TRAP SPECIMEN MUCOUS 40CC (MISCELLANEOUS) ×2 IMPLANT
TUBE ANAEROBIC SPECIMEN COL (MISCELLANEOUS) IMPLANT
TUBE CONNECTING 12X1/4 (SUCTIONS) ×2 IMPLANT
WATER STERILE IRR 1000ML POUR (IV SOLUTION) ×2 IMPLANT

## 2016-03-29 NOTE — Anesthesia Postprocedure Evaluation (Signed)
Anesthesia Post Note  Patient: Barbara Frederick  Procedure(s) Performed: Procedure(s) (LRB): Video MEDIASTINOSCOPY WITH LYMPH NODE BIOPSY (N/A) VIDEO BRONCHOSCOPY (N/A)  Patient location during evaluation: PACU Anesthesia Type: General Level of consciousness: awake Pain management: pain level controlled Respiratory status: spontaneous breathing Cardiovascular status: stable Anesthetic complications: no       Last Vitals:  Vitals:   03/29/16 1027 03/29/16 1030  BP: 131/90   Pulse: 74 68  Resp: 15 16  Temp:      Last Pain:  Vitals:   03/29/16 1000  TempSrc:   PainSc: 4                  Saman Giddens

## 2016-03-29 NOTE — Anesthesia Procedure Notes (Signed)
Procedure Name: Intubation Date/Time: 03/29/2016 7:38 AM Performed by: Gaylene Brooks Pre-anesthesia Checklist: Patient identified, Emergency Drugs available, Suction available, Patient being monitored and Timeout performed Patient Re-evaluated:Patient Re-evaluated prior to inductionOxygen Delivery Method: Circle system utilized Preoxygenation: Pre-oxygenation with 100% oxygen Intubation Type: IV induction Ventilation: Mask ventilation without difficulty Laryngoscope Size: Mac and 3 Grade View: Grade II Tube type: Oral Tube size: 7.0 mm Number of attempts: 1 Airway Equipment and Method: Stylet Placement Confirmation: ETT inserted through vocal cords under direct vision,  positive ETCO2,  CO2 detector and breath sounds checked- equal and bilateral Secured at: 22 cm Tube secured with: Tape Dental Injury: Teeth and Oropharynx as per pre-operative assessment

## 2016-03-29 NOTE — H&P (Signed)
[address]       [place] [address]             [phone]                    Andersen W Rhyner Calvert Medical Record #[id number] Date of Birth: 1970/12/06  Referring: Dr Genevive Bi Primary Care: Wilhemena Durie, MD  Chief Complaint:    Mediastinal adenopathy   History of Present Illness:    Barbara Frederick 46 y.o. female is seen in the office for evaluation of cough and mediastinal adenopathy. The patient notes beginning of a dry persistent cough in October of this year. She denies any associated fever chills or weight loss, she has noted night sweats. In evaluating the cough a chest x-ray was obtained leading to PET scan CT scan of the chest and abdomen and spirometry. The scans confirmed evidence of mediastinal adenopathy but a nonspecific pattern.  And ebus bronchoscopy was performed, the patient did note she was told they had difficulty with intubation. Cultures done at the time of bronchoscopy  revealed Mycobacterium avium complex.   She was referred for consideration of mediastinoscopy as noted definitive diagnosis has been made yet   Anesthesia note:  DL with Mac 3 by CRNA, Grade 2 view.  Bougie placed through cords, however, unable to pass 8.5 or 8.0 ETT over bougie.  DL by MDA with same results.  Decision to proceed with #5 LMA.  Current Activity/ Functional Status:  Patient is independent with mobility/ambulation, transfers, ADL's, IADL's.   Zubrod Score: At the time of surgery this patient's most appropriate activity status/level should be described as: _0     0    Normal activity, no symptoms- cough only _1     1    Restricted in physical strenuous activity but ambulatory, able to do out light work _2     2    Ambulatory and capable of self care, unable to do work activities, up and about               >50 % of waking hours                              _3     3    Only limited self care, in bed greater than 50% of waking hours _4     4     Completely disabled, no self care, confined to bed or chair _5     5    Moribund   Past Medical History:  Diagnosis Date  . Allergy   . Anxiety   . Arthritis    L knee- OA  . Complication of anesthesia    needed smaller ETT (anes unable to pass 8.0 or 8.5 ETT 02/13/16; 7.0 ETT used 08/24/07)    Past Surgical History:  Procedure Laterality Date  . ABDOMINAL HYSTERECTOMY  2010   Dr Amalia Hailey  . AUGMENTATION MAMMAPLASTY Bilateral 2001  . BREAST ENHANCEMENT SURGERY Bilateral [place] [employer]  . CYST EXCISION    . ENDOBRONCHIAL ULTRASOUND N/A 02/13/2016   Procedure: ENDOBRONCHIAL ULTRASOUND;  Surgeon: Laverle Hobby, MD;  Location: [facility] ORS;  Service: Cardiopulmonary;  Laterality: N/A;  . OVARY SURGERY     one ovary and cervix at [facility]  . 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  XDB/01/25/2013  Family History  Problem Relation Age of Onset  . Cancer Paternal Grandmother 31    breast  . Hypertension Mother   . Emphysema Father   . Hypertension Brother   . Cancer Paternal Aunt 53    breast  . Colon cancer Maternal Grandfather   . Rectal cancer Maternal Grandfather   . Lung cancer Paternal Grandfather     Social History   Social History  . Marital status: Married    Spouse name: N/A  . Number of children: N/A  . Years of education: N/A   Occupational History  . Not on file.   Social History Main Topics  . Smoking status: Never Smoker  . Smokeless tobacco: Never Used  . Alcohol use Yes     Comment: 3-5/week, wine on the weekend   . Drug use: No  . Sexual activity: Not on file   Other Topics Concern  . Not on file   Social History Narrative  . No narrative on file    History  Smoking Status  . Never Smoker  Smokeless Tobacco  . Never Used    History  Alcohol Use  . Yes    Comment: 3-5/week, wine on the weekend      Allergies   Allergen Reactions  . Codeine Itching    itching    Current Facility-Administered Medications  Medication Dose Route Frequency Provider Last Rate Last Dose  . cefUROXime (ZINACEF) 1.5 g in dextrose 5 % 50 mL IVPB  1.5 g Intravenous 60 min Pre-Op Grace Isaac, MD      . dextrose 5 % with cefUROXime (ZINACEF) ADS Med               Review of Systems:     Cardiac Review of Systems: Y or N  Chest Pain [ n   ]  Resting SOB [ n  ] Exertional SOB  [ y ]  Orthopnea [ n ]   Pedal Edema [ n  ]    Palpitations [ n ] Syncope  [ n ]   Presyncope [n   ]  General Review of Systems: [Y] = yes [  ]=no Constitional: recent weight change [n  ];  Wt loss over the last 3 months [   ] anorexia [  ]; fatigue [n  ]; nausea [  ]; night sweats Blue.Reese  ]; fever [n  ]; or chills Florencio.Farrier  ];          Dental: poor dentition[ n ]; Last Dentist visit:   Eye : blurred vision [  ]; diplopia [   ]; vision changes [  ];  Amaurosis fugax[  ]; Resp: cough Blue.Reese  ];  wheezing[n  ];  hemoptysis[ n ]; shortness of breath[n  ]; paroxysmal nocturnal dyspnea[ n ]; dyspnea on exertion[ y ]; or orthopnea[ n ];  GI:  gallstones[  ], vomiting[  ];  dysphagia[  ]; melena[  ];  hematochezia [  ]; heartburn[  ];   Hx of  Colonoscopy[  ]; GU: kidney stones [  ]; hematuria[  ];   dysuria [  ];  nocturia[  ];  history of     obstruction [  ]; urinary frequency [  ]             Skin: rash, swelling[  ];, hair loss[  ];  peripheral edema[  ];  or itching[  ];  Musculosketetal: myalgias[  ];  joint swelling[  ];  joint erythema[ n ];  joint pain[  ];  back pain[n  ];  Heme/Lymph: bruising[n  ];  bleeding[  ];  anemia[  ];  Neuro: TIA[n  ];  headaches[  ];  stroke[  ];  vertigo[  ];  seizures[  ];   paresthesias[  ];  difficulty walking[n  ];  Psych:depression[  ]; anxiety[  ];  Endocrine: diabetes[  ];  thyroid dysfunction[  ];  Immunizations: Flu up to date [ y ]; Pneumococcal up to date [ y ];  Other:  Physical Exam: BP 126/86 (BP Location:  Right Arm)   Pulse 70   Temp 97.5 F (36.4 C) (Oral)   Resp 18   SpO2 99%   PHYSICAL EXAMINATION: General appearance: alert, cooperative, appears stated age and no distress Head: Normocephalic, without obvious abnormality, atraumatic Neck: no adenopathy, no carotid bruit, no JVD, supple, symmetrical, trachea midline and thyroid not enlarged, symmetric, no tenderness/mass/nodules Lymph nodes: Cervical, supraclavicular, and axillary nodes normal. Resp: clear to auscultation bilaterally Back: symmetric, no curvature. ROM normal. No CVA tenderness. Cardio: regular rate and rhythm, S1, S2 normal, no murmur, click, rub or gallop GI: soft, non-tender; bowel sounds normal; no masses,  no organomegaly Extremities: extremities normal, atraumatic, no cyanosis or edema and Homans sign is negative, no sign of DVT Neurologic: Grossly normal  Diagnostic Studies & Laboratory data:     Recent Radiology Findings:   CLINICAL DATA:  Productive cough for 1 year  EXAM: CT CHEST WITH CONTRAST  TECHNIQUE: Multidetector CT imaging of the chest was performed during intravenous contrast administration.  CONTRAST:  52m ISOVUE-300 IOPAMIDOL (ISOVUE-300) INJECTION 61%  COMPARISON:  None.  FINDINGS: Cardiovascular: The thoracic aorta is well visualized and shows no evidence of aneurysmal dilatation or dissection. The left vertebral artery arises directly from the aortic arch. The pulmonary artery shows no large central pulmonary embolism. Cardiac structures are within normal limits. No coronary calcifications are seen.  Mediastinum/Nodes: The thoracic inlet demonstrates evidence of a 9 mm short axis left supraclavicular lymph node. Multiple mediastinal lymph nodes are identified. A conglomeration of nodes is noted lying between the left common carotid artery and right innominate artery which measures approximately 13 cm in short axis. Multiple smaller lymph nodes are noted scattered about the  origins of the brachiocephalic vessels. A 10-11 mm short axis right peritracheal lymph node is noted as well as a 15 mm pretracheal lymph node. 13 mm AP window node and 19 mm sub carinal node is noted. Multiple small 1 cm and less hilar lymph nodes are identified bilaterally.  Lungs/Pleura: The lungs are well aerated bilaterally. A single calcified pleural plaque is noted on the left posteriorly best seen on image number 25 of series 3. No sizable effusion or pneumothorax is noted.  Upper Abdomen: No acute abnormality is noted. No lymphadenopathy is seen.  Musculoskeletal: Bony structures are within normal limits. Bilateral breast implants are seen.  IMPRESSION: Multiple hilar and mediastinal lymph nodes without definitive lung abnormality. These are of uncertain etiology but may represent a chronic change such as sarcoid or possibly lymphoma. CT of the abdomen and pelvis is recommended to assess for any other lymphadenopathy. Bronchoscopic guided tissue sampling may also be helpful.  Tiny calcified pleural plaque on the left. No other focal abnormality is seen.   Electronically Signed   By: MInez CatalinaM.D.   On: 01/31/2016 14:42  CLINICAL DATA:  Follow-up abnormal chest CT.  EXAM: CT ABDOMEN AND PELVIS WITH CONTRAST  TECHNIQUE: Multidetector CT imaging of the abdomen and pelvis was performed using the standard protocol following bolus administration of intravenous contrast.  CONTRAST:  134m ISOVUE-300 IOPAMIDOL (ISOVUE-300) INJECTION 61%  COMPARISON:  Chest CT 01/31/2016  FINDINGS: Lower chest: Lung bases are clear. No effusions. Heart is normal size.  Hepatobiliary: No focal hepatic abnormality. Gallbladder unremarkable.  Pancreas: No focal abnormality or ductal dilatation.  Spleen: No focal abnormality.  Normal size.  Adrenals/Urinary Tract: No adrenal abnormality. No focal renal abnormality. No stones or hydronephrosis. Urinary bladder  is unremarkable.  Stomach/Bowel: Stomach, large and small bowel grossly unremarkable.  Vascular/Lymphatic: Aorta and iliac vessels are normal caliber. No retroperitoneal or inguinal adenopathy.  Reproductive:  Prior hysterectomy.  No adnexal mass.  Other: No free fluid or free air.  Musculoskeletal: No acute bony abnormality or focal bone lesion.  IMPRESSION: No evidence of adenopathy in the abdomen or pelvis.  No acute findings.   Electronically Signed   By: KRolm BaptiseM.D.   On: 02/06/2016 10:34    Nm Pet Image Initial (pi) Skull Base To Thigh  Result Date: 02/28/2016 CLINICAL DATA:  Initial Treatment strategy for mediastinal adenopathy was negative for malignancy at cytology. EXAM: NUCLEAR MEDICINE PET SKULL BASE TO THIGH TECHNIQUE: 12.5 mCi F-18 FDG was injected intravenously. Full-ring PET imaging was performed from the skull base to thigh after the radiotracer. CT data was obtained and used for attenuation correction and anatomic localization. FASTING BLOOD GLUCOSE:  Value: 111 mg/dl COMPARISON:  01/31/2016 chest CT FINDINGS: NECK No hypermetabolic lymph nodes in the neck. CHEST Mediastinal and bilateral hilar adenopathy is again noted, a right paratracheal lymph node measures 1.5 cm in short axis on image 77/3 and previously measured 1.6 cm; this node has a maximum standard uptake value of 8.0. The other prevascular, AP window, and subcarinal lymph nodes are likewise hypermetabolic with the subcarinal node having maximum SUV 9.6. Faint bilateral hilar activity although less striking than the mediastinal activity. Background mediastinal blood pool SUV is 2.5. A small left supraclavicular node on image 54 series 3 measures 6 mm in short axis but is only very faintly hypermetabolic, maximum SUV 3.0. No axillary adenopathy. No compelling peribronchovascular or pleural-based nodularity in the lungs ; there is some mild scarring or atelectasis in the right middle lobe and  lingula. Bilateral breast implants. ABDOMEN/PELVIS No abnormal hypermetabolic activity within the liver, pancreas, adrenal glands, or spleen. No hypermetabolic lymph nodes in the abdomen or pelvis. SKELETON No focal hypermetabolic activity to suggest skeletal metastasis. IMPRESSION: 1. Hypermetabolic mediastinal adenopathy. Appearance suggests either malignancy or granulomatous process. On recent biopsies sampling of several lymph nodes was negative for malignancy. Granulomatous processes such as sarcoidosis or infection with certain agents such as histoplasmosis or tuberculosis could cause adenopathy without findings of malignancy. No specific intrapulmonary findings of sarcoidosis (no peribronchovascular nodularity or significant degree of subpleural nodularity). Electronically Signed   By: WVan ClinesM.D.   On: 02/28/2016 12:06     I have independently reviewed the above radiology studies  and reviewed the findings with the patient.   Recent Lab Findings: Lab Results  Component Value Date   WBC 7.3 03/26/2016   HGB 14.3 03/26/2016   HCT 41.2 03/26/2016   PLT 218 03/26/2016   GLUCOSE 107 (H) 03/26/2016   CHOL 233 (H) 02/01/2016   TRIG 185 (H) 02/01/2016   HDL 58 02/01/2016   LDLCALC 138 (H) 02/01/2016   ALT 27 03/26/2016  AST 34 03/26/2016   NA 140 03/26/2016   K 3.5 03/26/2016   CL 103 03/26/2016   CREATININE 0.88 03/26/2016   BUN 10 03/26/2016   CO2 29 03/26/2016   TSH 3.770 02/01/2016   INR 0.93 03/26/2016   HISTOPLASMA GAL'MANNAN AG UR - negative  Quantiferon gold - neg Cryptococcus antigen- neg  M tuberculosis complex Negative   M avium complex PositiveVC    Comments: CORRECTED ON 12/13 AT 1932: PREVIOUSLY REPORTED AS Negative  M kansasii Not Indicated   M gordonae Not Indicated   Other: NAIDN   Comments: No additional identification testing is necessary.  Susceptibility Testing See reflex.VC   Comments: (NOTE)  Performed At: [facility]  [address] [place], [place] [id number]  Lindon Romp MD DS:[id number]      Assessment / Plan:    1/Hypermetabolic mediastinal adenopathy-  suggests either malignancy or granulomatous process. Recent EBUS  biopsies were  negative for malignancy. Granulomatous processes such as sarcoidosis or infection with certain agents such as histoplasmosis or tuberculosis could cause adenopathy  2/ /M avium  complex PositiveVC       I discussed the findings of diffuse mediastinal adenopathy with the patient and her husband  without any definitive diagnosis as of yet agree with recommendation by Dr. Faith Rogue to proceed with mediastinoscopy, the risks and options of surgery are discussed with the patient in detail. I do not think it would be advantageous to repeat ebus as we would likely end up with the same result. Further tissue both for cultures and histology for tissue architecture is needed to make a definitive diagnosis. The patient is willing to proceed   The goals risks and alternatives of the planned surgical procedure mediastinoscopy have been discussed with the patient in detail. The risks of the procedure including death, infection, stroke, myocardial infarction, bleeding, blood transfusion have all been discussed specifically.  I have quoted Barbara Frederick a 1 % of perioperative mortality and a complication rate as high as 10 %. The patient's questions have been answered.Barbara Frederick is willing  to proceed with the planned procedure.   Grace Isaac MD      [address] [place] [address] Office [phone]   Beeper [phone]  03/29/2016 7:11 AM

## 2016-03-29 NOTE — Discharge Instructions (Signed)
Flexible Bronchoscopy &  Mediastinoscopy , Care After These instructions give you information on caring for yourself after your procedure. Your doctor may also give you more specific instructions. Call your doctor if you have any problems or questions after your procedure. Follow these instructions at home:  Do not eat or drink anything for 2 hours after your procedure. If you try to eat or drink before the medicine wears off, food or drink could go into your lungs. You could also burn yourself.  After 2 hours have passed and when you can cough and gag normally, you may eat soft food and drink liquids slowly.  The day after the test, you may eat your normal diet.  You may do your normal activities.  Keep all doctor visits. Get help right away if:  You get more and more short of breath.  You get light-headed.  You feel like you are going to pass out (faint).  You have chest pain.  You have new problems that worry you.  You cough up more than a little blood.  You cough up more blood than before. This information is not intended to replace advice given to you by your health care provider. Make sure you discuss any questions you have with your health care provider. Document Released: 01/06/2009 Document Revised: 08/17/2015 Document Reviewed: 11/13/2012 Elsevier Interactive Patient Education  2017 [employer].

## 2016-03-29 NOTE — Brief Op Note (Signed)
      [address]       [place] [address]             [phone]      03/29/2016  9:25 AM  PATIENT:  Barbara Frederick  46 y.o. female  PRE-OPERATIVE DIAGNOSIS:  MEDIASTINAL LYMPHADENOPATHY  POST-OPERATIVE DIAGNOSIS:  MEDIASTINAL LYMPHADENOPATHY/  inflammatory no malignancy on frozen section   PROCEDURE:  Procedure(s): Video MEDIASTINOSCOPY WITH LYMPH NODE BIOPSY (N/A) VIDEO BRONCHOSCOPY (N/A)  SURGEON:  Surgeon(s) and Role:    * Grace Isaac, MD - Primary    ANESTHESIA:   general  EBL:  Total I/O In: 1000 [I.V.:1000] Out: 20 [Blood:20]  BLOOD ADMINISTERED:none  DRAINS: none   LOCAL MEDICATIONS USED:  NONE  SPECIMEN:  Source of Specimen:  mediastinal nodes   DISPOSITION OF SPECIMEN:  PATHOLOGY and micro cultures   COUNTS:  YES   DICTATION: .Dragon Dictation  PLAN OF CARE: plan d/c home today  PATIENT DISPOSITION:  PACU - hemodynamically stable.   Delay start of Pharmacological VTE agent (>24hrs) due to surgical blood loss or risk of bleeding: yes

## 2016-03-29 NOTE — Transfer of Care (Signed)
Immediate Anesthesia Transfer of Care Note  Patient: Barbara Frederick  Procedure(s) Performed: Procedure(s): Video MEDIASTINOSCOPY WITH LYMPH NODE BIOPSY (N/A) VIDEO BRONCHOSCOPY (N/A)  Patient Location: PACU  Anesthesia Type:General  Level of Consciousness: awake, alert  and oriented  Airway & Oxygen Therapy: Patient Spontanous Breathing and Patient connected to nasal cannula oxygen  Post-op Assessment: Report given to RN, Post -op Vital signs reviewed and stable and Patient moving all extremities X 4  Post vital signs: Reviewed and stable  Last Vitals:  Vitals:   03/29/16 0551  BP: 126/86  Pulse: 70  Resp: 18  Temp: 36.4 C    Last Pain:  Vitals:   03/29/16 0551  TempSrc: Oral         Complications: No apparent anesthesia complications

## 2016-03-29 NOTE — Anesthesia Preprocedure Evaluation (Addendum)
Anesthesia Evaluation  Patient identified by MRN, date of birth, ID band Patient awake    Reviewed: Allergy & Precautions, NPO status , Patient's Chart, lab work & pertinent test results  Airway Mallampati: II  TM Distance: >3 FB     Dental  (+) Teeth Intact, Dental Advisory Given   Pulmonary neg pulmonary ROS,    breath sounds clear to auscultation       Cardiovascular negative cardio ROS   Rhythm:Regular Rate:Normal     Neuro/Psych    GI/Hepatic Neg liver ROS, GERD  ,  Endo/Other  negative endocrine ROS  Renal/GU negative Renal ROS     Musculoskeletal  (+) Arthritis ,   Abdominal   Peds  Hematology   Anesthesia Other Findings   Reproductive/Obstetrics                            Anesthesia Physical Anesthesia Plan  ASA: III  Anesthesia Plan: General   Post-op Pain Management:    Induction: Intravenous  Airway Management Planned: Oral ETT  Additional Equipment:   Intra-op Plan:   Post-operative Plan: Possible Post-op intubation/ventilation  Informed Consent: I have reviewed the patients History and Physical, chart, labs and discussed the procedure including the risks, benefits and alternatives for the proposed anesthesia with the patient or authorized representative who has indicated his/her understanding and acceptance.   Dental advisory given  Plan Discussed with: CRNA, Anesthesiologist and Surgeon  Anesthesia Plan Comments:         Anesthesia Quick Evaluation

## 2016-03-30 ENCOUNTER — Encounter (HOSPITAL_COMMUNITY): Payer: Self-pay | Admitting: Cardiothoracic Surgery

## 2016-03-31 LAB — ACID FAST SMEAR (AFB, MYCOBACTERIA)
Acid Fast Smear: NEGATIVE
Acid Fast Smear: NEGATIVE

## 2016-03-31 LAB — CULTURE, RESPIRATORY W GRAM STAIN: Culture: NO GROWTH

## 2016-04-01 NOTE — Op Note (Signed)
NAMEANAJULIA, WOLSKE NO.:  [id number]  MEDICAL RECORD NO.:  [id number]  LOCATION:                                 FACILITY:  PHYSICIAN:  Lanelle Bal, MD    DATE OF BIRTH:  1970/09/26  DATE OF PROCEDURE:  03/29/2016 DATE OF DISCHARGE:  03/29/2016                              OPERATIVE REPORT   PREOPERATIVE DIAGNOSIS:  Mediastinal adenopathy.  POSTOPERATIVE DIAGNOSIS:  Mediastinal adenopathy.  No evidence of malignancy by frozen section.  Further results pending.  PROCEDURE PERFORMED:  Bronchoscopy and mediastinoscopy with lymph node biopsy.  SURGEON:  Lanelle Bal, MD.  BRIEF HISTORY:  The patient is a 46 year old female, who presented several months previous with increasing cough.  Further evaluation including chest x-ray, CT scan of the chest, and PET scan confirmed extensive mediastinal adenopathy, which was hypermetabolic. Attempts at bronchoscopy and EBUS did not reveal any definitive diagnosis.  The patient did grow Mycobacterium avium from bronchial washings.  She was referred for consideration of mediastinal lymph node biopsy.  The risks and options of mediastinoscopy were discussed with the patient in detail and she was agreeable and willing to proceed.  DESCRIPTION OF PROCEDURE:  The patient underwent general endotracheal anesthesia without incident.  Appropriate time-out was performed, and we then proceeded with a 2 mm bronchoscope through the endotracheal tube looking at the right and left tracheobronchial tree to the subsegmental level without evidence of endobronchial lesions.  There was very minimal degree of secretions.  Bronchial washings were obtained and a culture sent for routine and also specifically for AFB smear and culture.  The scope was then removed.  The patient's neck was prepped with Betadine and draped in sterile manner.  A small incision was made in the suprasternal notch and dissection carried down to the  pretracheal fascia.  We dissected down along the anterior wall of the trachea relatively short distance and there was very extensive mediastinal adenopathy through the mediastinoscope.  Portion of this was sent for initial frozen section, which was consistent with inflammatory disease. We proceeded with further dissection and removed a significant amount of lymph tissue both for permanent histology.  Portion was also sent for routine AFB and fungal smears and cultures.  The remaining lymph node tissue was sent to pathology in a fresh state for further evaluation. With the operative hemostatic, interrupted 3-0 Vicryl were placed in the deeper fascial tissue and 4-0 subcuticular stitch in skin edge.  Dermabond was applied.  The patient was awakened and extubated.  The sponge and needle count was reported as correct.  She tolerated the procedure without complication, was transferred to the recovery room for postoperative observation with a plan for discharge home.  Blood loss was minimal.     Lanelle Bal, MD     EG/MEDQ  D:  03/29/2016  T:  03/30/2016  Job:  [id number]

## 2016-04-03 LAB — ANAEROBIC CULTURE

## 2016-04-03 LAB — AEROBIC/ANAEROBIC CULTURE W GRAM STAIN (SURGICAL/DEEP WOUND): Culture: NO GROWTH

## 2016-04-04 ENCOUNTER — Ambulatory Visit (INDEPENDENT_AMBULATORY_CARE_PROVIDER_SITE_OTHER): Payer: 59 | Admitting: Cardiothoracic Surgery

## 2016-04-04 VITALS — BP 108/78 | HR 75 | Resp 16 | Ht 68.0 in | Wt 156.0 lb

## 2016-04-04 DIAGNOSIS — D71 Functional disorders of polymorphonuclear neutrophils: Secondary | ICD-10-CM

## 2016-04-04 DIAGNOSIS — R590 Localized enlarged lymph nodes: Secondary | ICD-10-CM

## 2016-04-04 DIAGNOSIS — Z09 Encounter for follow-up examination after completed treatment for conditions other than malignant neoplasm: Secondary | ICD-10-CM | POA: Diagnosis not present

## 2016-04-04 NOTE — Progress Notes (Signed)
[address]       [place] [address]             [phone]      Grettell W Ohm [facility] Medical Record [id number] Date of Birth: 1970-04-22  Referring: Nestor Lewandowsky, MD Primary Care: Wilhemena Durie, MD  Chief Complaint:   POST OP FOLLOW UP  History of Present Illness:     Patient returns today after recent mediastinoscopy. The patient has done well postoperatively, he's had little discomfort. She is taking no pain medication.  The pathology report shows noncaseating granulomas. So far smears for AFB, fungal, routine have been negative for organisms. Cultures for atypical mycobacterium from the bronchial washings and frozen tissue are still pending.      Past Medical History:  Diagnosis Date  . Allergy   . Anxiety   . Arthritis    L knee- OA  . Complication of anesthesia    needed smaller ETT (anes unable to pass 8.0 or 8.5 ETT 02/13/16; 7.0 ETT used 08/24/07)     History  Smoking Status  . Never Smoker  Smokeless Tobacco  . Never Used    History  Alcohol Use  . Yes    Comment: 3-5/week, wine on the weekend      Allergies  Allergen Reactions  . Codeine Itching    itching    Current Outpatient Prescriptions  Medication Sig Dispense Refill  . ALPRAZolam (XANAX) 0.5 MG tablet Take 1 tablet (0.5 mg total) by mouth 2 (two) times daily as needed for anxiety. Reported on 04/05/2015 (Patient taking differently: Take 0.5 mg by mouth daily as needed for anxiety. Reported on 04/05/2015) 60 tablet 5  . buPROPion (WELLBUTRIN XL) 150 MG 24 hr tablet Take 1 tablet (150 mg total) by mouth daily. 30 tablet 5  . doxycycline (ORACEA) 40 MG capsule TAKE ONE CAPSULE BY MOUTH WITH food AND plenty OF IN FLUID  3  . loratadine (CLARITIN) 10 MG tablet Take 10 mg by mouth daily as needed for allergies.     . mometasone (NASONEX) 50 MCG/ACT nasal spray Place 2 sprays into the nose daily as needed (congestion).     . Multiple Vitamin (MULTI-VITAMINS)  TABS Take 1 tablet by mouth daily.     . RHOFADE 1 % CREA APPLY TO THE AFFECTED AREA EVERY MORNING  3   No current facility-administered medications for this visit.        Physical Exam: BP 108/78 (BP Location: Right Arm, Patient Position: Sitting, Cuff Size: Large)   Pulse 75   Resp 16   Ht 5\' 8"  (1.727 m)   Wt 70.8 kg (156 lb)   SpO2 99% Comment: ON RA  BMI 23.72 kg/m   General appearance: alert and cooperative Neurologic: intact Heart: regular rate and rhythm, S1, S2 normal, no murmur, click, rub or gallop Lungs: clear to auscultation bilaterally Abdomen: soft, non-tender; bowel sounds normal; no masses,  no organomegaly Extremities: extremities normal, atraumatic, no cyanosis or edema and Homans sign is negative, no sign of DVT Wound: The mediastinoscopy incision is well-healed very slight swelling on the upper incision.   Diagnostic Studies & Laboratory data:      Recent Lab Findings: Lab Results  Component Value Date   WBC 7.3 03/26/2016   HGB 14.3 03/26/2016   HCT 41.2 03/26/2016   PLT 218 03/26/2016   GLUCOSE 107 (H) 03/26/2016   CHOL 233 (H) 02/01/2016   TRIG 185 (H) 02/01/2016  HDL 58 02/01/2016   LDLCALC 138 (H) 02/01/2016   ALT 27 03/26/2016   AST 34 03/26/2016   NA 140 03/26/2016   K 3.5 03/26/2016   CL 103 03/26/2016   CREATININE 0.88 03/26/2016   BUN 10 03/26/2016   CO2 29 03/26/2016   TSH 3.770 02/01/2016   INR 0.93 03/26/2016      Assessment / Plan:      Patient with recent biopsy of mediastinal adenopathy showing noncaseating granulomas, so far negative cultures and smears. Ace= 66  Patient will be referred to pulmonary for further evaluation and treatment options.   Grace Isaac MD      [address] [place] [address] Office [phone]   Beeper [phone]  04/04/2016 10:05 AM

## 2016-04-12 ENCOUNTER — Ambulatory Visit (INDEPENDENT_AMBULATORY_CARE_PROVIDER_SITE_OTHER): Payer: 59 | Admitting: Cardiothoracic Surgery

## 2016-04-12 DIAGNOSIS — J9859 Other diseases of mediastinum, not elsewhere classified: Secondary | ICD-10-CM

## 2016-04-12 NOTE — Progress Notes (Signed)
  Patient ID: MACY MINTEN, female   DOB: 04-Apr-1970, 46 y.o.   MRN: [id number]  HISTORY: Miss Hojnacki called today and aspirin look at her incision. She underwent a mediastinal endoscopy a couple weeks ago and the incision is now well-healed. There is some Dermabond still remaining but there is been no erythema or drainage from the wound. She's had no pain. She's actually tolerated the procedure very well. Her only complaint today is that she did have a cough which has been productive but has had no fever. She's also had some lower chest wall pain from coughing but has had no pain in her anterior chest or mediastinum. She has not had any fevers under appetites been good.   There were no vitals filed for this visit.   EXAM:    Resp: Lungs are clear bilaterally.  No respiratory distress, normal effort. Heart:  Regular without murmurs Neurological: Alert and oriented to person, place, and time. Coordination normal.  Skin: Skin is warm and dry. No rash noted. No diaphoretic. No erythema. No pallor.  Psychiatric: Normal mood and affect. Normal behavior. Judgment and thought content normal.   Herman mediastinal endoscopy wound is clean dry and intact. There is no erythema or drainage.   ASSESSMENT: Status post mediastinal endoscopy for noncaseating granulomatous disease. I believe that her cough is most likely viral in nature. I did tell her to call me if she has continued symptoms.   PLAN:   She will follow-up with her primary care physician. She will also follow-up with Dr. Adrian Prows and infectious disease regarding the need for treatment of her noncaseating granulomatous inflammation.    Nestor Lewandowsky, MD

## 2016-04-15 ENCOUNTER — Ambulatory Visit: Payer: 59 | Admitting: Internal Medicine

## 2016-04-15 ENCOUNTER — Ambulatory Visit: Payer: Self-pay | Admitting: Physician Assistant

## 2016-04-15 DIAGNOSIS — J018 Other acute sinusitis: Secondary | ICD-10-CM

## 2016-04-15 DIAGNOSIS — R05 Cough: Secondary | ICD-10-CM

## 2016-04-15 DIAGNOSIS — R059 Cough, unspecified: Secondary | ICD-10-CM

## 2016-04-16 NOTE — Progress Notes (Signed)
See notes handwritten by NP

## 2016-04-19 LAB — CULTURE, FUNGUS WITHOUT SMEAR

## 2016-04-23 ENCOUNTER — Encounter: Payer: Self-pay | Admitting: Family Medicine

## 2016-04-23 ENCOUNTER — Ambulatory Visit (INDEPENDENT_AMBULATORY_CARE_PROVIDER_SITE_OTHER): Payer: 59 | Admitting: Family Medicine

## 2016-04-23 VITALS — BP 100/70 | HR 68 | Temp 97.5°F | Resp 16 | Wt 160.0 lb

## 2016-04-23 DIAGNOSIS — R059 Cough, unspecified: Secondary | ICD-10-CM

## 2016-04-23 DIAGNOSIS — F339 Major depressive disorder, recurrent, unspecified: Secondary | ICD-10-CM

## 2016-04-23 DIAGNOSIS — R590 Localized enlarged lymph nodes: Secondary | ICD-10-CM

## 2016-04-23 DIAGNOSIS — F419 Anxiety disorder, unspecified: Secondary | ICD-10-CM | POA: Diagnosis not present

## 2016-04-23 DIAGNOSIS — R05 Cough: Secondary | ICD-10-CM

## 2016-04-23 NOTE — Progress Notes (Signed)
Patient: Barbara Frederick Female    DOB: 08-10-1970   46 y.o.   MRN: [id number] Visit Date: 04/23/2016  Today's Provider: Wilhemena Durie, MD   Chief Complaint  Patient presents with  . Depression  . Cough   Subjective:    Cough  The current episode started 1 to 4 weeks ago (x 2 weeks). The problem has been unchanged. The cough is productive of sputum (yellow). Associated symptoms include ear congestion, nasal congestion, postnasal drip, shortness of breath and wheezing. Pertinent negatives include no chest pain, chills, ear pain, fever, headaches, hemoptysis, rhinorrhea, sore throat or sweats. Treatments tried: Pt saw PA at [facility] on 04/15/2016 who prescribed pt Ceftin and Tessalon Perles. The treatment provided no relief. Her past medical history is significant for environmental allergies (dust, dogs). There is no history of asthma, COPD or pneumonia.  Pt has had cough since the fall of 2017, but recent URI sx are exacerbating the cough.    Anxiety: Patient complains of anxiety and depression.  She has the following symptoms: none. Symptoms are gradually improving. Current treatment includes Wellbutrin and Xanax She complains of the following side effects from the treatment: none.     Allergies  Allergen Reactions  . Codeine Itching    itching     Current Outpatient Prescriptions:  .  ALPRAZolam (XANAX) 0.5 MG tablet, Take 1 tablet (0.5 mg total) by mouth 2 (two) times daily as needed for anxiety. Reported on 04/05/2015 (Patient taking differently: Take 0.5 mg by mouth daily as needed for anxiety. Reported on 04/05/2015), Disp: 60 tablet, Rfl: 5 .  buPROPion (WELLBUTRIN XL) 150 MG 24 hr tablet, Take 1 tablet (150 mg total) by mouth daily., Disp: 30 tablet, Rfl: 5 .  cefUROXime (CEFTIN) 500 MG tablet, , Disp: , Rfl: 0 .  doxycycline (ORACEA) 40 MG capsule, TAKE ONE CAPSULE BY MOUTH WITH food AND plenty OF IN FLUID, Disp: , Rfl: 3 .  loratadine (CLARITIN) 10 MG tablet, Take  10 mg by mouth daily as needed for allergies. , Disp: , Rfl:  .  mometasone (NASONEX) 50 MCG/ACT nasal spray, Place 2 sprays into the nose daily as needed (congestion). , Disp: , Rfl:  .  Multiple Vitamin (MULTI-VITAMINS) TABS, Take 1 tablet by mouth daily. , Disp: , Rfl:  .  RHOFADE 1 % CREA, APPLY TO THE AFFECTED AREA EVERY MORNING, Disp: , Rfl: 3 .  benzonatate (TESSALON) 200 MG capsule, , Disp: , Rfl: 0  Review of Systems  Constitutional: Positive for fatigue. Negative for chills, diaphoresis and fever.  HENT: Positive for postnasal drip. Negative for ear pain, rhinorrhea and sore throat.   Respiratory: Positive for cough, shortness of breath and wheezing. Negative for hemoptysis.   Cardiovascular: Negative for chest pain.  Allergic/Immunologic: Positive for environmental allergies (dust, dogs).  Neurological: Negative for headaches.  Psychiatric/Behavioral: The patient is not nervous/anxious.     Social History  Substance Use Topics  . Smoking status: Never Smoker  . Smokeless tobacco: Never Used  . Alcohol use Yes     Comment: 3-5/week, wine on the weekend    Objective:   BP 100/70 (BP Location: Right Arm, Patient Position: Sitting, Cuff Size: Normal)   Pulse 68   Temp 97.5 F (36.4 C) (Oral)   Resp 16   Wt 160 lb (72.6 kg)   SpO2 98%   BMI 24.33 kg/m   Physical Exam  Constitutional: She appears well-developed and well-nourished.  HENT:  Head: Normocephalic.  Right Ear: Tympanic membrane normal.  Left Ear: Tympanic membrane normal.  Nose: Nose normal. Right sinus exhibits no maxillary sinus tenderness and no frontal sinus tenderness. Left sinus exhibits no maxillary sinus tenderness and no frontal sinus tenderness.  Mouth/Throat: Oropharynx is clear and moist.  Eyes: Conjunctivae are normal.  Neck: Normal range of motion. Neck supple. No thyromegaly present.  Cardiovascular: Normal rate, regular rhythm and normal heart sounds.   Pulmonary/Chest: Effort normal and  breath sounds normal. No respiratory distress.  Psychiatric: She has a normal mood and affect. Her behavior is normal.        Assessment & Plan:     1. Cough Unclear etiology at this time. Could be secondary to sarcoid and exacerbated by cough. Sarcoid could be causing RAD. Sample of Dulera 148mcg/5mcg given. Pt taught how to use steroid in office. Advised pt to wash mouth out after use to prevent oral thrush. Pt will try the inhaler, and call if she wants prescription sent to pharmacy. Try OTC Mucinex.Pulmonary referral.  2. Anxiety Stable. Continue current medications and plan of care.  3. Episode of recurrent major depressive disorder, unspecified depression episode severity ([place]) Stable. Continue current medications and plan of care.  4. Lymphadenopathy, mediastinal Most likely chronic sarcoid. Waiting 4 more weeks for final test results. Continue work up and seeing specialists.    Patient seen and examined by Miguel Aschoff, MD, and note scribed by Renaldo Fiddler, CMA. I have done the exam and reviewed the above chart and it is accurate to the best of my knowledge. Development worker, community has been used in this note in any air is in the dictation or transcription are unintentional.  Wilhemena Durie, MD  [place]

## 2016-04-23 NOTE — Patient Instructions (Addendum)
Call office if you would like prescription of [facility] sent to your pharmacy. Try OTC Mucinex for cough (expectorant).

## 2016-04-25 ENCOUNTER — Telehealth: Payer: Self-pay

## 2016-04-25 ENCOUNTER — Other Ambulatory Visit: Payer: Self-pay

## 2016-04-25 MED ORDER — LEVOFLOXACIN 500 MG PO TABS: 500.0000 mg | ORAL_TABLET | Freq: Every day | ORAL | 0 refills | Status: DC

## 2016-04-25 NOTE — Telephone Encounter (Signed)
Patient was seen for cough last week.  Today was her last day of Ceftin.  She has been using Dulera and it has helped the cough well.  She is now having increased sinus symptoms.  Pain and pressure of her teeth and cheeks.  Yellow phlegm.  She is using Mucinex and saline irrigations.  She has to travel to [place] next week and would like to get something that will help get this better by then,.  She uses [facility].  Thanks ED

## 2016-05-13 LAB — ACID FAST CULTURE WITH REFLEXED SENSITIVITIES (MYCOBACTERIA)
Acid Fast Culture: NEGATIVE
Acid Fast Culture: NEGATIVE

## 2016-05-16 ENCOUNTER — Other Ambulatory Visit: Payer: Self-pay | Admitting: Obstetrics & Gynecology

## 2016-05-16 DIAGNOSIS — Z1231 Encounter for screening mammogram for malignant neoplasm of breast: Secondary | ICD-10-CM

## 2016-06-10 ENCOUNTER — Ambulatory Visit (INDEPENDENT_AMBULATORY_CARE_PROVIDER_SITE_OTHER): Payer: 59 | Admitting: Pulmonary Disease

## 2016-06-10 ENCOUNTER — Ambulatory Visit
Admission: RE | Admit: 2016-06-10 | Discharge: 2016-06-10 | Disposition: A | Discharge status: Home / Self Care | Payer: 59 | Source: Ambulatory Visit | Attending: Obstetrics & Gynecology | Admitting: Obstetrics & Gynecology

## 2016-06-10 ENCOUNTER — Ambulatory Visit: Payer: Self-pay | Admitting: Internal Medicine

## 2016-06-10 ENCOUNTER — Encounter: Payer: Self-pay | Admitting: Pulmonary Disease

## 2016-06-10 VITALS — BP 102/80 | HR 84 | Ht 68.0 in | Wt 160.6 lb

## 2016-06-10 DIAGNOSIS — R0609 Other forms of dyspnea: Secondary | ICD-10-CM

## 2016-06-10 DIAGNOSIS — R05 Cough: Secondary | ICD-10-CM

## 2016-06-10 DIAGNOSIS — R002 Palpitations: Secondary | ICD-10-CM | POA: Diagnosis not present

## 2016-06-10 DIAGNOSIS — R590 Localized enlarged lymph nodes: Secondary | ICD-10-CM

## 2016-06-10 DIAGNOSIS — Z1231 Encounter for screening mammogram for malignant neoplasm of breast: Secondary | ICD-10-CM | POA: Diagnosis not present

## 2016-06-10 DIAGNOSIS — I7300 Raynaud's syndrome without gangrene: Secondary | ICD-10-CM

## 2016-06-10 DIAGNOSIS — R059 Cough, unspecified: Secondary | ICD-10-CM

## 2016-06-10 NOTE — Patient Instructions (Signed)
   Call or e-mail me if you have any new breathing problems or questions before your next appointment.  I will see you back in a few weeks to review your test results.  Remember to schedule an appointment with an ophthalmologist to be evaluated for eye involvement of your sarcoidosis.  TESTS ORDERED: 1. Full PFTs before next appointment 2. 28/30 Day Holter Monitor 3. Serum ESR, CRP, ACE, ANA, SCL-70, Centromere Antibody Screen, & Sjogren's Panel.

## 2016-06-10 NOTE — Progress Notes (Signed)
Subjective:    Patient ID: Barbara Frederick, female    DOB: 1970/07/15, 46 y.o.   MRN: [id number]  C.C.:  Follow-up for NTM Infection & Mediastinal/Hilar Lymphadenopathy.   HPI Cough:  She reports she has had a cough for approximately 1 year. She reports her cough is nonproductive. She reports her cough is primarily with exercise and is fairly mild.   NTM Infection: Patient previously underwent bronchoscopy with lavage, culture, and biopsies in November 2017. Mycobacterium avium complex was isolated. Repeat cultures from the patient's mediastinoscopy were performed on 03/29/16 which were negative. Although, no lavage or transbronchial biopsies were performed. Denies any hemoptysis.   Mediastinal/Hilar Lymphadenopathy: Patient found to have noncaseating granulomatous lymphadenitis based on mediastinoscopy on 03/29/16. Cultures negative for AFB. She does have wheezing occasionally with exercise. She does have a mild progression in her dyspnea with exercise. No focal weakness, numbness or tingling. Patient was given a Dulera inhaler after she became ill post her mediastinoscopy with resolution of her cough & breathing problems very quickly.   Review of Systems She does report some "tightness" in her chest with exercise. No chest pain or pressure. She reports she has had occasional palpitations intermittently. She denies any syncope or near syncope. No eye swelling, pain, or erythema. She does have Ranyaud's.   Allergies  Allergen Reactions  . Codeine Itching    itching    Current Outpatient Prescriptions on File Prior to Visit  Medication Sig Dispense Refill  . ALPRAZolam (XANAX) 0.5 MG tablet Take 1 tablet (0.5 mg total) by mouth 2 (two) times daily as needed for anxiety. Reported on 04/05/2015 (Patient taking differently: Take 0.5 mg by mouth daily as needed for anxiety. Reported on 04/05/2015) 60 tablet 5  . buPROPion (WELLBUTRIN XL) 150 MG 24 hr tablet Take 1 tablet (150 mg total) by mouth  daily. 30 tablet 5  . loratadine (CLARITIN) 10 MG tablet Take 10 mg by mouth daily as needed for allergies.     . mometasone (NASONEX) 50 MCG/ACT nasal spray Place 2 sprays into the nose daily as needed (congestion).     . RHOFADE 1 % CREA APPLY TO THE AFFECTED AREA EVERY MORNING  3  . benzonatate (TESSALON) 200 MG capsule   0  . cefUROXime (CEFTIN) 500 MG tablet   0  . doxycycline (ORACEA) 40 MG capsule TAKE ONE CAPSULE BY MOUTH WITH food AND plenty OF IN FLUID  3  . levofloxacin (LEVAQUIN) 500 MG tablet Take 1 tablet (500 mg total) by mouth daily. (Patient not taking: Reported on 06/10/2016) 7 tablet 0  . Multiple Vitamin (MULTI-VITAMINS) TABS Take 1 tablet by mouth daily.      No current facility-administered medications on file prior to visit.     Past Medical History:  Diagnosis Date  . Allergy   . Anxiety   . Arthritis    L knee- OA  . Complication of anesthesia    needed smaller ETT (anes unable to pass 8.0 or 8.5 ETT 02/13/16; 7.0 ETT used 08/24/07)    Past Surgical History:  Procedure Laterality Date  . ABDOMINAL HYSTERECTOMY  2010   Dr Amalia Hailey  . AUGMENTATION MAMMAPLASTY Bilateral 2001  . BREAST ENHANCEMENT SURGERY Bilateral [place] [employer]  . CYST EXCISION    . ENDOBRONCHIAL ULTRASOUND N/A 02/13/2016   Procedure: ENDOBRONCHIAL ULTRASOUND;  Surgeon: Laverle Hobby, MD;  Location: [facility] ORS;  Service: Cardiopulmonary;  Laterality: N/A;  . MEDIASTINOSCOPY N/A 03/29/2016   Procedure: Video  MEDIASTINOSCOPY WITH LYMPH NODE BIOPSY;  Surgeon: Grace Isaac, MD;  Location: [facility];  Service: Thoracic;  Laterality: N/A;  . OVARY SURGERY     one ovary and cervix at [facility]  . VAGINAL DELIVERY     twins   . VIDEO BRONCHOSCOPY N/A 03/29/2016   Procedure: VIDEO BRONCHOSCOPY;  Surgeon: Grace Isaac, MD;  Location: [facility] OR;  Service: Thoracic;  Laterality: N/A;    Family History  Problem Relation Age of Onset  . Cancer Paternal Grandmother 74    breast    . Hypertension Mother   . Raynaud syndrome Mother   . Emphysema Father   . Hypertension Brother   . Cancer Paternal Aunt 30    breast  . Colon cancer Maternal Grandfather   . Rectal cancer Maternal Grandfather   . Raynaud syndrome Maternal Grandfather   . Lung cancer Paternal Grandfather   . Emphysema Paternal Grandfather     Social History   Social History  . Marital status: Married    Spouse name: N/A  . Number of children: N/A  . Years of education: N/A   Social History Main Topics  . Smoking status: Passive Smoke Exposure - Never Smoker    Types: Cigarettes  . Smokeless tobacco: Never Used     Comment: Parents smoked around her.   . Alcohol use Yes     Comment: 3-5/week, wine on the weekend   . Drug use: No  . Sexual activity: Not Asked   Other Topics Concern  . None   Social History Narrative   [facility] (06/10/16):   Originally from [facility] [facility]. Previously has also lived in [place], [place], [place]. Previously has traveled to [place] a few years ago. She has had prior exposure to elephants who appeared ill. Has also traveled to [place]. Does have indoor dogs & an indoor cat. Brief bird exposure. No mold or hot tub exposure. 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 Enjoys playing tennis & skiing. She also enjoys gardening.       Objective:   Physical Exam BP 102/80 (BP Location: Right Arm, Patient Position: Sitting, Cuff Size: Normal)   Pulse 84   Ht _0  (1.727 m)   Wt 160 lb 9.6 oz (72.8 kg)   SpO2 97%   BMI 24.42 kg/m  General:  Awake. Alert. No acute distress. Caucasian female. Integument:  Warm & dry. No rash on exposed skin. No bruising on exposed skin. Extremities:  No cyanosis or clubbing.  HEENT:  Moist mucus membranes. No oral ulcers. No scleral injection or icterus. No nasal turbinate swelling. Cardiovascular:  Regular rate. No edema. No appreciable JVD.  Pulmonary:  Good aeration & clear to auscultation  bilaterally. Symmetric chest wall expansion. No accessory muscle use. Abdomen: Soft. Nondistended.  Musculoskeletal:  Normal bulk and tone. Hand grip strength 5/5 bilaterally. No joint deformity or effusion appreciated. Neurological: Cranial nerves 2-12 grossly intact. No meningismus. Symmetric brachioradialis reflexes.  IMAGING PET CT 02/28/16 (per radiologist): Hypermetabolic mediastinal adenopathy. No intrapulmonary specific findings of sarcoidosis or   EKG 03/26/16 (personally reviewed by me): Sinus rhythm. QTC 413 ms. No conduction delays or suggestion of ischemia  PATHOLOGY BAL RML (02/13/16):  Negative for malignancy EBUS FNA Subcarinal, Right Paratracheal, & Right Hilar (02/13/16):  Negative for malignancy  Mediastinal Lymph Node Biopsy (03/29/16): Noncaseating granulomatous lymphadenitis  MICROBIOLOGY Quantitative BAL (02/13/16):  Normal Respiratory Flora / Mycobacterium avium complex / viral culture negative QuantiFERON-TB (  03/08/16): Negative Right Bronchial Washings (03/29/16):  Bacteria negative / AFB negative / Anaerobic culture negative  Mediastinal Lymph Node Culture (03/29/16):  AFB negative / Fungus negative / Anaerobic culture negative   LABS 03/26/16 CBC: 7.3/14.3/41.2/218 BMP: 140/3.5/103/29/10/0.88/107/9.9  LFT: 4.6/7.4/0.6/66/34/27  02/02/16 ACE:  66    Assessment & Plan:  46 y.o. female with mediastinal and hilar lymphadenopathy. I reviewed her previous culture and pathology/cytology results from not only her prior bronchoscopy but also her mediastinal lymph node biopsy. Initially her cultures did show Mycobacterium avium complex from November by repeat cultures performed on bronchial washings were negative. The patient has no symptoms to suggest an underlying infectious process and I do believe that her cough is likely due to some airway involvement of her disease process. Certainly with her prior biopsy results sarcoidosis seems the most likely. However, with her  history of Raynaud's an underlying autoimmune disease may be at play as well. She is having intermittent and infrequent palpitations which when coupled with the probability of underlying sarcoidosis raises obvious concern. Her EKG reviewed by me and performed on 03/26/16 shows no obvious conduction abnormalities but I believe that further investigation is warranted. I had a lengthy discussion with the patient educating her on atypical mycobacterial infection as well as the multiple disease manifestations of sarcoidosis. I instructed the patient contact me if she had any further questions or concerns before her next appointment.  1. Mediastinal adenopathy: Suspect sarcoidosis. Checking serum ESR, CRP, ACE, ANA, centromere antibody screen, & Sjogren's panel. Continuing to hold on inhaled and oral corticosteroid therapy. 2. Raynaud's phenomenon: Checking serum ESR, CRP, SCL 70, anticentromere antibody screen. 3. Palpitations: Checking Holter monitor. May need to perform echocardiogram depending upon results. 4. Cough/dyspnea: Suspect secondary to underlying sarcoidosis. Patient desires to hold off on inhaled medication therapy at this time despite her resolution of symptoms on Dulera. Checking full pulmonary function testing before next appointment. 5. Health maintenance: Status post influenza vaccine October 2017. 6. Follow-up: Patient to return to clinic in 6 weeks or sooner if needed.  Sonia Baller Ashok Cordia, M.D. [facility] Pager:  [phone] After 3pm or if no response, call [phone] 5:08 PM 06/10/16

## 2016-06-11 ENCOUNTER — Other Ambulatory Visit: Payer: Self-pay | Admitting: Pulmonary Disease

## 2016-06-11 DIAGNOSIS — R0609 Other forms of dyspnea: Secondary | ICD-10-CM | POA: Diagnosis not present

## 2016-06-11 NOTE — Addendum Note (Signed)
Addended by: Tyson Dense on: 06/11/2016 09:08 AM   Modules accepted: Orders

## 2016-06-12 ENCOUNTER — Encounter: Payer: Self-pay | Admitting: Pulmonary Disease

## 2016-06-13 LAB — C-REACTIVE PROTEIN: CRP: 1.4 mg/L (ref 0.0–4.9)

## 2016-06-13 LAB — ANTI-SCLERODERMA ANTIBODY

## 2016-06-13 LAB — SJOGREN'S SYNDROME ANTIBODS(SSA + SSB)
ENA SSA (RO) Ab: <0.2 AI (ref 0.0–0.9)
ENA SSB (LA) Ab: <0.2 AI (ref 0.0–0.9)

## 2016-06-13 LAB — ANGIOTENSIN CONVERTING ENZYME: Angio Convert Enzyme: 72 U/L (ref 14–82)

## 2016-06-13 LAB — ANA: ANA TITER 1: NEGATIVE

## 2016-06-13 LAB — ANTI-CENTROMERE B ANTIBODIES

## 2016-06-13 LAB — SEDIMENTATION RATE: Sed Rate: 1 mm/hr (ref 0–32)

## 2016-06-13 NOTE — Telephone Encounter (Signed)
Pt seen for consult w/ JN on 3.19.18: Patient Instructions   Call or e-mail me if you have any new breathing problems or questions before your next appointment.  I will see you back in a few weeks to review your test results.  Remember to schedule an appointment with an ophthalmologist to be evaluated for eye involvement of your sarcoidosis.   TESTS ORDERED: 1. Full PFTs before next appointment 2. 28/30 Day Holter Monitor 3. Serum ESR, CRP, ACE, ANA, SCL-70, Centromere Antibody Screen, & Sjogren's Panel.    Order was placed for the Holter Monitor at the appt [facility] note states: 3/20-left vm for Sonya to call me back/spm  E-mail sent to patient informing her of this and that we will check on the progress for her PCC's please advise, thank you

## 2016-06-14 ENCOUNTER — Telehealth: Payer: Self-pay | Admitting: Pulmonary Disease

## 2016-06-14 NOTE — Telephone Encounter (Signed)
Sonya from [facility] on 06/11/16 she was going to call pt and schedule. Judeen Hammans called 06/13/16 and left another message for Davy Pique to call Judeen Hammans back if there is an issue scheduling.

## 2016-06-17 NOTE — Telephone Encounter (Signed)
Spoke with pt, checking the status of cardiac monitor.  I advised that per our chart notes heartcare should be contacting her to get this set up with her.  Gave her the number for heartcare.  Nothing further needed at this time, will close encounter.

## 2016-06-21 ENCOUNTER — Ambulatory Visit (INDEPENDENT_AMBULATORY_CARE_PROVIDER_SITE_OTHER): Payer: 59

## 2016-06-21 DIAGNOSIS — R002 Palpitations: Secondary | ICD-10-CM

## 2016-06-25 DIAGNOSIS — R002 Palpitations: Secondary | ICD-10-CM | POA: Diagnosis not present

## 2016-07-17 ENCOUNTER — Other Ambulatory Visit: Payer: Self-pay | Admitting: Family Medicine

## 2016-07-17 DIAGNOSIS — F339 Major depressive disorder, recurrent, unspecified: Secondary | ICD-10-CM

## 2016-07-18 ENCOUNTER — Ambulatory Visit (INDEPENDENT_AMBULATORY_CARE_PROVIDER_SITE_OTHER): Payer: 59 | Admitting: Pulmonary Disease

## 2016-07-18 DIAGNOSIS — R0609 Other forms of dyspnea: Secondary | ICD-10-CM

## 2016-07-18 LAB — PULMONARY FUNCTION TEST
DL/VA % pred: 89 %
DL/VA: 4.65 ml/min/mmHg/L
DLCO cor % pred: 83 %
DLCO cor: 24.29 ml/min/mmHg
DLCO unc % pred: 81 %
DLCO unc: 23.59 ml/min/mmHg
FEF 25-75 Post: 2.62 L/s
FEF 25-75 Pre: 1.76 L/s
FEF2575-%Change-Post: 48 %
FEF2575-%Pred-Post: 86 %
FEF2575-%Pred-Pre: 58 %
FEV1-%Change-Post: 9 %
FEV1-%Pred-Post: 88 %
FEV1-%Pred-Pre: 80 %
FEV1-Post: 2.81 L
FEV1-Pre: 2.57 L
FEV1FVC-%Change-Post: 8 %
FEV1FVC-%Pred-Pre: 89 %
FEV6-%Change-Post: 1 %
FEV6-%Pred-Post: 91 %
FEV6-%Pred-Pre: 89 %
FEV6-Post: 3.56 L
FEV6-Pre: 3.52 L
FEV6FVC-%Change-Post: 0 %
FEV6FVC-%Pred-Post: 101 %
FEV6FVC-%Pred-Pre: 101 %
FVC-%Change-Post: 1 %
FVC-%Pred-Post: 88 %
FVC-%Pred-Pre: 87 %
FVC-Post: 3.57 L
FVC-Pre: 3.52 L
Post FEV1/FVC ratio: 79 %
Post FEV6/FVC ratio: 100 %
Pre FEV1/FVC ratio: 73 %
Pre FEV6/FVC Ratio: 100 %
RV % pred: 107 %
RV: 2.07 L
TLC % pred: 92 %
TLC: 5.17 L

## 2016-07-18 NOTE — Progress Notes (Signed)
PFT done today. 

## 2016-07-19 ENCOUNTER — Ambulatory Visit: Payer: Self-pay | Admitting: Pulmonary Disease

## 2016-07-19 ENCOUNTER — Encounter: Payer: Self-pay | Admitting: Pulmonary Disease

## 2016-07-19 ENCOUNTER — Ambulatory Visit (INDEPENDENT_AMBULATORY_CARE_PROVIDER_SITE_OTHER)
Admission: RE | Admit: 2016-07-19 | Discharge: 2016-07-19 | Disposition: A | Discharge status: Home / Self Care | Payer: 59 | Source: Ambulatory Visit | Attending: Pulmonary Disease | Admitting: Pulmonary Disease

## 2016-07-19 DIAGNOSIS — R0602 Shortness of breath: Secondary | ICD-10-CM

## 2016-07-19 DIAGNOSIS — R079 Chest pain, unspecified: Secondary | ICD-10-CM | POA: Diagnosis not present

## 2016-07-19 NOTE — Telephone Encounter (Signed)
Please have the patient do a CXR PA/LAT today at our office. If she is still having symptoms she should seek immediate medical attention.

## 2016-07-19 NOTE — Telephone Encounter (Signed)
JN please advise on below email from patient:  Dr. Ashok Cordia,  I have two new concerning symptoms. Yesterday during my PFT breathing test I had pain in my left chest area when exhaling after they administered the inhaler. The rest of the afternoon I had a cough. After going to bed I awoke gasping for air. This has never happened before, but I literally could not get a breath - it felt like my airway was blocked. This only happened one time and I slept the rest of the night. Do you think this is something I need to see you about prior to our follow up scheduled for May 18? It is set for that time because the results from the heart monitor should be in by that time.

## 2016-07-19 NOTE — Telephone Encounter (Signed)
JN  Please Advise-please see pt email

## 2016-08-07 DIAGNOSIS — D869 Sarcoidosis, unspecified: Secondary | ICD-10-CM | POA: Diagnosis not present

## 2016-08-09 ENCOUNTER — Ambulatory Visit (INDEPENDENT_AMBULATORY_CARE_PROVIDER_SITE_OTHER): Payer: 59 | Admitting: Pulmonary Disease

## 2016-08-09 ENCOUNTER — Encounter: Payer: Self-pay | Admitting: Pulmonary Disease

## 2016-08-09 VITALS — BP 124/66 | HR 70 | Ht 68.0 in | Wt 161.6 lb

## 2016-08-09 DIAGNOSIS — R059 Cough, unspecified: Secondary | ICD-10-CM

## 2016-08-09 DIAGNOSIS — R590 Localized enlarged lymph nodes: Secondary | ICD-10-CM | POA: Diagnosis not present

## 2016-08-09 DIAGNOSIS — R05 Cough: Secondary | ICD-10-CM

## 2016-08-09 DIAGNOSIS — R002 Palpitations: Secondary | ICD-10-CM

## 2016-08-09 NOTE — Patient Instructions (Signed)
   Call me if you notice any new breathing problems or feel your cough is getting worse.  We will do a shortened breathing test at your next appointment.  We will set you up for an echocardiogram (ultrasound of your heart) and notify you of the result.  TESTS ORDERED: 1. Spirometry with bronchodilator challenge & DLCO at next appointment 2. Ensure Complete Echocardiogram with Contrast is ordered - Dx Palpitations

## 2016-08-09 NOTE — Progress Notes (Signed)
Patient had an OV today and was informed of her results. ECHO was placed. She verbalized understanding and did not have any questions. Nothing further is needed.

## 2016-08-09 NOTE — Progress Notes (Signed)
Subjective:    Patient ID: Barbara Frederick, female    DOB: 1970/06/13, 46 y.o.   MRN: [id number]  C.C.:  Follow-up for Cough, NTM Infection, Mediastinal/Hilar Lymphadenopathy, & Palpitations.   HPI Cough: Present for approximately one year. Desired to hold off on inhaled medications at last appointment. Cough is still sporadic. She does feel like exercise, air quality, and possibly high pollen counts. Cough is nonproductive.   NTM infection: Underwent bronchoscopy and lavage with biopsy in November 2017. Mycobacterium avium complex isolated. Mediastinoscopy on 03/29/16 was negative. No additional cultures were performed during mediastinoscopy. No hemoptysis.  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 Suspect underlying Sarcoidosis. No syncope or near syncope. No rashes. No eye swelling, pain, erythema, or blurry vision. Patient reports she has been evaluated by ophthalmology without any evidence of ocular sarcoid.  Palpitations: Patient had Holter monitor testing which was "normal". Patient did have intermittent PACs and atrial tachycardia. She reports her chest is "fluttering" from time to time.   Review of Systems No fever, chills, or new sweats. No recent chest pain, pressure, or tightness.   Allergies  Allergen Reactions  . Codeine Itching    itching    Current Outpatient Prescriptions on File Prior to Visit  Medication Sig Dispense Refill  . ALPRAZolam (XANAX) 0.5 MG tablet Take 1 tablet (0.5 mg total) by mouth 2 (two) times daily as needed for anxiety. Reported on 04/05/2015 (Patient taking differently: Take 0.5 mg by mouth daily as needed for anxiety. Reported on 04/05/2015) 60 tablet 5  . buPROPion (WELLBUTRIN XL) 150 MG 24 hr tablet TAKE 1 TABLET (150 MG TOTAL) BY MOUTH DAILY. 90 tablet 3  . cefUROXime (CEFTIN) 500 MG tablet   0  . doxycycline (ORACEA) 40 MG capsule TAKE ONE CAPSULE BY MOUTH WITH food AND  plenty OF IN FLUID  3  . loratadine (CLARITIN) 10 MG tablet Take 10 mg by mouth daily as needed for allergies.     . mometasone (NASONEX) 50 MCG/ACT nasal spray Place 2 sprays into the nose daily as needed (congestion).     . Multiple Vitamin (MULTI-VITAMINS) TABS Take 1 tablet by mouth daily.     . RHOFADE 1 % CREA APPLY TO THE AFFECTED AREA EVERY MORNING  3  . benzonatate (TESSALON) 200 MG capsule   0  . levofloxacin (LEVAQUIN) 500 MG tablet Take 1 tablet (500 mg total) by mouth daily. (Patient not taking: Reported on 08/09/2016) 7 tablet 0   No current facility-administered medications on file prior to visit.     Past Medical History:  Diagnosis Date  . Allergy   . Anxiety   . Arthritis    L knee- OA  . Complication of anesthesia    needed smaller ETT (anes unable to pass 8.0 or 8.5 ETT 02/13/16; 7.0 ETT used 08/24/07)    Past Surgical History:  Procedure Laterality Date  . ABDOMINAL HYSTERECTOMY  2010   Dr Amalia Hailey  . AUGMENTATION MAMMAPLASTY Bilateral 2001  . BREAST ENHANCEMENT SURGERY Bilateral [place] [employer] Implants  . CYST EXCISION    . ENDOBRONCHIAL ULTRASOUND N/A 02/13/2016   Procedure: ENDOBRONCHIAL ULTRASOUND;  Surgeon: Laverle Hobby, MD;  Location: [facility] ORS;  Service: Cardiopulmonary;  Laterality: N/A;  . MEDIASTINOSCOPY N/A 03/29/2016   Procedure: Video MEDIASTINOSCOPY WITH LYMPH NODE BIOPSY;  Surgeon: Grace Isaac, MD;  Location: [place];  Service: Thoracic;  Laterality: N/A;  . OVARY SURGERY     one  ovary and cervix at [facility]  . VAGINAL DELIVERY     twins   . VIDEO BRONCHOSCOPY N/A 03/29/2016   Procedure: VIDEO BRONCHOSCOPY;  Surgeon: Grace Isaac, MD;  Location: [facility] OR;  Service: Thoracic;  Laterality: N/A;    Family History  Problem Relation Age of Onset  . Cancer Paternal Grandmother 2       breast  . Hypertension Mother   . Raynaud syndrome Mother   . Emphysema Father   . Hypertension Brother   . Cancer Paternal Aunt 21        breast  . Colon cancer Maternal Grandfather   . Rectal cancer Maternal Grandfather   . Raynaud syndrome Maternal Grandfather   . Lung cancer Paternal Grandfather   . Emphysema Paternal Grandfather     Social History   Social History  . Marital status: Married    Spouse name: N/A  . Number of children: N/A  . Years of education: N/A   Social History Main Topics  . Smoking status: Passive Smoke Exposure - Never Smoker    Types: Cigarettes  . Smokeless tobacco: Never Used     Comment: Parents smoked around her.   . Alcohol use Yes     Comment: 3-5/week, wine on the weekend   . Drug use: No  . Sexual activity: Not Asked   Other Topics Concern  . None   Social History Narrative   [facility] (06/10/16):   Originally from [facility] [address]. Previously has also lived in [place], [place], [place]. Previously has traveled to [place] a few years ago. She has had prior exposure to elephants who appeared ill. Has also traveled to [place]. Does have indoor dogs & an indoor cat. Brief bird exposure. No mold or hot tub exposure. 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 Enjoys playing tennis & skiing. She also enjoys gardening.       Objective:   Physical Exam BP 124/66 (BP Location: Left Arm, Patient Position: Sitting, Cuff Size: Normal)   Pulse 70   Ht _0  (1.727 m)   Wt 161 lb 9.6 oz (73.3 kg)   SpO2 97%   BMI 24.57 kg/m   Gen.: Thin, Caucasian female. No distress. Comfortable. Integument:  Warm. Dry. No rash.  Cardiovascular: Regular rate. Regular rhythm. No edema. Pulmonary: Clear to auscultation bilaterally. Normal work of breathing on room air. Abdomen: Soft. Nondistended. Normal bowel sounds. HEENT: Moist mucous membranes. No scleral icterus. No oral ulcers. Neurological: Symmetric brachioradialis reflexes. No meningismus. Cranial nerves grossly intact.  PFT 07/18/16: FVC 3.52 L (87%) FEV1 2.57 L (80%) FEV1/FVC 0.73 FEF 25-75 1.76 L (58%)  negative bronchodilator response TLC 5.17 L (92%) RV 107% ERV 65% DLC corrected 83%  IMAGING PET CT 02/28/16 (per radiologist): Hypermetabolic mediastinal adenopathy. No intrapulmonary specific findings of sarcoidosis or   CARDIAC HOLTER MONITOR 06/21/16:  Normal sinus rhythm. Rare PACs. Moderate correlation between flutter and PAC's. "Normal study". EKG 03/26/16 (previously reviewed by me): Sinus rhythm. QTC 413 ms. No conduction delays or suggestion of ischemia  PATHOLOGY BAL RML (02/13/16):  Negative for malignancy EBUS FNA Subcarinal, Right Paratracheal, & Right Hilar (02/13/16):  Negative for malignancy  Mediastinal Lymph Node Biopsy (03/29/16): Noncaseating granulomatous lymphadenitis  MICROBIOLOGY Quantitative BAL (02/13/16):  Normal Respiratory Flora / Mycobacterium avium complex / viral culture negative QuantiFERON-TB (03/08/16): Negative Right Bronchial Washings (03/29/16):  Bacteria negative / AFB negative / Anaerobic culture negative  Mediastinal Lymph Node Culture (03/29/16):  AFB negative / Fungus negative / Anaerobic culture negative   LABS 06/11/16 ACE:  74 CRP: 1.4 ESR: 1 ANA: Negative Centromere Ab Screen:  <0.2 SCL-70:  <0.2 SSA:  <0.2 SSB:  <0.2  03/26/16 CBC: 7.3/14.3/41.2/218 BMP: 140/3.5/103/29/10/0.88/107/9.9  LFT: 4.6/7.4/0.6/66/34/27  02/02/16 ACE:  66    Assessment & Plan:  46 y.o. female with mediastinal and hilar lymphadenopathy with prior bronchoscopy and culture consistent with Mycobacterium avium complex. Patient does have some premature atrial contractions on her Holter monitor testing consistent with her history of palpitations. Patient's pulmonary function testing shows normal spirometry, lung volumes, and carbon monoxide diffusion capacity without a significant bronchodilator response. Despite previous culture patient has no symptoms that would suggest active nontuberculous mycobacterial infection. We did discuss the risk factors with untreated sarcoid  including ocular involvement, central nervous system involvement, and cardiac involvement. I instructed the patient to continue to monitor for new symptoms. I instructed the patient to notify me if she had any questions or concerns before her next appointment.  1. Mediastinal/hilar adenopathy: Suspect underlying sarcoidosis. Repeat spirometry with bronchodilator challenge and DLCO at next appointment. Holding on treatment at this time with minimal symptoms. Suspect cough is secondary to sarcoidosis. 2. Palpitations: Holding on cardiology referral. Also holding on medication treatment. Checking transthoracic echocardiogram. 3. NTM infection:  No signs or symptoms of infectious process. Plan to repeat culture for any evidence of active infection. 4. Health maintenance: Status post influenza vaccine October 2017. 5. Follow-up: Return to clinic in  6 months or sooner if needed.  Sonia Baller Ashok Cordia, M.D. [facility] Pager:  [phone] After 3pm or if no response, call [phone] 10:21 AM 08/09/16

## 2016-08-20 ENCOUNTER — Encounter: Payer: Self-pay | Admitting: Family Medicine

## 2016-08-20 ENCOUNTER — Encounter: Payer: Self-pay | Admitting: Pulmonary Disease

## 2016-08-22 ENCOUNTER — Other Ambulatory Visit: Payer: Self-pay | Admitting: Pulmonary Disease

## 2016-08-22 DIAGNOSIS — R002 Palpitations: Secondary | ICD-10-CM

## 2016-08-22 DIAGNOSIS — I471 Supraventricular tachycardia: Secondary | ICD-10-CM

## 2016-08-22 DIAGNOSIS — I491 Atrial premature depolarization: Secondary | ICD-10-CM

## 2016-08-29 ENCOUNTER — Other Ambulatory Visit: Payer: Self-pay

## 2016-08-29 ENCOUNTER — Ambulatory Visit (INDEPENDENT_AMBULATORY_CARE_PROVIDER_SITE_OTHER): Payer: 59

## 2016-08-29 DIAGNOSIS — I491 Atrial premature depolarization: Secondary | ICD-10-CM | POA: Diagnosis not present

## 2016-08-29 DIAGNOSIS — R002 Palpitations: Secondary | ICD-10-CM

## 2016-08-29 DIAGNOSIS — I471 Supraventricular tachycardia: Secondary | ICD-10-CM | POA: Diagnosis not present

## 2016-09-02 NOTE — Progress Notes (Signed)
Spoke with pt and notified of results per Dr. Nestor. Pt verbalized understanding and denied any questions. 

## 2016-10-02 ENCOUNTER — Ambulatory Visit (INDEPENDENT_AMBULATORY_CARE_PROVIDER_SITE_OTHER): Payer: 59 | Admitting: Family Medicine

## 2016-10-02 ENCOUNTER — Encounter: Payer: Self-pay | Admitting: Family Medicine

## 2016-10-02 VITALS — BP 112/74 | HR 68 | Temp 97.7°F | Resp 16 | Wt 160.0 lb

## 2016-10-02 DIAGNOSIS — N393 Stress incontinence (female) (male): Secondary | ICD-10-CM | POA: Diagnosis not present

## 2016-10-02 LAB — POCT URINALYSIS DIPSTICK
BILIRUBIN UA: NEGATIVE
Blood, UA: NEGATIVE
GLUCOSE UA: NEGATIVE
Ketones, UA: NEGATIVE
LEUKOCYTES UA: NEGATIVE
NITRITE UA: NEGATIVE
Protein, UA: NEGATIVE
Spec Grav, UA: <=1.005 — AB (ref 1.010–1.025)
Urobilinogen, UA: 0.2 E.U./dL
pH, UA: 6 (ref 5.0–8.0)

## 2016-10-02 NOTE — Progress Notes (Signed)
Patient: Barbara Frederick Female    DOB: 1970-06-04   46 y.o.   MRN: [id number] Visit Date: 10/02/2016  Today's Provider: Wilhemena Durie, MD   Chief Complaint  Patient presents with  . Urinary Incontinence   Subjective:    HPI   Urinary Incontinence This is a problem that has been occurring for several years per pt, but has been worsening over the last year. Occurs with coughing, bending down, stress (while in meetings, etc). This is especially aggravated by exercise. This problem causes embarrassment for the pt. She denies frequency, urgency, dysuria. She has tried Kegel exercises, without relief.  Allergies  Allergen Reactions  . Codeine Itching    itching     Current Outpatient Prescriptions:  .  ALPRAZolam (XANAX) 0.5 MG tablet, Take 1 tablet (0.5 mg total) by mouth 2 (two) times daily as needed for anxiety. Reported on 04/05/2015 (Patient taking differently: Take 0.5 mg by mouth daily as needed for anxiety. Reported on 04/05/2015), Disp: 60 tablet, Rfl: 5 .  Barberry-Oreg Grape-Goldenseal (BERBERINE COMPLEX PO), Take 500 mg by mouth 3 (three) times daily., Disp: , Rfl:  .  buPROPion (WELLBUTRIN XL) 150 MG 24 hr tablet, TAKE 1 TABLET (150 MG TOTAL) BY MOUTH DAILY., Disp: 90 tablet, Rfl: 3 .  Nutritional Supplements (JUICE PLUS FIBRE PO), Take by mouth., Disp: , Rfl:  .  loratadine (CLARITIN) 10 MG tablet, Take 10 mg by mouth daily as needed for allergies. , Disp: , Rfl:  .  mometasone (NASONEX) 50 MCG/ACT nasal spray, Place 2 sprays into the nose daily as needed (congestion). , Disp: , Rfl:  .  RHOFADE 1 % CREA, APPLY TO THE AFFECTED AREA EVERY MORNING, Disp: , Rfl: 3  Review of Systems  Constitutional: Negative for activity change, appetite change, chills, diaphoresis, fatigue, fever and unexpected weight change.  Respiratory: Negative for cough and shortness of breath.   Cardiovascular: Negative for chest pain, palpitations and leg swelling.  Genitourinary:  Negative for dysuria, frequency, hematuria and urgency.       Urinary incontinence is present    Social History  Substance Use Topics  . Smoking status: Passive Smoke Exposure - Never Smoker    Types: Cigarettes  . Smokeless tobacco: Never Used     Comment: Parents smoked around her.   . Alcohol use Yes     Comment: 3-5/week, wine on the weekend    Objective:   BP 112/74 (BP Location: Right Arm, Patient Position: Sitting, Cuff Size: Normal)   Pulse 68   Temp 97.7 F (36.5 C) (Oral)   Resp 16   Wt 160 lb (72.6 kg)   BMI 24.33 kg/m  Vitals:   10/02/16 1056  BP: 112/74  Pulse: 68  Resp: 16  Temp: 97.7 F (36.5 C)  TempSrc: Oral  Weight: 160 lb (72.6 kg)     Physical Exam  Constitutional: She appears well-developed and well-nourished.  HENT:  Head: Normocephalic.  Eyes: Conjunctivae are normal.  Neck: Normal range of motion.  Abdominal: Soft. There is no tenderness.  No CVA tenderness  Psychiatric: She has a normal mood and affect. Her behavior is normal.        Assessment & Plan:     1. Stress incontinence of urine Normal UA. Will refer to specialist for further evaluations, since she has tried Kegel exercises without relief. Results for orders placed or performed in visit on 10/02/16  POCT urinalysis dipstick  Result Value Ref Range  Color, UA Yellow    Clarity, UA Clear    Glucose, UA Negative    Bilirubin, UA Negative    Ketones, UA Negative    Spec Grav, UA <=1.005 (A) 1.010 - 1.025   Blood, UA Negative    pH, UA 6.0 5.0 - 8.0   Protein, UA Negative    Urobilinogen, UA 0.2 0.2 or 1.0 E.U./dL   Nitrite, UA Negative    Leukocytes, UA Negative Negative    - POCT urinalysis dipstick - Ambulatory referral to Urology  2.sarcoidosis     I have done the exam and reviewed the above chart and it is accurate to the best of my knowledge. Development worker, community has been used in this note in any air is in the dictation or transcription are  unintentional.  Wilhemena Durie, MD  [facility]

## 2016-10-10 DIAGNOSIS — M1712 Unilateral primary osteoarthritis, left knee: Secondary | ICD-10-CM | POA: Diagnosis not present

## 2016-10-17 ENCOUNTER — Encounter: Payer: Self-pay | Admitting: Pulmonary Disease

## 2016-10-17 ENCOUNTER — Telehealth: Payer: Self-pay | Admitting: Pulmonary Disease

## 2016-10-17 NOTE — Telephone Encounter (Signed)
Pt calling about her diagnosis - Sarcoid Pt states that her office building did an Air Quality test and it came back positive with Mold spores Pt is asking the correlation between her Dx and mold spores, if this is worrisome for her or if it possible caused her breathing issues or her Sarcoid to form. 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 Pt states that they relocated the office until this is repaired. Pt states that her breathing seems to be under control now, no increased issues.   Please advise Dr Ashok Cordia. Thanks.

## 2016-10-17 NOTE — Telephone Encounter (Signed)
Spoke with the pt and notified of recs per JN and she verbalized understanding  Nothing further needed

## 2016-10-17 NOTE — Telephone Encounter (Signed)
A fungal infection could have precipitated the Sarcoid or an infection that would appear as such, however, her cultures were negative for fungus from her procedure in January. Fungus itself has never been shown to be the only cause for Sarcoidosis. Thanks.

## 2016-11-11 ENCOUNTER — Ambulatory Visit: Payer: 59 | Admitting: Urology

## 2016-11-20 DIAGNOSIS — L700 Acne vulgaris: Secondary | ICD-10-CM | POA: Diagnosis not present

## 2016-11-20 DIAGNOSIS — L728 Other follicular cysts of the skin and subcutaneous tissue: Secondary | ICD-10-CM | POA: Diagnosis not present

## 2016-12-04 ENCOUNTER — Ambulatory Visit: Payer: 59

## 2016-12-10 ENCOUNTER — Ambulatory Visit: Payer: 59

## 2016-12-30 ENCOUNTER — Encounter: Payer: Self-pay | Admitting: Urology

## 2016-12-30 ENCOUNTER — Ambulatory Visit (INDEPENDENT_AMBULATORY_CARE_PROVIDER_SITE_OTHER): Payer: 59 | Admitting: Urology

## 2016-12-30 VITALS — BP 110/83 | HR 72 | Ht 68.0 in | Wt 150.0 lb

## 2016-12-30 DIAGNOSIS — N3946 Mixed incontinence: Secondary | ICD-10-CM | POA: Diagnosis not present

## 2016-12-30 DIAGNOSIS — N393 Stress incontinence (female) (male): Secondary | ICD-10-CM

## 2016-12-30 LAB — URINALYSIS, COMPLETE
BILIRUBIN UA: NEGATIVE
Glucose, UA: NEGATIVE
KETONES UA: NEGATIVE
Leukocytes, UA: NEGATIVE
NITRITE UA: NEGATIVE
Protein, UA: NEGATIVE
RBC UA: NEGATIVE
SPEC GRAV UA: 1.01 (ref 1.005–1.030)
UUROB: 0.2 mg/dL (ref 0.2–1.0)
pH, UA: 6 (ref 5.0–7.5)

## 2016-12-30 NOTE — Progress Notes (Signed)
12/30/2016 3:50 PM   Barbara Frederick 1970/07/15 [id number]  Referring provider: Jerrol Banana., MD [address] [place], [place] [address]  Chief Complaint  Patient presents with  . Urinary Incontinence     HPI: I was consulted to assess the patient is urinary incontinence worsening over a few years. 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 She leaks with coughing and sneezing and its worse with exercise. It is affecting her quality of life. I think she may have a little bit of urgency incontinence. Importantly she leaks more when she is nervous or stress. She denies bedwetting. She does not wear pads  She voids every 2 or 3 hours gets up once a night. She reports a good flow and feels empty  She has had a hysterectomy. Her bowel movements are normal. The presentation is not been medically treated  She denies a history of previous GU surgery kidney stones and she used to get bladder infections  Modifying factors: There are no other modifying factors  Associated signs and symptoms: There are no other associated signs and symptoms Aggravating and relieving factors: There are no other aggravating or relieving factors Severity: Moderate Duration: Persistent     PMH: Past Medical History:  Diagnosis Date  . Allergy   . Anxiety   . Arthritis    L knee- OA  . Complication of anesthesia    needed smaller ETT (anes unable to pass 8.0 or 8.5 ETT 02/13/16; 7.0 ETT used 08/24/07)    Surgical History: Past Surgical History:  Procedure Laterality Date  . ABDOMINAL HYSTERECTOMY  2010   Dr Amalia Hailey  . AUGMENTATION MAMMAPLASTY Bilateral 2001  . BREAST ENHANCEMENT SURGERY Bilateral [place] Saline Implants  . CYST EXCISION    . ENDOBRONCHIAL ULTRASOUND N/A 02/13/2016   Procedure: ENDOBRONCHIAL ULTRASOUND;  Surgeon: Laverle Hobby, MD;  Location: [facility] OR[facility];  Service: Cardiopulmonary;  Laterality: N/A;  . MEDIASTINOSCOPY N/A 03/29/2016   Procedure: Video MEDIASTINOSCOPY WITH LYMPH NODE BIOPSY;  Surgeon: Grace Isaac, MD;  Location: [place];  Service: Thoracic;  Laterality: N/A;  . OVARY SURGERY     one ovary [facility] cervix at [facility]  . VAGINAL DELIVERY     twins   . VIDEO BRONCHOSCOPY N/A 03/29/2016   Procedure: VIDEO BRONCHOSCOPY;  Surgeon: Grace Isaac, MD;  Location: [facility] OR;  Service: Thoracic;  Laterality: N/A;    Home Medications:  Allergies as of 12/30/2016      Reactions   Codeine Itching   itching      Medication List       Accurate as of 12/30/16  3:50 PM. Always use your most recent med list.          ALPRAZolam 0.5 MG tablet Commonly known as:  XANAX Take 1 tablet (0.5 mg total) by mouth 2 (two) times daily as needed for anxiety. Reported on 04/05/2015   BERBERINE COMPLEX PO Take 500 mg by mouth 3 (three) times daily.   buPROPion 150 MG 24 hr tablet Commonly known as:  WELLBUTRIN XL TAKE 1 TABLET (150 MG TOTAL) BY MOUTH DAILY.   CLARITIN 10 MG tablet Generic drug:  loratadine Take 10 mg by mouth daily as needed for allergies.   JUICE PLUS FIBRE PO Take by mouth.   mometasone 50 MCG/ACT nasal spray Commonly known as:  NASONEX Place 2 sprays into the nose daily as needed (congestion).       Allergies:  Allergies  Allergen Reactions  . Codeine Itching    itching    Family History: Family History  Problem Relation Age of Onset  . Cancer Paternal Grandmother 74       breast  . Hypertension Mother   . Raynaud syndrome Mother   . Emphysema Father   . Hypertension Brother   . Cancer Paternal Aunt 66       breast  . Colon cancer Maternal Grandfather   . Rectal cancer Maternal Grandfather   . Raynaud syndrome Maternal Grandfather   . Lung cancer Paternal Grandfather   . Emphysema Paternal Grandfather     Social History:  reports that she is a non-smoker but has been exposed to tobacco smoke. She has never used smokeless tobacco. She reports that she drinks alcohol. She reports  that she does not use drugs.  ROS: UROLOGY Frequent Urination?: No Hard to postpone urination?: No Burning/pain with urination?: No Get up at night to urinate?: No Leakage of urine?: Yes Urine stream starts and stops?: No Trouble starting stream?: No Do you have to strain to urinate?: No Blood in urine?: No Urinary tract infection?: No Sexually transmitted disease?: No Injury to kidneys or bladder?: No Painful intercourse?: No Weak stream?: No Currently pregnant?: No Vaginal bleeding?: No Last menstrual period?: n  Gastrointestinal Nausea?: No Vomiting?: No Indigestion/heartburn?: No Diarrhea?: No Constipation?: No  Constitutional Fever: No Night sweats?: No Weight loss?: No Fatigue?: No  Skin Skin rash/lesions?: No Itching?: No  Eyes Blurred vision?: No Double vision?: No  Ears/Nose/Throat Sore throat?: No Sinus problems?: No  Hematologic/Lymphatic Swollen glands?: No Easy bruising?: No  Cardiovascular Leg swelling?: No Chest pain?: No  Respiratory Cough?: No Shortness of breath?: No  Endocrine Excessive thirst?: No  Musculoskeletal Back pain?: No Joint pain?: No  Neurological Headaches?: No Dizziness?: No  Psychologic Depression?: No Anxiety?: No  Physical Exam: BP 110/83 (BP Location: Right Arm, Patient Position: Sitting, Cuff Size: Normal)   Pulse 72   Ht 5\' 8"  (1.727 m)   Wt 68 kg (150 lb)   BMI 22.81 kg/m   Constitutional:  Alert and oriented, No acute distress. HEENT: [place] AT, moist mucus membranes.  Trachea midline, no masses. Cardiovascular: No clubbing, cyanosis, or edema. Respiratory: Normal respiratory effort, no increased work of breathing. GI: Abdomen is soft, nontender, nondistended, no abdominal masses GU: No CVA tenderness. No abdominal tenderness Skin: No rashes, bruises or suspicious lesions. Lymph: No cervical or inguinal adenopathy. Neurologic: Grossly intact, no focal deficits, moving all 4  extremities. Psychiatric: Normal mood and affect.  Laboratory Data: Lab Results  Component Value Date   WBC 7.3 03/26/2016   HGB 14.3 03/26/2016   HCT 41.2 03/26/2016   MCV 89.6 03/26/2016   PLT 218 03/26/2016    Lab Results  Component Value Date   CREATININE 0.88 03/26/2016    No results found for: PSA  No results found for: TESTOSTERONE  No results found for: HGBA1C  Urinalysis    Component Value Date/Time   BILIRUBINUR Negative 10/02/2016 1109   PROTEINUR Negative 10/02/2016 1109   UROBILINOGEN 0.2 10/02/2016 1109   NITRITE Negative 10/02/2016 1109   LEUKOCYTESUR Negative 10/02/2016 1109    Pertinent Imaging: none  Assessment & Plan:  The patient has mixed incontinence but primarily stress incontinence. She may have an overactive bladder since her incontinence is worse when she is nervous. She has mild frequency and nocturia.  The patient and I had a long conversation and she does not want to have  a pelvic examination.Marland Kitchen She's had issues with her gynecologist on the same. We talked about the role of urodynamics and a pelvic examination. We talked about her mild incontinence. She is not that [facility] but she would like [facility] to call her at least be assessed. Reasons for choosing [facility] were discussed.  1. Stress incontinence, female 2. Urinary frequency - Urinalysis, Complete - Bladder Scan (Post Void Residual) in office   No Follow-up on file.  Reece Packer, MD  [facility] [address] [place], [place] [address] [phone]

## 2017-01-14 ENCOUNTER — Encounter: Payer: Self-pay | Admitting: Family Medicine

## 2017-01-15 ENCOUNTER — Telehealth: Payer: Self-pay

## 2017-01-15 DIAGNOSIS — F339 Major depressive disorder, recurrent, unspecified: Secondary | ICD-10-CM

## 2017-01-15 MED ORDER — BUPROPION HCL ER (XL) 300 MG PO TB24: 300.0000 mg | ORAL_TABLET | Freq: Every day | ORAL | 3 refills | Status: DC

## 2017-01-15 NOTE — Telephone Encounter (Signed)
Patient advised through Marily Lente, RMA

## 2017-01-15 NOTE — Telephone Encounter (Signed)
Increase to 300 daily.

## 2017-01-15 NOTE — Telephone Encounter (Signed)
Dr. Rosanna Randy,    I have about 7 days left of my Wellbutrin, and I wondered if we could go up a little on the dose before I have to refill it. This is the time of year I seem to get a little depressed, and the medication has been working well with no side effects. I am also using my sun light, and that helps too. I think I am on the lowest dose and thought I would ask before I refill it if maybe you could increase the dosage a little? Thank you! If you think I need to come in just let me know.     Barbara Frederick   came through my [employer], RMA

## 2017-01-27 DIAGNOSIS — R102 Pelvic and perineal pain: Secondary | ICD-10-CM | POA: Diagnosis not present

## 2017-01-27 DIAGNOSIS — N393 Stress incontinence (female) (male): Secondary | ICD-10-CM | POA: Diagnosis not present

## 2017-01-27 DIAGNOSIS — M6281 Muscle weakness (generalized): Secondary | ICD-10-CM | POA: Diagnosis not present

## 2017-01-27 DIAGNOSIS — M62838 Other muscle spasm: Secondary | ICD-10-CM | POA: Diagnosis not present

## 2017-02-03 DIAGNOSIS — S63501A Unspecified sprain of right wrist, initial encounter: Secondary | ICD-10-CM | POA: Diagnosis not present

## 2017-02-03 DIAGNOSIS — M13831 Other specified arthritis, right wrist: Secondary | ICD-10-CM | POA: Diagnosis not present

## 2017-02-03 DIAGNOSIS — N941 Unspecified dyspareunia: Secondary | ICD-10-CM | POA: Diagnosis not present

## 2017-02-03 DIAGNOSIS — M62838 Other muscle spasm: Secondary | ICD-10-CM | POA: Diagnosis not present

## 2017-02-03 DIAGNOSIS — M6281 Muscle weakness (generalized): Secondary | ICD-10-CM | POA: Diagnosis not present

## 2017-02-03 DIAGNOSIS — N393 Stress incontinence (female) (male): Secondary | ICD-10-CM | POA: Diagnosis not present

## 2017-02-03 DIAGNOSIS — R102 Pelvic and perineal pain: Secondary | ICD-10-CM | POA: Diagnosis not present

## 2017-02-05 ENCOUNTER — Ambulatory Visit: Payer: Self-pay | Admitting: Pulmonary Disease

## 2017-02-20 DIAGNOSIS — S63521A Sprain of radiocarpal joint of right wrist, initial encounter: Secondary | ICD-10-CM | POA: Diagnosis not present

## 2017-02-21 ENCOUNTER — Ambulatory Visit (INDEPENDENT_AMBULATORY_CARE_PROVIDER_SITE_OTHER)
Admission: RE | Admit: 2017-02-21 | Discharge: 2017-02-21 | Disposition: A | Discharge status: Home / Self Care | Payer: 59 | Source: Ambulatory Visit | Attending: Pulmonary Disease | Admitting: Pulmonary Disease

## 2017-02-21 ENCOUNTER — Ambulatory Visit (INDEPENDENT_AMBULATORY_CARE_PROVIDER_SITE_OTHER): Payer: 59 | Admitting: Pulmonary Disease

## 2017-02-21 ENCOUNTER — Ambulatory Visit: Payer: 59 | Admitting: Pulmonary Disease

## 2017-02-21 VITALS — BP 126/74 | HR 84 | Ht 67.5 in | Wt 148.0 lb

## 2017-02-21 DIAGNOSIS — G44019 Episodic cluster headache, not intractable: Secondary | ICD-10-CM

## 2017-02-21 DIAGNOSIS — R634 Abnormal weight loss: Secondary | ICD-10-CM

## 2017-02-21 DIAGNOSIS — G44329 Chronic post-traumatic headache, not intractable: Secondary | ICD-10-CM

## 2017-02-21 DIAGNOSIS — R51 Headache: Secondary | ICD-10-CM | POA: Diagnosis not present

## 2017-02-21 DIAGNOSIS — R590 Localized enlarged lymph nodes: Secondary | ICD-10-CM

## 2017-02-21 DIAGNOSIS — R0602 Shortness of breath: Secondary | ICD-10-CM

## 2017-02-21 DIAGNOSIS — R059 Cough, unspecified: Secondary | ICD-10-CM

## 2017-02-21 DIAGNOSIS — R918 Other nonspecific abnormal finding of lung field: Secondary | ICD-10-CM | POA: Diagnosis not present

## 2017-02-21 DIAGNOSIS — R519 Headache, unspecified: Secondary | ICD-10-CM | POA: Insufficient documentation

## 2017-02-21 DIAGNOSIS — R05 Cough: Secondary | ICD-10-CM

## 2017-02-21 DIAGNOSIS — R002 Palpitations: Secondary | ICD-10-CM

## 2017-02-21 LAB — PULMONARY FUNCTION TEST
DL/VA % pred: 82 %
DL/VA: 4.26 ml/min/mmHg/L
DLCO COR % PRED: 74 %
DLCO COR: 21.5 ml/min/mmHg
DLCO UNC % PRED: 74 %
DLCO unc: 21.63 ml/min/mmHg
FEF 25-75 Pre: 2.03 L/sec
FEF2575-%PRED-PRE: 67 %
FEV1-%Pred-Pre: 77 %
FEV1-Pre: 2.46 L
FEV1FVC-%PRED-PRE: 94 %
FEV6-%PRED-PRE: 83 %
FEV6-PRE: 3.24 L
FEV6FVC-%Pred-Pre: 101 %
FVC-%PRED-PRE: 81 %
FVC-Pre: 3.26 L
PRE FEV6/FVC RATIO: 100 %
Pre FEV1/FVC ratio: 76 %

## 2017-02-21 NOTE — Patient Instructions (Addendum)
   Call my office if you have any new breathing problems or questions before your next appointment.  We will plan on setting you up to see Dr. Vaughan Browner or Dr. Lake Bells at your follow-up visit.  Make sure you talk with your primary care doctor about your weight loss to explore other possibilities.   TESTS ORDERED: 1. CT HEAD W/O ASAP 2. CT Chest W/O ASAP

## 2017-02-21 NOTE — Progress Notes (Signed)
Subjective:    Patient ID: Barbara Frederick, female    DOB: 1970-08-18, 46 y.o.   MRN: [id number]  C.C.:  Follow-up for Chronic Cough, NTM Infection, Mediastinal/Hilar Lymphadenopathy, & Palpitations.   HPI Chronic cough: Present for approximately one year. Secondary to nontuberculous mycobacterial infection versus sarcoidosis. Her cough has improved significantly. She reports she did have mold that was discovered at her prior residence. She reports coughing now with "extreme" exercise. No wheezing.   NTM infection: Previously underwent bronchoscopy with lavage and biopsy in November 2017. Previously isolated Mycobacterium avium complex. Also underwent mediastinoscopy on 03/29/16 without additional culture.  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 No syncope or near syncope. No eye swelling or erythema.   Palpitations: Normal Holter monitor testing with intermittent PACs and atrial tachycardia.  She reports decreased frequency lately as well.   Review of Systems No fever or chills. She has had night sweats and questionable hot flashes. No new rashes or bruising. She reports no recent joint pain, swelling, or stiffness as this too has improved. She reports she did have a "burst and explosion" of pain with a headache that woke her out of sleep and lasted for 5 minutes. She has also noticed some questionable memory problems. She has lost weight without trying recently as well.   Allergies  Allergen Reactions  . Codeine Itching    itching    Current Outpatient Medications on File Prior to Visit  Medication Sig Dispense Refill  . ALPRAZolam (XANAX) 0.5 MG tablet Take 1 tablet (0.5 mg total) by mouth 2 (two) times daily as needed for anxiety. Reported on 04/05/2015 (Patient taking differently: Take 0.5 mg by mouth daily as needed for anxiety. Reported on 04/05/2015) 60 tablet 5  .  Barberry-Oreg Grape-Goldenseal (BERBERINE COMPLEX PO) Take 500 mg by mouth 3 (three) times daily.    Marland Kitchen buPROPion (WELLBUTRIN XL) 300 MG 24 hr tablet Take 1 tablet (300 mg total) by mouth daily. 90 tablet 3  . loratadine (CLARITIN) 10 MG tablet Take 10 mg by mouth daily as needed for allergies.     . mometasone (NASONEX) 50 MCG/ACT nasal spray Place 2 sprays into the nose daily as needed (congestion).     . Nutritional Supplements (JUICE PLUS FIBRE PO) Take by mouth.     No current facility-administered medications on file prior to visit.     Past Medical History:  Diagnosis Date  . Allergy   . Anxiety   . Arthritis    L knee- OA  . Complication of anesthesia    needed smaller ETT (anes unable to pass 8.0 or 8.5 ETT 02/13/16; 7.0 ETT used 08/24/07)    Past Surgical History:  Procedure Laterality Date  . ABDOMINAL HYSTERECTOMY  2010   Dr Amalia Hailey  . AUGMENTATION MAMMAPLASTY Bilateral 2001  . BREAST ENHANCEMENT SURGERY Bilateral [place] [facility]  . CYST EXCISION    . ENDOBRONCHIAL ULTRASOUND N/A 02/13/2016   Procedure: ENDOBRONCHIAL ULTRASOUND;  Surgeon: Laverle Hobby, MD;  Location: [facility];  Service: Cardiopulmonary;  Laterality: N/A;  . MEDIASTINOSCOPY N/A 03/29/2016   Procedure: Video MEDIASTINOSCOPY WITH LYMPH NODE BIOPSY;  Surgeon: Grace Isaac, MD;  Location: [place];  Service: Thoracic;  Laterality: N/A;  . OVARY SURGERY     one ovary and cervix at [facility]  . VAGINAL DELIVERY     twins   . VIDEO BRONCHOSCOPY N/A 03/29/2016   Procedure: VIDEO BRONCHOSCOPY;  Surgeon: Grace Isaac, MD;  Location: [facility] OR;  Service: Thoracic;  Laterality: N/A;    Family History  Problem Relation Age of Onset  . Cancer Paternal Grandmother 23       breast  . Hypertension Mother   . Raynaud syndrome Mother   . Emphysema Father   . Hypertension Brother   . Cancer Paternal Aunt 4       breast  . Colon cancer Maternal Grandfather   . Rectal cancer Maternal  Grandfather   . Raynaud syndrome Maternal Grandfather   . Lung cancer Paternal Grandfather   . Emphysema Paternal Grandfather     Social History   Socioeconomic History  . Marital status: Married    Spouse name: Not on file  . Number of children: Not on file  . Years of education: Not on file  . Highest education level: Not on file  Social Needs  . Financial resource strain: Not on file  . Food insecurity - worry: Not on file  . Food insecurity - inability: Not on file  . Transportation needs - medical: Not on file  . Transportation needs - non-medical: Not on file  Occupational History  . Not on file  Tobacco Use  . Smoking status: Passive Smoke Exposure - Never Smoker  . Smokeless tobacco: Never Used  . Tobacco comment: Parents smoked around her.   Substance and Sexual Activity  . Alcohol use: Yes    Comment: 3-5/week, wine on the weekend   . Drug use: No  . Sexual activity: Not on file  Other Topics Concern  . Not on file  Social History Narrative   [facility] (06/10/16):   Originally from [facility]. Previously has also lived in [place], [place], [place]. Previously has traveled to [place] a few years ago. She has had prior exposure to elephants who appeared ill. Has also traveled to [place]. Does have indoor dogs & an indoor cat. Brief bird exposure. No mold or hot tub exposure. 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 Enjoys playing tennis & skiing. She also enjoys gardening.       Objective:   Physical Exam BP 126/74 (BP Location: Left Arm, Cuff Size: Normal)   Pulse 84   Ht 5' 7.5" (1.715 m)   Wt 148 lb (67.1 kg)   SpO2 97%   BMI 22.84 kg/m   General:  Awake. No distress. Comfortable.  Integument:  No rash. Warm. Dry. Extremities:  No cyanosis or clubbing.  HEENT:  No nasal turbinate swelling. No scleral icterus. Moist mucous membranes Cardiovascular:  Regular rate. No edema. Regular rhythm.  Pulmonary:  Clear bilaterally  to auscultation. Normal work of breathing on room air. Abdomen: Soft. Normal bowel sounds. Nondistended.  Musculoskeletal:  Normal bulk and tone. Hand grip strength 5/5 bilaterally. No joint deformity or effusion appreciated. Neurological:  Cranial nerves 2-12 grossly in tact. No meningismus. Moving all 4 extremities equally.   PFT 02/21/17: FVC 3.26 L (81%) FEV1 2.46 L (77%) FEV1/FVC 0.76 FEF 25-75 2.03 L (67%)  DLCO corrected 74% 07/18/16: FVC 3.52 L (87%) FEV1 2.57 L (80%) FEV1/FVC 0.73 FEF 25-75 1.76 L (58%) negative bronchodilator response TLC 5.17 L (92%) RV 107% ERV 65% DLCO corrected 83%  IMAGING PET CT 02/28/16 (per radiologist): Hypermetabolic mediastinal adenopathy. No intrapulmonary specific findings of sarcoidosis.   CARDIAC TTE (08/29/16):  LV normal in size with EF 55-60%. No regional wall motion abnormalities. Normal LV diastolic function. LA & RA normal in size. RV normal in size and function. No aortic stenosis or regurgitation. Aortic root normal in size. No mitral stenosis or regurgitation. No pulmonic stenosis. No tricuspid regurgitation. No pericardial effusion. HOLTER MONITOR 06/21/16:  Normal sinus rhythm. Rare PACs. Moderate correlation between flutter and PAC's. "Normal study". EKG 03/26/16 (previously reviewed by me): Sinus rhythm. QTC 413 ms. No conduction delays or suggestion of ischemia  PATHOLOGY BAL RML (02/13/16):  Negative for malignancy EBUS FNA Subcarinal, Right Paratracheal, & Right Hilar (02/13/16):  Negative for malignancy  Mediastinal Lymph Node Biopsy (03/29/16): Noncaseating granulomatous lymphadenitis  MICROBIOLOGY Quantitative BAL (02/13/16):  Normal Respiratory Flora / Mycobacterium avium complex / viral culture negative QuantiFERON-TB (03/08/16): Negative Right Bronchial Washings (03/29/16):  Bacteria negative / AFB negative / Anaerobic  culture negative  Mediastinal Lymph Node Culture (03/29/16):  AFB negative / Fungus negative / Anaerobic culture negative   LABS 06/11/16 ACE:  74 CRP: 1.4 ESR: 1 ANA: Negative Centromere Ab Screen:  <0.2 SCL-70:  <0.2 SSA:  <0.2 SSB:  <0.2  03/26/16 CBC: 7.3/14.3/41.2/218 BMP: 140/3.5/103/29/10/0.88/107/9.9  LFT: 4.6/7.4/0.6/66/34/27  02/02/16 ACE:  66    Assessment & Plan:  46 y.o. female with mediastinal and hilar adenopathy. Given patient's symptom improvement after mold was discovered at her previous residence and she moved I do question whether or not this could've been a contributing factor. However, with her weight loss I am concerned there is another process progressing. She has no symptoms that would suggest active nontuberculous mycobacterial infection but repeat chest imaging is certainly reasonable. Additionally, she experienced a sudden onset headache that woke her from her sleep which needs to be assessed further with brain imaging. I instructed the patient to contact my office if she had any new breathing problems or questions before her next appointment.  1. Chronic cough: Improving. Question whether or not this may have been due to mold exposure. 2. Mediastinal/hilar adenopathy: Repeating CT chest without contrast ASAP given weight loss area 3. Palpitations: Improving. Deferring further testing. 4. NTM infection: No further symptoms to suggest infection. Repeating CT chest without contrast. 5. Episodic headache: Checking CT head without contrast ASAP. Consider referral to neurology pending result. 6. Health maintenance: Reports she did have flu vaccine. 7. Follow-up: Return to clinic in  4 weeks or sooner if needed.  Sonia Baller Ashok Cordia, M.D. [facility] Pager:  [phone] After 3pm or if no response, call [phone] 12:12 PM 02/21/17

## 2017-02-21 NOTE — Progress Notes (Signed)
PFT done today. 

## 2017-02-22 ENCOUNTER — Encounter: Payer: Self-pay | Admitting: Pulmonary Disease

## 2017-02-24 ENCOUNTER — Telehealth: Payer: Self-pay | Admitting: Pulmonary Disease

## 2017-02-24 NOTE — Telephone Encounter (Signed)
Responded to patient's results request via [facility]. Will close this encounter.

## 2017-03-13 ENCOUNTER — Ambulatory Visit: Payer: 59 | Admitting: Pulmonary Disease

## 2017-03-13 ENCOUNTER — Encounter: Payer: Self-pay | Admitting: Pulmonary Disease

## 2017-03-13 VITALS — BP 126/76 | HR 72 | Ht 67.5 in | Wt 147.0 lb

## 2017-03-13 DIAGNOSIS — R590 Localized enlarged lymph nodes: Secondary | ICD-10-CM

## 2017-03-13 DIAGNOSIS — A310 Pulmonary mycobacterial infection: Secondary | ICD-10-CM | POA: Diagnosis not present

## 2017-03-13 NOTE — Progress Notes (Addendum)
Subjective:   PATIENT ID: Barbara Frederick GENDER: female DOB: [birth year], MRN: [id number]  Synopsis: Former patient of Dr. Ashok Cordia who has chronic cough and MAI colonization in the lung with associated lymphadenopathy.  HPI  Chief Complaint  Patient presents with  . Follow-up    4wk rov- pt states breathing is baseline. pt reports of sob with exercise.   Barbara Frederick comes back to clinic today to follow-up to discuss her recent lung function testing and CT scan.  In 2017 she had an abnormal chest x-ray which was followed up with a CT scan showing some lymphadenopathy.  She had a bronchoscopy in November 2017 with endobronchial ultrasounds.  By report she had a difficult intubation.  Endobronchial ultrasound-guided fine-needle aspiration was performed from the right hilar lymph node as well as from the right paratracheal lymph node.  A BAL was performed in the right middle lobe as well.  Cultures from the BAL grew out MAI.  In early January 2018 she underwent a mediastinoscopy for lymph node dissection and resection.  This procedure was performed by Dr. Servando Snare.  His operation report shows that there is extensive mediastinal adenopathy noted through the mediastinal scope.  This was sent for frozen section which was consistent with inflammatory disease.  They remove more lymph tissue for permanent histology and sent for cultures.  Bronchoscopy and BAL were performed during that procedure.  Afterwards she followed up with thoracic surgery and [place] and was told that she should follow-up with infectious disease for noncaseating granulomas.  It was felt that she may have a viral infection.  She saw Dr. Ashok Cordia in March 2018 and his initial impression was that she may not have MAI and may in fact have sarcoidosis.  It sounds as if this was based on the fact that the initial culture showed MAI right middle lobe BAL but a repeat bronchoscopy wash did not show MAI.  She followed up in May 2018.  At  that point he said he suspected sarcoidosis but also documented MAI infection in his assessment.  He saw her again in November 2018 at which time she had some cough she said proving after mold exposure.  We repeated a CT scan of her chest however because lung function testing showed a slight decline.  He also sent her for a head CT and consider referral for neurology because of headache.  She comes to me today saying that she is concerned about weight loss.  She says she has lost 12 pounds since the beginning of October.  She does have night sweats.  She denies any cough or shortness of breath.  She does not produce any mucus.  She is here to follow-up the results of the CT scan.  She states that she started taking Wellbutrin around the beginning of October at a higher dose for seasonal affective disorder.    Past Medical History:  Diagnosis Date  . Allergy   . Anxiety   . Arthritis    L knee- OA  . Complication of anesthesia    needed smaller ETT (anes unable to pass 8.0 or 8.5 ETT 02/13/16; 7.0 ETT used 08/24/07)     Family History  Problem Relation Age of Onset  . Cancer Paternal Grandmother 68       breast  . Hypertension Mother   . Raynaud syndrome Mother   . Emphysema Father   . Hypertension Brother   . Cancer Paternal Aunt 31  breast  . Colon cancer Maternal Grandfather   . Rectal cancer Maternal Grandfather   . Raynaud syndrome Maternal Grandfather   . Lung cancer Paternal Grandfather   . Emphysema Paternal Grandfather      Social History   Socioeconomic History  . Marital status: Married    Spouse name: Not on file  . Number of children: Not on file  . Years of education: Not on file  . Highest education level: Not on file  Social Needs  . Financial resource strain: Not on file  . Food insecurity - worry: Not on file  . Food insecurity - inability: Not on file  . Transportation needs - medical: Not on file  . Transportation needs - non-medical: Not on file    Occupational History  . Not on file  Tobacco Use  . Smoking status: Passive Smoke Exposure - Never Smoker  . Smokeless tobacco: Never Used  . Tobacco comment: Parents smoked around her.   Substance and Sexual Activity  . Alcohol use: Yes    Comment: 3-5/week, wine on the weekend   . Drug use: No  . Sexual activity: Not on file  Other Topics Concern  . Not on file  Social History Narrative   [facility] (06/10/16):   Originally from [facility]. Previously has also lived in [place], [place], [place]. Previously has traveled to [place] a few years ago. She has had prior exposure to elephants who appeared ill. Has also traveled to [place]. Does have indoor dogs & an indoor cat. Brief bird exposure. No mold or hot tub exposure. 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 Enjoys playing tennis & skiing. She also enjoys gardening.      Allergies  Allergen Reactions  . Codeine Itching    itching     Outpatient Medications Prior to Visit  Medication Sig Dispense Refill  . ALPRAZolam (XANAX) 0.5 MG tablet Take 1 tablet (0.5 mg total) by mouth 2 (two) times daily as needed for anxiety. Reported on 04/05/2015 (Patient taking differently: Take 0.5 mg by mouth daily as needed for anxiety. Reported on 04/05/2015) 60 tablet 5  . Barberry-Oreg Grape-Goldenseal (BERBERINE COMPLEX PO) Take 500 mg by mouth 3 (three) times daily.    Marland Kitchen buPROPion (WELLBUTRIN XL) 300 MG 24 hr tablet Take 1 tablet (300 mg total) by mouth daily. 90 tablet 3  . loratadine (CLARITIN) 10 MG tablet Take 10 mg by mouth daily as needed for allergies.     . mometasone (NASONEX) 50 MCG/ACT nasal spray Place 2 sprays into the nose daily as needed (congestion).     . Nutritional Supplements (JUICE PLUS FIBRE PO) Take by mouth.     No facility-administered medications prior to visit.     Review of Systems  Constitutional: Positive for weight loss. Negative for diaphoresis.  HENT: Negative for  nosebleeds and sinus pain.   Respiratory: Negative for cough, sputum production, shortness of breath and stridor.   Cardiovascular: Negative for palpitations, claudication and leg swelling.  Neurological: Negative for weakness.      Objective:  Physical Exam   Vitals:   03/13/17 1041  BP: 126/76  Pulse: 72  SpO2: 98%  Weight: 147 lb (66.7 kg)  Height: 5' 7.5" (1.715 m)   Gen: well appearing, no acute distress HENT: NCAT, OP clear, TM's clear Eyes: PERRL, EOMi PULM: CTA B CV: RRR, no mgr, no JVD GI: BS+, soft, nontender, no hsm Derm: no rash or skin  breakdown MSK: normal bulk and tone Neuro: A&Ox4, CN II-XII intact, strength 5/5 in all 4 extremities Psyche: normal mood and affect    CBC    Component Value Date/Time   WBC 7.3 03/26/2016 1555   RBC 4.60 03/26/2016 1555   HGB 14.3 03/26/2016 1555   HGB 14.0 02/01/2016 0734   HCT 41.2 03/26/2016 1555   HCT 40.0 02/01/2016 0734   PLT 218 03/26/2016 1555   PLT 253 02/01/2016 0734   MCV 89.6 03/26/2016 1555   MCV 89 02/01/2016 0734   MCH 31.1 03/26/2016 1555   MCHC 34.7 03/26/2016 1555   RDW 12.6 03/26/2016 1555   RDW 13.2 02/01/2016 0734   LYMPHSABS 1.4 02/01/2016 0734   EOSABS 0.2 02/01/2016 0734   BASOSABS 0.0 02/01/2016 0734     PFT 02/21/17: FVC 3.26 L (81%) FEV1 2.46 L (77%) FEV1/FVC 0.76 FEF 25-75 2.03 L (67%)                                                                                                                   DLCO corrected 74% 07/18/16: FVC 3.52 L (87%) FEV1 2.57 L (80%) FEV1/FVC 0.73 FEF 25-75 1.76 L (58%) negative bronchodilator response TLC 5.17 L (92%) RV 107% ERV 65% DLCO corrected 83%  IMAGING PET CT 02/28/16 (per radiologist): Hypermetabolic mediastinal adenopathy. No intrapulmonary specific findings of sarcoidosis.  November 2018 CT chest images independently reviewed showing subsegmental atelectasis and mild tree-in-bud abnormalities predominantly in the right middle lobe, scant  similar findings in the left lower lobe, mediastinal lymphadenopathy unchanged CT chest February 21, 2017 images independently reviewed with patient showing airway thickening in the right middle lobe with some nodularity, mediastinal lymphadenopathy unchanged compared to prior images   CARDIAC TTE :  LV normal in size with EF 55-60%. No regional wall motion abnormalities. Normal LV diastolic function. LA & RA normal in size. RV normal in size and function. No aortic stenosis or regurgitation. Aortic root normal in size. No mitral stenosis or regurgitation. No pulmonic stenosis. No tricuspid regurgitation. No pericardial effusion. HOLTER MONITOR 06/21/16:  Normal sinus rhythm. Rare PACs. Moderate correlation between flutter and PAC's. "Normal study". EKG 03/26/16 : Sinus rhythm. QTC 413 ms. No conduction delays or suggestion of ischemia  PATHOLOGY BAL RML (02/13/16):  Negative for malignancy EBUS FNA Subcarinal, Right Paratracheal, & Right Hilar (02/13/16):  Negative for malignancy  Mediastinal Lymph Node Biopsy (03/29/16): Noncaseating granulomatous lymphadenitis  MICROBIOLOGY Quantitative BAL (02/13/16) Right middle lobe:  Normal Respiratory Flora / Mycobacterium avium complex / viral culture negative QuantiFERON-TB (03/08/16): Negative Right Bronchial Washings (03/29/16):  Bacteria negative / AFB negative / Anaerobic culture negative  Mediastinal Lymph Node Culture (03/29/16):  AFB negative / Fungus negative / Anaerobic culture negative   LABS 06/11/16 ACE:  74 CRP: 1.4 ESR: 1 ANA: Negative Centromere Ab Screen:  <0.2 SCL-70:  <0.2 SSA:  <0.2 SSB:  <0.2  03/26/16 CBC: 7.3/14.3/41.2/218 BMP: 140/3.5/103/29/10/0.88/107/9.9  LFT: 4.6/7.4/0.6/66/34/27  02/02/16 ACE:  66  Assessment & Plan:   Mediastinal adenopathy  MAI (mycobacterium avium-intracellulare) [facility])  Discussion Ms Berton comes to clinic today complaining of 2 months of weight loss with some night sweats.  Overall I  am concerned that this represents active MAI though weight loss could be a side effect from the increase of dose of Wellbutrin.  Today we talked about the risks and benefits of treating with 3 drug therapy for mycobacterial disease versus observation alone.  I am encouraged by the fact that she does not have cough with mucus production or increased shortness of breath so I think it is reasonable to observe at this time.  She is not willing to decrease the dose of Wellbutrin right now but she says she could do that in the spring.  We had an extensive conversation because the patient has never been confident that she has MAI.  She was under the impression that she may have sarcoidosis but she states that she has been quite frustrated with all the testing that she has had and a lack of a clear diagnosis and direction.  We talked about this extensively and I explained that I thought a repeat bronchoscopy with BAL from the right middle lobe may be of benefit, though I do not think it is necessary because I believe she does have MAI infection.  She stated that she "did not want to have any more damn tests" and would prefer to just observe things alone and see if she gets worse.  I understand her frustration because it does appear that some physicians have told her that she has sarcoidosis.  However, I have never seen MAI be a contaminant from a BAL in the past and I would not be confident in a diagnosis of sarcoidosis with a positive culture from a BAL.  I tried to explain the difference between colonization of MAI and active disease today.  It is puzzling that she had a negative bronch wash from Jan 2018 but it is not clear from the OP note if this was performed in the right middle lobe or not.  Clearly based on the CT scanning of her chest she has worrisome findings for MAI in the right middle lobe with airway thickening and some nodularity there.  This was where the original BAL was performed.  However the op note  from January 2018 but does not say exactly where they performed the BAL.  Because of her frustration I offered a second opinion from an outside university and recommended that she see Dr. Brigitte Pulse.  At this time she says she just wants to monitor for clinical worsening.  Plan: Mycobacterium avium intracellular: Please let me know if you develop cough productive of mucus, further weight loss, or respiratory complaints  Follow-up in 2 months, 30 minute slot only  > 50% of the the 1 hour I spent on this visit was spent face to face   Current Outpatient Medications:  .  ALPRAZolam (XANAX) 0.5 MG tablet, Take 1 tablet (0.5 mg total) by mouth 2 (two) times daily as needed for anxiety. Reported on 04/05/2015 (Patient taking differently: Take 0.5 mg by mouth daily as needed for anxiety. Reported on 04/05/2015), Disp: 60 tablet, Rfl: 5 .  Barberry-Oreg Grape-Goldenseal (BERBERINE COMPLEX PO), Take 500 mg by mouth 3 (three) times daily., Disp: , Rfl:  .  buPROPion (WELLBUTRIN XL) 300 MG 24 hr tablet, Take 1 tablet (300 mg total) by mouth daily., Disp: 90 tablet, Rfl: 3 .  loratadine (  CLARITIN) 10 MG tablet, Take 10 mg by mouth daily as needed for allergies. , Disp: , Rfl:  .  mometasone (NASONEX) 50 MCG/ACT nasal spray, Place 2 sprays into the nose daily as needed (congestion). , Disp: , Rfl:  .  Nutritional Supplements (JUICE PLUS FIBRE PO), Take by mouth., Disp: , Rfl:

## 2017-03-13 NOTE — Patient Instructions (Signed)
Mycobacterium avium intracellular: Please let me know if you develop cough productive of mucus, further weight loss, or respiratory complaints  Follow-up in 2 months, 30 minute slot only

## 2017-03-19 ENCOUNTER — Encounter: Payer: Self-pay | Admitting: Pulmonary Disease

## 2017-03-19 DIAGNOSIS — A310 Pulmonary mycobacterial infection: Secondary | ICD-10-CM

## 2017-03-19 DIAGNOSIS — R590 Localized enlarged lymph nodes: Secondary | ICD-10-CM

## 2017-03-21 ENCOUNTER — Telehealth: Payer: Self-pay

## 2017-03-21 ENCOUNTER — Encounter: Payer: Self-pay | Admitting: Family Medicine

## 2017-03-21 NOTE — Telephone Encounter (Signed)
Can Dr. Rosanna Randy give me a call sometime today?  I would like to talk to him about if he has seen weight loss with Wellbutrin - even while eating MORE than usual.  The best number is [phone] Thank you!  Came from my [employer], RMA

## 2017-03-24 NOTE — Telephone Encounter (Signed)
Pt checking on the status of the referral to [facility] pulmonology. Please advise on this.  Thanks!

## 2017-03-24 NOTE — Telephone Encounter (Signed)
Spoke with Chantel from [facility] who stated that the order was sent to [facility] and they will contact pt regarding the referral for her. With it being here at the holidays, it might take a little longer for them to contact pt, but the referral was sent. Nothing further needed.

## 2017-03-26 ENCOUNTER — Encounter: Payer: Self-pay | Admitting: Pulmonary Disease

## 2017-03-26 NOTE — Telephone Encounter (Signed)
Pt stated to [place] that when she saw Dr. Ola Spurr in December 2018, bloodwork was done and it was stated that she did not have MAI and when she had a bronch done recently she stated that it also came back negative for that bacteria test.  Just double-checking that Dr. Lorenda Cahill is the correct person that we are needing to be referring the patient to based off of all this.  You had specified Dr. Lorenda Cahill in your last office note that patient had with you but just trying to make sure for the patient's sake since she thought she was being referred to another pulmonologist for a second opinion.  Please advise on this.  Thanks!

## 2017-03-26 NOTE — Telephone Encounter (Signed)
Pt emailed in on 03/19/17 requesting referral to be placed to [facility] for second opinion. Referral was placed to Dr. Brigitte Pulse, [place] physician based off of your last OV note.   BQ please advise if okay to cancel ID referral and placed referral to [facility]. Thanks.

## 2017-04-03 DIAGNOSIS — H524 Presbyopia: Secondary | ICD-10-CM | POA: Diagnosis not present

## 2017-04-09 ENCOUNTER — Telehealth: Payer: Self-pay | Admitting: Pulmonary Disease

## 2017-04-09 NOTE — Telephone Encounter (Signed)
Called Barbara Frederick at [facility] [employer] office.  She needs disc from chest CT done on 02/21/17 sent to her attn.  She was able to see the culture report I sent.  She is going to check with Dr Lorenda Cahill to see if there is anything he needs.  She states if I don't hear back from her then they have all they need.  I talked to [place] at [facility] & she is going to send disc.  Nothing further needed.

## 2017-05-19 ENCOUNTER — Ambulatory Visit: Payer: Self-pay | Admitting: Pulmonary Disease

## 2017-05-19 ENCOUNTER — Other Ambulatory Visit: Payer: Self-pay | Admitting: Obstetrics & Gynecology

## 2017-05-19 DIAGNOSIS — Z1239 Encounter for other screening for malignant neoplasm of breast: Secondary | ICD-10-CM

## 2017-06-09 DIAGNOSIS — D861 Sarcoidosis of lymph nodes: Secondary | ICD-10-CM | POA: Diagnosis not present

## 2017-06-09 DIAGNOSIS — A310 Pulmonary mycobacterial infection: Secondary | ICD-10-CM | POA: Diagnosis not present

## 2017-06-11 ENCOUNTER — Ambulatory Visit
Admission: RE | Admit: 2017-06-11 | Discharge: 2017-06-11 | Disposition: A | Discharge status: Home / Self Care | Payer: 59 | Source: Ambulatory Visit | Attending: Obstetrics & Gynecology | Admitting: Obstetrics & Gynecology

## 2017-06-11 DIAGNOSIS — Z1231 Encounter for screening mammogram for malignant neoplasm of breast: Secondary | ICD-10-CM | POA: Diagnosis not present

## 2017-06-11 DIAGNOSIS — Z1239 Encounter for other screening for malignant neoplasm of breast: Secondary | ICD-10-CM

## 2017-06-18 ENCOUNTER — Ambulatory Visit: Payer: Self-pay | Admitting: Pulmonary Disease

## 2017-08-27 DIAGNOSIS — R0600 Dyspnea, unspecified: Secondary | ICD-10-CM | POA: Diagnosis not present

## 2017-08-27 DIAGNOSIS — R0602 Shortness of breath: Secondary | ICD-10-CM | POA: Diagnosis not present

## 2017-08-27 DIAGNOSIS — J479 Bronchiectasis, uncomplicated: Secondary | ICD-10-CM | POA: Diagnosis not present

## 2017-08-27 DIAGNOSIS — R05 Cough: Secondary | ICD-10-CM | POA: Diagnosis not present

## 2017-08-27 DIAGNOSIS — Z87891 Personal history of nicotine dependence: Secondary | ICD-10-CM | POA: Diagnosis not present

## 2017-08-27 DIAGNOSIS — D869 Sarcoidosis, unspecified: Secondary | ICD-10-CM | POA: Diagnosis not present

## 2017-08-27 DIAGNOSIS — A310 Pulmonary mycobacterial infection: Secondary | ICD-10-CM | POA: Diagnosis not present

## 2017-09-15 ENCOUNTER — Encounter: Payer: 59 | Admitting: Family Medicine

## 2017-09-16 ENCOUNTER — Ambulatory Visit (INDEPENDENT_AMBULATORY_CARE_PROVIDER_SITE_OTHER): Payer: 59 | Admitting: Family Medicine

## 2017-09-16 ENCOUNTER — Encounter: Payer: Self-pay | Admitting: Family Medicine

## 2017-09-16 VITALS — BP 118/72 | HR 70 | Temp 98.7°F | Resp 16 | Ht 67.5 in | Wt 150.0 lb

## 2017-09-16 DIAGNOSIS — Z0000 Encounter for general adult medical examination without abnormal findings: Secondary | ICD-10-CM | POA: Diagnosis not present

## 2017-09-16 DIAGNOSIS — Z1211 Encounter for screening for malignant neoplasm of colon: Secondary | ICD-10-CM

## 2017-09-16 NOTE — Progress Notes (Signed)
Patient: Barbara Frederick, Female    DOB: 1970-04-10, 47 y.o.   MRN: [id number] Visit Date: 09/16/2017  Today's Provider: Wilhemena Durie, MD   Chief Complaint  Patient presents with  . Annual Exam   Subjective:    Annual physical exam Barbara Frederick is a 47 y.o. female who presents today for health maintenance and complete physical. She feels well. She sleeps on average 6-7 hours nightly.  She sees dr Leonides Schanz for [employer].  Mammogram- 06/11/2017. Normal. Pap- s/p total hysterectomy. Has GYN.     Review of Systems  Constitutional: Negative.   HENT: Negative.   Eyes: Negative.   Respiratory: Positive for cough.   Cardiovascular: Negative.   Gastrointestinal: Negative.   Endocrine: Negative.   Genitourinary: Negative.   Musculoskeletal: Negative.   Skin: Negative.   Allergic/Immunologic: Positive for environmental allergies.  Neurological: Negative.   Hematological: Negative.   Psychiatric/Behavioral: Negative.     Social History      She  reports that she is a non-smoker but has been exposed to tobacco smoke. She has never used smokeless tobacco. She reports that she drinks alcohol. She reports that she does not use drugs.       Social History   Socioeconomic History  . Marital status: Married    Spouse name: Not on file  . Number of children: Not on file  . Years of education: Not on file  . Highest education level: Not on file  Occupational History  . Not on file  Social Needs  . Financial resource strain: Not on file  . Food insecurity:    Worry: Not on file    Inability: Not on file  . Transportation needs:    Medical: Not on file    Non-medical: Not on file  Tobacco Use  . Smoking status: Passive Smoke Exposure - Never Smoker  . Smokeless tobacco: Never Used  . Tobacco comment: Parents smoked around her.   Substance and Sexual Activity  . Alcohol use: Yes    Comment: 3-5/week, wine on the weekend   . Drug use: No  . Sexual activity: Not on  file  Lifestyle  . Physical activity:    Days per week: Not on file    Minutes per session: Not on file  . Stress: Not on file  Relationships  . Social connections:    Talks on phone: Not on file    Gets together: Not on file    Attends religious service: Not on file    Active member of club or organization: Not on file    Attends meetings of clubs or organizations: Not on file    Relationship status: Not on file  Other Topics Concern  . Not on file  Social History Narrative   [facility] (06/10/16):   Originally from [facility]. Previously has also lived in [place], [place], [place]. Previously has traveled to [place] a few years ago. She has had prior exposure to elephants who appeared ill. Has also traveled to [place]. Does have indoor dogs & an indoor cat. Brief bird exposure. No mold or hot tub exposure. 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 Enjoys playing tennis & skiing. She also enjoys gardening.     Past Medical History:  Diagnosis Date  . Allergy   . Anxiety   . Arthritis    L knee- OA  . Complication of anesthesia    needed smaller  ETT (anes unable to pass 8.0 or 8.5 ETT 02/13/16; 7.0 ETT used 08/24/07)     Patient Active Problem List   Diagnosis Date Noted  . Headache 02/21/2017  . Palpitations 06/10/2016  . Raynaud's phenomenon 06/10/2016  . Lymphadenopathy, mediastinal 02/02/2016  . Cough 01/22/2016  . Anxiety 09/02/2014  . Arthritis 09/02/2014  . Clinical depression 09/02/2014  . Gastro-esophageal reflux disease without esophagitis 09/02/2014  . Insomnia 09/02/2014  . Allergic rhinitis, seasonal 09/02/2014  . Breast pain, left 05/11/2014  . Umbilical hernia 20/25/4270  . Abdominal wall mass of periumbilical region 62/37/6283    Past Surgical History:  Procedure Laterality Date  . ABDOMINAL HYSTERECTOMY  2010   Dr Amalia Hailey  . AUGMENTATION MAMMAPLASTY Bilateral 2001  . BREAST ENHANCEMENT SURGERY Bilateral [employer]  . CYST EXCISION    . ENDOBRONCHIAL ULTRASOUND N/A 02/13/2016   Procedure: ENDOBRONCHIAL ULTRASOUND;  Surgeon: Laverle Hobby, MD;  Location: [facility] OR[facility];  Service: Cardiopulmonary;  Laterality: N/A;  . MEDIASTINOSCOPY N/A 03/29/2016   Procedure: Video MEDIASTINOSCOPY WITH LYMPH NODE BIOPSY;  Surgeon: Grace Isaac, MD;  Location: [facility];  Service: Thoracic;  Laterality: N/A;  . OVARY SURGERY     one ovary and cervix at [facility]  . VAGINAL DELIVERY     twins   . VIDEO BRONCHOSCOPY N/A 03/29/2016   Procedure: VIDEO BRONCHOSCOPY;  Surgeon: Grace Isaac, MD;  Location: [facility] OR;  Service: Thoracic;  Laterality: N/A;    Family History        Family Status  Relation Name Status  . PGM  Deceased       cause of death was Alzhemier's  . Mother  Alive  . Father  Alive  . Sister  Alive  . Brother  Alive  . [employer]  . MGF  Deceased  . PGF  Deceased        Her family history includes Breast cancer in her paternal aunt and paternal grandmother; Cancer (age of onset: 25) in her paternal aunt; Cancer (age of onset: 63) in her paternal grandmother; Colon cancer in her maternal grandfather; Emphysema in her father and paternal grandfather; Hypertension in her brother and mother; Lung cancer in her paternal grandfather; Raynaud syndrome in her maternal grandfather and mother; Rectal cancer in her maternal grandfather.      Allergies  Allergen Reactions  . Codeine Itching    itching     Current Outpatient Medications:  .  ALPRAZolam (XANAX) 0.5 MG tablet, Take 1 tablet (0.5 mg total) by mouth 2 (two) times daily as needed for anxiety. Reported on 04/05/2015 (Patient taking differently: Take 0.5 mg by mouth daily as needed for anxiety. Reported on 04/05/2015), Disp: 60 tablet, Rfl: 5 .  Barberry-Oreg Grape-Goldenseal (BERBERINE COMPLEX PO), Take 500 mg by mouth 3 (three) times daily., Disp: , Rfl:  .  buPROPion (WELLBUTRIN XL) 300 MG 24 hr tablet, Take 1  tablet (300 mg total) by mouth daily., Disp: 90 tablet, Rfl: 3 .  loratadine (CLARITIN) 10 MG tablet, Take 10 mg by mouth daily as needed for allergies. , Disp: , Rfl:  .  mometasone (NASONEX) 50 MCG/ACT nasal spray, Place 2 sprays into the nose daily as needed (congestion). , Disp: , Rfl:  .  Nutritional Supplements (JUICE PLUS FIBRE PO), Take by mouth., Disp: , Rfl:    Patient Care Team: Jerrol Banana., MD as PCP - General (Family Medicine) Bary Castilla, Forest Gleason, MD (General Surgery)  Patient, No Pcp Per (General Practice) Ward, Honor Loh, MD as Referring Physician (Obstetrics and Gynecology)      Objective:   Vitals: BP 118/72 (BP Location: Left Arm, Patient Position: Sitting, Cuff Size: Normal)   Pulse 70   Temp 98.7 F (37.1 C)   Resp 16   Ht 5' 7.5" (1.715 m)   Wt 150 lb (68 kg)   SpO2 97%   BMI 23.15 kg/m    Vitals:   09/16/17 1026  BP: 118/72  Pulse: 70  Resp: 16  Temp: 98.7 F (37.1 C)  SpO2: 97%  Weight: 150 lb (68 kg)  Height: 5' 7.5" (1.715 m)     Physical Exam  Constitutional: She is oriented to person, place, and time. She appears well-developed and well-nourished.  HENT:  Head: Normocephalic and atraumatic.  Right Ear: External ear normal.  Left Ear: External ear normal.  Nose: Nose normal.  Mouth/Throat: Oropharynx is clear and moist.  Eyes: Pupils are equal, round, and reactive to light. Conjunctivae are normal. No scleral icterus.  Neck: No thyromegaly present.  Cardiovascular: Normal rate, regular rhythm, normal heart sounds and intact distal pulses.  Pulmonary/Chest: Effort normal and breath sounds normal.  Abdominal: Soft.  Musculoskeletal: She exhibits no edema.  Neurological: She is alert and oriented to person, place, and time.  Skin: Skin is warm and dry.  Psychiatric: She has a normal mood and affect. Her behavior is normal. Judgment and thought content normal.     Depression Screen PHQ 2/9 Scores 09/16/2017 01/22/2016 04/05/2015  11/10/2014  PHQ - 2 Score 0 3 5 0  PHQ- 9 Score 0 12 18 -      Assessment & Plan:     Routine Health Maintenance and Physical Exam  Exercise Activities and Dietary recommendations Goals    None      Immunization History  Administered Date(s) Administered  . Influenza Split 01/06/2017  . Influenza-Unspecified 01/07/2015, 01/07/2016    Health Maintenance  Topic Date Due  . HIV Screening  04/13/1983  . TETANUS/TDAP  04/13/1987  . PAP SMEAR  04/12/1989  . INFLUENZA VACCINE  10/23/2017    Gyn per Dr Leonides Schanz.Refer to GI for screening colonoscopy in next few months. Discussed health benefits of physical activity, and encouraged her to engage in regular exercise appropriate for her age and condition.  Sarcoidosis Anxiety Use Xananx about weekly. S/p hysterectomy S/p Breast Implants   I have done the exam and reviewed the above chart and it is accurate to the best of my knowledge. Development worker, community has been used in this note in any air is in the dictation or transcription are unintentional.  Wilhemena Durie, MD  [place]

## 2017-09-17 DIAGNOSIS — Z0000 Encounter for general adult medical examination without abnormal findings: Secondary | ICD-10-CM | POA: Diagnosis not present

## 2017-09-18 ENCOUNTER — Other Ambulatory Visit: Payer: Self-pay

## 2017-09-18 DIAGNOSIS — F419 Anxiety disorder, unspecified: Secondary | ICD-10-CM

## 2017-09-18 LAB — COMPREHENSIVE METABOLIC PANEL
ALBUMIN: 4.7 g/dL (ref 3.5–5.5)
ALK PHOS: 71 IU/L (ref 39–117)
ALT: 17 IU/L (ref 0–32)
AST: 26 IU/L (ref 0–40)
Albumin/Globulin Ratio: 2.1 (ref 1.2–2.2)
BILIRUBIN TOTAL: 0.5 mg/dL (ref 0.0–1.2)
BUN / CREAT RATIO: 12 (ref 9–23)
BUN: 11 mg/dL (ref 6–24)
CHLORIDE: 100 mmol/L (ref 96–106)
CO2: 24 mmol/L (ref 20–29)
Calcium: 9.7 mg/dL (ref 8.7–10.2)
Creatinine, Ser: 0.91 mg/dL (ref 0.57–1.00)
GFR calc Af Amer: 86 mL/min/{1.73_m2} (ref >59)
GFR calc non Af Amer: 74 mL/min/{1.73_m2} (ref >59)
Globulin, Total: 2.2 g/dL (ref 1.5–4.5)
Glucose: 95 mg/dL (ref 65–99)
Potassium: 3.7 mmol/L (ref 3.5–5.2)
Sodium: 144 mmol/L (ref 134–144)
Total Protein: 6.9 g/dL (ref 6.0–8.5)

## 2017-09-18 LAB — CBC WITH DIFFERENTIAL/PLATELET
BASOS ABS: 0.1 10*3/uL (ref 0.0–0.2)
Basos: 1 %
EOS (ABSOLUTE): 0.2 10*3/uL (ref 0.0–0.4)
Eos: 4 %
HEMOGLOBIN: 13.8 g/dL (ref 11.1–15.9)
Hematocrit: 41 % (ref 34.0–46.6)
Immature Grans (Abs): 0 10*3/uL (ref 0.0–0.1)
Immature Granulocytes: 0 %
LYMPHS ABS: 1 10*3/uL (ref 0.7–3.1)
Lymphs: 18 %
MCH: 30.7 pg (ref 26.6–33.0)
MCHC: 33.7 g/dL (ref 31.5–35.7)
MCV: 91 fL (ref 79–97)
MONOS ABS: 0.8 10*3/uL (ref 0.1–0.9)
Monocytes: 14 %
Neutrophils Absolute: 3.5 10*3/uL (ref 1.4–7.0)
Neutrophils: 63 %
PLATELETS: 249 10*3/uL (ref 150–450)
RBC: 4.5 x10E6/uL (ref 3.77–5.28)
RDW: 12.3 % (ref 12.3–15.4)
WBC: 5.5 10*3/uL (ref 3.4–10.8)

## 2017-09-18 LAB — LIPID PANEL
CHOLESTEROL TOTAL: 215 mg/dL — AB (ref 100–199)
Chol/HDL Ratio: 3.4 ratio (ref 0.0–4.4)
HDL: 63 mg/dL (ref >39)
LDL Calculated: 124 mg/dL — ABNORMAL HIGH (ref 0–99)
TRIGLYCERIDES: 142 mg/dL (ref 0–149)
VLDL CHOLESTEROL CAL: 28 mg/dL (ref 5–40)

## 2017-09-18 LAB — TSH: TSH: 1.81 u[IU]/mL (ref 0.450–4.500)

## 2017-09-19 MED ORDER — ALPRAZOLAM 0.5 MG PO TABS: 0.5000 mg | ORAL_TABLET | Freq: Two times a day (BID) | ORAL | 5 refills | Status: DC | PRN

## 2017-09-23 ENCOUNTER — Telehealth: Payer: 59 | Admitting: Nurse Practitioner

## 2017-09-23 ENCOUNTER — Telehealth: Payer: Self-pay | Admitting: Family Medicine

## 2017-09-23 DIAGNOSIS — N3000 Acute cystitis without hematuria: Secondary | ICD-10-CM | POA: Diagnosis not present

## 2017-09-23 MED ORDER — NITROFURANTOIN MONOHYD MACRO 100 MG PO CAPS: 100.0000 mg | ORAL_CAPSULE | Freq: Two times a day (BID) | ORAL | 0 refills | Status: DC

## 2017-09-23 NOTE — Telephone Encounter (Signed)
Pt stated [place] and thinks she has a UTI. Pt is requesting an Rx for UTI to be sent to [facility] Phone# [phone] Fax#[phone]. Pt was advised that we don't treat over phone. Pt stated that she understands and still requested a message be sent to Dr. Rosanna Randy. [place] Please advise. Thanks TNP

## 2017-09-23 NOTE — Progress Notes (Signed)

## 2017-09-24 NOTE — Telephone Encounter (Signed)
Nitrofurantoin 100mg BID for 3 days. 

## 2017-09-24 NOTE — Telephone Encounter (Signed)
Patient already had an e-visit and they sent in the [facility]

## 2017-09-24 NOTE — Telephone Encounter (Signed)
Please advise 

## 2017-12-29 DIAGNOSIS — M25512 Pain in left shoulder: Secondary | ICD-10-CM | POA: Insufficient documentation

## 2017-12-30 DIAGNOSIS — M7711 Lateral epicondylitis, right elbow: Secondary | ICD-10-CM | POA: Diagnosis not present

## 2018-01-02 ENCOUNTER — Other Ambulatory Visit: Payer: Self-pay | Admitting: Family Medicine

## 2018-01-02 DIAGNOSIS — F339 Major depressive disorder, recurrent, unspecified: Secondary | ICD-10-CM

## 2018-01-02 NOTE — Telephone Encounter (Signed)
Pt requesting a refill on:  buPROPion (WELLBUTRIN XL) 300 MG 24 hr tablet  Please fill at:  [facility] [place] [phone] (Phone) [phone] (Fax)    Thanks, [facility]

## 2018-01-06 MED ORDER — BUPROPION HCL ER (XL) 300 MG PO TB24: 300.0000 mg | ORAL_TABLET | Freq: Every day | ORAL | 3 refills | Status: DC

## 2018-02-05 DIAGNOSIS — M7711 Lateral epicondylitis, right elbow: Secondary | ICD-10-CM | POA: Diagnosis not present

## 2018-02-16 ENCOUNTER — Encounter: Payer: Self-pay | Admitting: Family Medicine

## 2018-02-18 ENCOUNTER — Telehealth: Payer: Self-pay | Admitting: Family Medicine

## 2018-02-18 ENCOUNTER — Encounter: Payer: Self-pay | Admitting: Family Medicine

## 2018-02-18 ENCOUNTER — Ambulatory Visit
Admission: RE | Admit: 2018-02-18 | Discharge: 2018-02-18 | Disposition: A | Discharge status: Home / Self Care | Payer: 59 | Source: Ambulatory Visit | Attending: Family Medicine | Admitting: Family Medicine

## 2018-02-18 ENCOUNTER — Ambulatory Visit (INDEPENDENT_AMBULATORY_CARE_PROVIDER_SITE_OTHER): Payer: 59 | Admitting: Family Medicine

## 2018-02-18 VITALS — BP 143/83 | HR 94 | Temp 98.0°F | Resp 16 | Wt 157.0 lb

## 2018-02-18 DIAGNOSIS — M50321 Other cervical disc degeneration at C4-C5 level: Secondary | ICD-10-CM | POA: Diagnosis not present

## 2018-02-18 DIAGNOSIS — M50322 Other cervical disc degeneration at C5-C6 level: Secondary | ICD-10-CM | POA: Diagnosis not present

## 2018-02-18 DIAGNOSIS — M25512 Pain in left shoulder: Secondary | ICD-10-CM | POA: Insufficient documentation

## 2018-02-18 DIAGNOSIS — F419 Anxiety disorder, unspecified: Secondary | ICD-10-CM

## 2018-02-18 DIAGNOSIS — M5030 Other cervical disc degeneration, unspecified cervical region: Secondary | ICD-10-CM | POA: Diagnosis not present

## 2018-02-18 DIAGNOSIS — R0602 Shortness of breath: Secondary | ICD-10-CM | POA: Diagnosis not present

## 2018-02-18 DIAGNOSIS — R079 Chest pain, unspecified: Secondary | ICD-10-CM | POA: Diagnosis not present

## 2018-02-18 DIAGNOSIS — R05 Cough: Secondary | ICD-10-CM | POA: Diagnosis not present

## 2018-02-18 MED ORDER — ALPRAZOLAM 0.5 MG PO TABS: 0.5000 mg | ORAL_TABLET | Freq: Two times a day (BID) | ORAL | 5 refills | Status: AC | PRN

## 2018-02-18 MED ORDER — BUPROPION HCL ER (XL) 150 MG PO TB24: 150.0000 mg | ORAL_TABLET | Freq: Every day | ORAL | 1 refills | Status: DC

## 2018-02-18 MED ORDER — PREDNISONE 10 MG PO TABS: ORAL_TABLET | ORAL | 0 refills | Status: DC

## 2018-02-18 MED ORDER — HYDROCODONE-ACETAMINOPHEN 5-325 MG PO TABS: 1.0000 | ORAL_TABLET | ORAL | 0 refills | Status: DC | PRN

## 2018-02-18 NOTE — Progress Notes (Signed)
Patient: Barbara Frederick Female    DOB: [birth year]   47 y.o.   MRN: [id number] Visit Date: 02/18/2018  Today's Provider: Wilhemena Durie, MD   Chief Complaint  Patient presents with  . Shoulder Pain   Subjective:    Shoulder Pain   The pain is present in the left shoulder and left arm. This is a new problem. The current episode started more than 1 month ago. There has been no history of extremity trauma. The problem occurs constantly. The problem has been gradually worsening. The quality of the pain is described as aching and sharp. The pain is at a severity of 9/10. The pain is severe. Pertinent negatives include no fever, inability to bear weight, itching, joint locking, joint swelling, limited range of motion, numbness, stiffness or tingling. She has tried heat, rest, acetaminophen, NSAIDS and OTC pain meds for the symptoms. The treatment provided no relief.    Patient states she has had left shoulder pain for over 1 month. Patient states 3 days her left shoulder pain worsened to a severe pain. Patient states pain is aching to sharp and constant. Patient has self treated at home with Aleve, heat, ice and rest with no relief. It is not exertional and does not get worse with movement.  Allergies  Allergen Reactions  . Codeine Itching    itching     Current Outpatient Medications:  .  ALPRAZolam (XANAX) 0.5 MG tablet, Take 1 tablet (0.5 mg total) by mouth 2 (two) times daily as needed for anxiety. Reported on 04/05/2015, Disp: 60 tablet, Rfl: 5 .  Barberry-Oreg Grape-Goldenseal (BERBERINE COMPLEX PO), Take 500 mg by mouth 3 (three) times daily., Disp: , Rfl:  .  buPROPion (WELLBUTRIN XL) 300 MG 24 hr tablet, Take 1 tablet (300 mg total) by mouth daily., Disp: 90 tablet, Rfl: 3 .  loratadine (CLARITIN) 10 MG tablet, Take 10 mg by mouth daily as needed for allergies. , Disp: , Rfl:  .  mometasone (NASONEX) 50 MCG/ACT nasal spray, Place 2 sprays into the nose daily as needed  (congestion). , Disp: , Rfl:  .  nitrofurantoin, macrocrystal-monohydrate, (MACROBID) 100 MG capsule, Take 1 capsule (100 mg total) by mouth 2 (two) times daily. 1 po BId, Disp: 14 capsule, Rfl: 0 .  Nutritional Supplements (JUICE PLUS FIBRE PO), Take by mouth., Disp: , Rfl:   Review of Systems  Constitutional: Negative for appetite change, chills, fatigue and fever.  Eyes: Negative.   Respiratory: Negative for chest tightness and shortness of breath.   Cardiovascular: Negative for chest pain and palpitations.  Gastrointestinal: Negative for abdominal pain, nausea and vomiting.  Endocrine: Negative.   Musculoskeletal: Negative for stiffness.  Skin: Negative for itching.  Allergic/Immunologic: Negative.   Neurological: Negative for dizziness, tingling, weakness and numbness.  Hematological: Negative.   Psychiatric/Behavioral: Negative.     Social History   Tobacco Use  . Smoking status: Passive Smoke Exposure - Never Smoker  . Smokeless tobacco: Never Used  . Tobacco comment: Parents smoked around her.   Substance Use Topics  . Alcohol use: Yes    Comment: 3-5/week, wine on the weekend    Objective:   BP (!) 143/83 (BP Location: Right Arm, Patient Position: Sitting, Cuff Size: Large)   Pulse 94   Temp 98 F (36.7 C) (Oral)   Resp 16   Wt 157 lb (71.2 kg)   SpO2 100%   BMI 24.23 kg/m  Vitals:   02/18/18 0944  BP: (!) 143/83  Pulse: 94  Resp: 16  Temp: 98 F (36.7 C)  TempSrc: Oral  SpO2: 100%  Weight: 157 lb (71.2 kg)     Physical Exam  Constitutional: She is oriented to person, place, and time. She appears well-developed and well-nourished.  HENT:  Head: Normocephalic and atraumatic.  Right Ear: External ear normal.  Left Ear: External ear normal.  Nose: Nose normal.  Mouth/Throat: Oropharynx is clear and moist.  Eyes: Conjunctivae are normal. No scleral icterus.  Neck: No thyromegaly present.  Cardiovascular: Normal rate, regular rhythm and normal heart  sounds.  Pulmonary/Chest: Effort normal and breath sounds normal.  Abdominal: Soft.  Neurological: She is alert and oriented to person, place, and time. No cranial nerve deficit. Coordination normal.  Grossly nonfocal.  Skin: Skin is warm and dry.  Psychiatric: She has a normal mood and affect. Her behavior is normal. Judgment and thought content normal.        Assessment & Plan:     1. DDD (degenerative disc disease), cervical  - DG Chest 2 View - DG Shoulder Left - DG Cervical Spine Complete  2. Acute anxiety  - ALPRAZolam (XANAX) 0.5 MG tablet; Take 1 tablet (0.5 mg total) by mouth 2 (two) times daily as needed for anxiety. Reported on 04/05/2015  Dispense: 60 tablet; Refill: 5  3. Acute pain of left shoulder This clinically is  A cervical radiculopathy. Obtain Xrays and Rx as above. RTC next week--may need MRI/Referral.      I have done the exam and reviewed the above chart and it is accurate to the best of my knowledge. Development worker, community has been used in this note in any air is in the dictation or transcription are unintentional.  Wilhemena Durie, MD  [place]

## 2018-02-18 NOTE — Patient Instructions (Addendum)
Left shoulder Pain  -cxr -xr left shoulder -c-spine   -predniSONE (DELTASONE) 10 MG tablet-6 day taper  -HYDROcodone-acetaminophen (NORCO/VICODIN) 5-325 MG tablet-1 tablet q4hs prn #55 0 refills  -changed bupropion from 300 mg qd to 150 mg qd. #90 with 1 refill  -refilled ALPRAZolam (XANAX) 0.5 MG tablet #60 with 5 refills  Return for follow-up in 1 week.

## 2018-02-18 NOTE — Telephone Encounter (Signed)
Message seen and responded to by Dr. Rosanna Randy.

## 2018-02-18 NOTE — Telephone Encounter (Signed)
Pt stated that she has a question about taking predniSONE (DELTASONE) 10 MG tablet and has sent a My Chart message about the medication. Pt is requesting a call back or response to her MyChart message. Please advise. Thanks TNP

## 2018-02-20 ENCOUNTER — Telehealth: Payer: Self-pay | Admitting: Family Medicine

## 2018-02-20 NOTE — Telephone Encounter (Signed)
-----   Message from Jerrol Banana., MD sent at 02/18/2018  4:22 PM EST ----- Moderate DDD on xray--this is expected. Treatment  plan as discussed.

## 2018-02-20 NOTE — Progress Notes (Signed)
Advised  ED 

## 2018-02-20 NOTE — Telephone Encounter (Signed)
Please review, she needs something different due to side effects

## 2018-02-20 NOTE — Telephone Encounter (Signed)
Pt is asking if there is another medication she can take for her pain.  The HYDROcodone-acetaminophen (NORCO/VICODIN) 5-325 MG tablet makes her vomit and have nausea.  Please advise.  Thanks, [employer]

## 2018-02-23 ENCOUNTER — Telehealth: Payer: Self-pay

## 2018-02-23 NOTE — Telephone Encounter (Signed)
Please advise 

## 2018-02-23 NOTE — Telephone Encounter (Signed)
Ms. Antonetti was in the office on 02/18/2018 for shoulder pain and DDD cervical.  She has a follow up appointment for 02/25/2018 but would like to go ahead with a referral to a specialist.  Pt states the pain is not much better and is having trouble with arm movement.     Contact [phone]   Thanks,   -Mickel Baas

## 2018-02-25 ENCOUNTER — Ambulatory Visit (INDEPENDENT_AMBULATORY_CARE_PROVIDER_SITE_OTHER): Payer: 59 | Admitting: Family Medicine

## 2018-02-25 VITALS — BP 142/86 | HR 102 | Temp 97.5°F | Resp 16 | Wt 155.0 lb

## 2018-02-25 DIAGNOSIS — M25512 Pain in left shoulder: Secondary | ICD-10-CM

## 2018-02-25 MED ORDER — TRAMADOL HCL 50 MG PO TABS
50.0000 mg | ORAL_TABLET | Freq: Four times a day (QID) | ORAL | 0 refills | Status: DC | PRN
Start: 2018-02-25 — End: 2019-06-16

## 2018-02-25 MED ORDER — FENTANYL 12 MCG/HR TD PT72: 12.5000 ug | MEDICATED_PATCH | TRANSDERMAL | 0 refills | Status: DC

## 2018-02-25 NOTE — Progress Notes (Signed)
Barbara Frederick  MRN: [id number] DOB: 23-Jan-1971  Subjective:  HPI   The patient is a 47 year old female who presents for a 1 week follow up of shoulder pain.  She has made several calls to the office about her pain stating that it had not gotten any better and she would like to be referred to a specialist. The patient states she is still having pain and the more worrisome symptom is that she is having progressively decreasing function of the shoulder.  Patient Active Problem List   Diagnosis Date Noted  . Headache 02/21/2017  . Palpitations 06/10/2016  . Raynaud's phenomenon 06/10/2016  . Lymphadenopathy, mediastinal 02/02/2016  . Cough 01/22/2016  . Anxiety 09/02/2014  . Arthritis 09/02/2014  . Clinical depression 09/02/2014  . Gastro-esophageal reflux disease without esophagitis 09/02/2014  . Insomnia 09/02/2014  . Allergic rhinitis, seasonal 09/02/2014  . Breast pain, left 05/11/2014  . Umbilical hernia 78/93/8101  . Abdominal wall mass of periumbilical region 75/12/2583    Past Medical History:  Diagnosis Date  . Allergy   . Anxiety   . Arthritis    L knee- OA  . Complication of anesthesia    needed smaller ETT (anes unable to pass 8.0 or 8.5 ETT 02/13/16; 7.0 ETT used 08/24/07)    Social History   Socioeconomic History  . Marital status: Married    Spouse name: Not on file  . Number of children: Not on file  . Years of education: Not on file  . Highest education level: Not on file  Occupational History  . Not on file  Social Needs  . Financial resource strain: Not on file  . Food insecurity:    Worry: Not on file    Inability: Not on file  . Transportation needs:    Medical: Not on file    Non-medical: Not on file  Tobacco Use  . Smoking status: Passive Smoke Exposure - Never Smoker  . Smokeless tobacco: Never Used  . Tobacco comment: Parents smoked around her.   Substance and Sexual Activity  . Alcohol use: Yes    Comment: 3-5/week, wine on the  weekend   . Drug use: No  . Sexual activity: Not on file  Lifestyle  . Physical activity:    Days per week: Not on file    Minutes per session: Not on file  . Stress: Not on file  Relationships  . Social connections:    Talks on phone: Not on file    Gets together: Not on file    Attends religious service: Not on file    Active member of club or organization: Not on file    Attends meetings of clubs or organizations: Not on file    Relationship status: Not on file  . Intimate partner violence:    Fear of current or ex partner: Not on file    Emotionally abused: Not on file    Physically abused: Not on file    Forced sexual activity: Not on file  Other Topics Concern  . Not on file  Social History Narrative   [facility] (06/10/16):   Originally from [facility]. Previously has also lived in [place], [place], [place]. Previously has traveled to [place] a few years ago. She has had prior exposure to elephants who appeared ill. Has also traveled to [place]. Does have indoor dogs & an indoor cat. Brief bird exposure. No mold or hot tub exposure. 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 Enjoys playing tennis & skiing. She also enjoys gardening.     Outpatient Encounter Medications as of 02/25/2018  Medication Sig  . ALPRAZolam (XANAX) 0.5 MG tablet Take 1 tablet (0.5 mg total) by mouth 2 (two) times daily as needed for anxiety. Reported on 04/05/2015  . Barberry-Oreg Grape-Goldenseal (BERBERINE COMPLEX PO) Take 500 mg by mouth 3 (three) times daily.  Marland Kitchen buPROPion (WELLBUTRIN XL) 150 MG 24 hr tablet Take 1 tablet (150 mg total) by mouth daily.  Marland Kitchen loratadine (CLARITIN) 10 MG tablet Take 10 mg by mouth daily as needed for allergies.   . mometasone (NASONEX) 50 MCG/ACT nasal spray Place 2 sprays into the nose daily as needed (congestion).   . Nutritional Supplements (JUICE PLUS FIBRE PO) Take by mouth.  . [DISCONTINUED] HYDROcodone-acetaminophen  (NORCO/VICODIN) 5-325 MG tablet Take 1 tablet by mouth every 4 (four) hours as needed for moderate pain.  . [DISCONTINUED] nitrofurantoin, macrocrystal-monohydrate, (MACROBID) 100 MG capsule Take 1 capsule (100 mg total) by mouth 2 (two) times daily. 1 po BId  . [DISCONTINUED] predniSONE (DELTASONE) 10 MG tablet Take 6 tablets day 1, 5 tablets day 2, 4 tablets day 3, 3 tablets day 4, 2 tablets day 5, 1 tablet day 6   No facility-administered encounter medications on file as of 02/25/2018.     Allergies  Allergen Reactions  . Codeine Itching    itching    Review of Systems  Constitutional: Negative.   Eyes: Negative.   Respiratory: Negative for cough, shortness of breath and wheezing.   Cardiovascular: Negative for chest pain and palpitations.  Gastrointestinal: Negative.   Musculoskeletal: Positive for joint pain (left shoulder). Negative for falls.  Skin: Negative.   Endo/Heme/Allergies: Negative.   Psychiatric/Behavioral: Negative.     Objective:  BP (!) 142/86 (BP Location: Right Arm, Patient Position: Sitting, Cuff Size: Normal)   Pulse (!) 102   Temp (!) 97.5 F (36.4 C) (Oral)   Resp 16   Wt 155 lb (70.3 kg)   SpO2 98%   BMI 23.92 kg/m   Physical Exam  Constitutional: She is oriented to person, place, and time and well-developed, well-nourished, and in no distress.  HENT:  Head: Normocephalic and atraumatic.  Right Ear: External ear normal.  Left Ear: External ear normal.  Nose: Nose normal.  Eyes: Conjunctivae are normal. No scleral icterus.  Neck: No thyromegaly present.  Cardiovascular: Normal rate, regular rhythm and normal heart sounds.  Pulmonary/Chest: Effort normal and breath sounds normal.  Abdominal: Soft.  Musculoskeletal: She exhibits no edema.  She has pain with abduction of the left shoulder beyond 90 degrees.  This is a new finding from last week.  Neurological: She is alert and oriented to person, place, and time. Gait normal. GCS score is 15.    Exam of upper extremities reveals strength to be normal on both sides.  Skin: Skin is warm and dry.  Psychiatric: Mood, memory, affect and judgment normal.    Assessment and Plan :  1. Acute pain of left shoulder Initially this appeared to be a cervical radiculopathy but now looks to be an impingement syndrome of the left shoulder. Patient was intolerant of Vicodin.  We will try tramadol.  She is complaining so much of the pain that if the tramadol does not work have given her a few patches of fentanyl at 12 mcg every 3 days. - traMADol (ULTRAM) 50 MG tablet; Take 1 tablet (50 mg total) by mouth every 6 (  six) hours as needed.  Dispense: 60 tablet; Refill: 0 - fentaNYL (DURAGESIC) 12 MCG/HR; Place 1 patch (12.5 mcg total) onto the skin every 3 (three) days.  Dispense: 5 patch; Refill: 0 - Ambulatory referral to Orthopedic Surgery  I have done the exam and reviewed the chart and it is accurate to the best of my knowledge. Development worker, community has been used and  any errors in dictation or transcription are unintentional. Miguel Aschoff M.D. [facility]

## 2018-02-26 ENCOUNTER — Telehealth: Payer: Self-pay | Admitting: Family Medicine

## 2018-02-26 DIAGNOSIS — M5412 Radiculopathy, cervical region: Secondary | ICD-10-CM | POA: Diagnosis not present

## 2018-02-26 DIAGNOSIS — M7711 Lateral epicondylitis, right elbow: Secondary | ICD-10-CM | POA: Insufficient documentation

## 2018-02-26 NOTE — Telephone Encounter (Signed)
Patient has appointment with ortho.  She states that as she has been trying to figure this out she realized that this started after getting her flu shot.  She believes they may have hit the tendon.  I discussed the possibility of that and it does fit her symptoms that she has a tendonitis.  She will have Dr Sabra Heck send [place] his notes after the visit.

## 2018-02-26 NOTE — Telephone Encounter (Signed)
Pt has a new theory about what is going on.  Please call pt to discuss.  Please advise.  Thanks, [employer]

## 2018-03-04 DIAGNOSIS — M6281 Muscle weakness (generalized): Secondary | ICD-10-CM | POA: Diagnosis not present

## 2018-03-04 DIAGNOSIS — M5412 Radiculopathy, cervical region: Secondary | ICD-10-CM | POA: Diagnosis not present

## 2018-03-05 DIAGNOSIS — M25512 Pain in left shoulder: Secondary | ICD-10-CM | POA: Diagnosis not present

## 2018-03-05 DIAGNOSIS — M542 Cervicalgia: Secondary | ICD-10-CM | POA: Diagnosis not present

## 2018-03-05 DIAGNOSIS — M5412 Radiculopathy, cervical region: Secondary | ICD-10-CM | POA: Diagnosis not present

## 2018-03-06 DIAGNOSIS — M25512 Pain in left shoulder: Secondary | ICD-10-CM | POA: Diagnosis not present

## 2018-03-06 DIAGNOSIS — M5412 Radiculopathy, cervical region: Secondary | ICD-10-CM | POA: Diagnosis not present

## 2018-03-09 DIAGNOSIS — M25512 Pain in left shoulder: Secondary | ICD-10-CM | POA: Diagnosis not present

## 2018-03-09 DIAGNOSIS — M542 Cervicalgia: Secondary | ICD-10-CM | POA: Diagnosis not present

## 2018-03-09 DIAGNOSIS — M5412 Radiculopathy, cervical region: Secondary | ICD-10-CM | POA: Diagnosis not present

## 2018-03-11 DIAGNOSIS — M6281 Muscle weakness (generalized): Secondary | ICD-10-CM | POA: Diagnosis not present

## 2018-03-11 DIAGNOSIS — M25512 Pain in left shoulder: Secondary | ICD-10-CM | POA: Diagnosis not present

## 2018-03-11 DIAGNOSIS — R0600 Dyspnea, unspecified: Secondary | ICD-10-CM | POA: Diagnosis not present

## 2018-03-11 DIAGNOSIS — M5412 Radiculopathy, cervical region: Secondary | ICD-10-CM | POA: Diagnosis not present

## 2018-03-11 DIAGNOSIS — A310 Pulmonary mycobacterial infection: Secondary | ICD-10-CM | POA: Diagnosis not present

## 2018-03-11 DIAGNOSIS — M25519 Pain in unspecified shoulder: Secondary | ICD-10-CM | POA: Diagnosis not present

## 2018-03-11 DIAGNOSIS — A319 Mycobacterial infection, unspecified: Secondary | ICD-10-CM | POA: Diagnosis not present

## 2018-03-11 DIAGNOSIS — J479 Bronchiectasis, uncomplicated: Secondary | ICD-10-CM | POA: Diagnosis not present

## 2018-03-11 DIAGNOSIS — D869 Sarcoidosis, unspecified: Secondary | ICD-10-CM | POA: Diagnosis not present

## 2018-03-13 DIAGNOSIS — M6281 Muscle weakness (generalized): Secondary | ICD-10-CM | POA: Diagnosis not present

## 2018-03-13 DIAGNOSIS — M5412 Radiculopathy, cervical region: Secondary | ICD-10-CM | POA: Diagnosis not present

## 2018-03-26 DIAGNOSIS — M6281 Muscle weakness (generalized): Secondary | ICD-10-CM | POA: Diagnosis not present

## 2018-03-26 DIAGNOSIS — M5412 Radiculopathy, cervical region: Secondary | ICD-10-CM | POA: Diagnosis not present

## 2018-03-30 DIAGNOSIS — M5412 Radiculopathy, cervical region: Secondary | ICD-10-CM | POA: Diagnosis not present

## 2018-03-30 DIAGNOSIS — M6281 Muscle weakness (generalized): Secondary | ICD-10-CM | POA: Diagnosis not present

## 2018-04-03 DIAGNOSIS — M6281 Muscle weakness (generalized): Secondary | ICD-10-CM | POA: Diagnosis not present

## 2018-04-03 DIAGNOSIS — M5412 Radiculopathy, cervical region: Secondary | ICD-10-CM | POA: Diagnosis not present

## 2018-04-06 DIAGNOSIS — M6281 Muscle weakness (generalized): Secondary | ICD-10-CM | POA: Diagnosis not present

## 2018-04-06 DIAGNOSIS — M5412 Radiculopathy, cervical region: Secondary | ICD-10-CM | POA: Diagnosis not present

## 2018-04-08 DIAGNOSIS — M6281 Muscle weakness (generalized): Secondary | ICD-10-CM | POA: Diagnosis not present

## 2018-04-08 DIAGNOSIS — M5412 Radiculopathy, cervical region: Secondary | ICD-10-CM | POA: Diagnosis not present

## 2018-05-11 ENCOUNTER — Telehealth: Payer: Self-pay | Admitting: Family Medicine

## 2018-05-11 ENCOUNTER — Other Ambulatory Visit: Payer: Self-pay | Admitting: Family Medicine

## 2018-05-11 DIAGNOSIS — Z1211 Encounter for screening for malignant neoplasm of colon: Secondary | ICD-10-CM

## 2018-05-11 DIAGNOSIS — Z1231 Encounter for screening mammogram for malignant neoplasm of breast: Secondary | ICD-10-CM

## 2018-05-11 NOTE — Telephone Encounter (Signed)
Pt wanting to get started with getting her colonoscopy done.  Please advise.  Thanks, [employer]

## 2018-05-12 DIAGNOSIS — H524 Presbyopia: Secondary | ICD-10-CM | POA: Diagnosis not present

## 2018-05-12 NOTE — Telephone Encounter (Signed)
Order for colonoscopy placed.

## 2018-05-12 NOTE — Telephone Encounter (Signed)
Ok to order? Please advise. Thanks!  

## 2018-05-12 NOTE — Telephone Encounter (Signed)
Ok thx.

## 2018-05-14 ENCOUNTER — Other Ambulatory Visit: Payer: Self-pay

## 2018-05-14 DIAGNOSIS — Z1211 Encounter for screening for malignant neoplasm of colon: Secondary | ICD-10-CM

## 2018-05-14 DIAGNOSIS — Z800 Family history of malignant neoplasm of digestive organs: Secondary | ICD-10-CM

## 2018-06-12 ENCOUNTER — Telehealth: Payer: Self-pay

## 2018-06-12 NOTE — Telephone Encounter (Signed)
Contacted pt to inform that we will be canceling their upcoming endoscopy procedure due to precautions for the COVID-19 outbreak. Pt will be rescheduled in the near future.  Unable to contact, left detailed VM 

## 2018-06-17 ENCOUNTER — Ambulatory Visit: Admit: 2018-06-17 | Payer: 59 | Admitting: Gastroenterology

## 2018-06-17 SURGERY — COLONOSCOPY WITH PROPOFOL
Anesthesia: General

## 2018-07-10 ENCOUNTER — Encounter: Payer: Self-pay | Admitting: Family Medicine

## 2018-07-13 MED ORDER — BUPROPION HCL ER (XL) 300 MG PO TB24: 300.0000 mg | ORAL_TABLET | Freq: Every day | ORAL | 5 refills | Status: DC

## 2018-08-24 IMAGING — CT NM PET TUM IMG INITIAL (PI) SKULL BASE T - THIGH
1 of 9 series · 1 of 25 positions shown · non-contrast
Comparison: 01/31/2016 chest CT

CLINICAL DATA: Initial Treatment strategy for mediastinal
adenopathy was negative for malignancy at cytology..

EXAM:
NUCLEAR MEDICINE PET SKULL BASE TO THIGH
TECHNIQUE: 12.5 mCi F-18 FDG was injected intravenously. Full-ring PET imaging
was performed from the skull base to thigh after the radiotracer. CT
data was obtained and used for attenuation correction and anatomic
localization.
FASTING BLOOD GLUCOSE:  Value: 111 mg/dl

[Series 4: pet wb (ac) · axial · 5.0mm · 4.07mm/px · 1 of 290 slices shown]
[im 193/290]
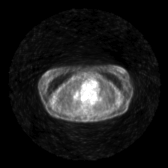

[1 of 25 positions shown; findings below may reference images not displayed]

FINDINGS: NECK

No hypermetabolic lymph nodes in the neck.

CHEST

Mediastinal and bilateral hilar adenopathy is again noted, a right
paratracheal lymph node measures 1.5 cm in short axis on image 77/3
and previously measured 1.6 cm; this node has a maximum standard
uptake value of 8.0. The other prevascular, AP window, and
subcarinal lymph nodes are likewise hypermetabolic with the
subcarinal node having maximum SUV 9.6. Faint bilateral hilar
activity although less striking than the mediastinal activity.
Background mediastinal blood pool SUV is 2.5. A small left
supraclavicular node on image 54 series 3 measures 6 mm in short
axis but is only very faintly hypermetabolic, maximum SUV 3.0. No
axillary adenopathy.

No compelling peribronchovascular or pleural-based nodularity in the
lungs ; there is some mild scarring or atelectasis in the right
middle lobe and lingula. Bilateral breast implants.

ABDOMEN/PELVIS

No abnormal hypermetabolic activity within the liver, pancreas,
adrenal glands, or spleen. No hypermetabolic lymph nodes in the
abdomen or pelvis.

SKELETON

No focal hypermetabolic activity to suggest skeletal metastasis.
IMPRESSION: 1. Hypermetabolic mediastinal adenopathy. Appearance suggests either
malignancy or granulomatous process. On recent biopsies sampling of
several lymph nodes was negative for malignancy. Granulomatous
processes such as sarcoidosis or infection with certain agents such
as histoplasmosis or tuberculosis could cause adenopathy without
findings of malignancy. No specific intrapulmonary findings of
sarcoidosis (no peribronchovascular nodularity or significant degree
of subpleural nodularity).

## 2018-09-07 ENCOUNTER — Ambulatory Visit
Admission: RE | Admit: 2018-09-07 | Discharge: 2018-09-07 | Disposition: A | Discharge status: Home / Self Care | Payer: 59 | Source: Ambulatory Visit | Attending: Family Medicine | Admitting: Family Medicine

## 2018-09-07 ENCOUNTER — Other Ambulatory Visit: Payer: Self-pay

## 2018-09-07 DIAGNOSIS — Z1231 Encounter for screening mammogram for malignant neoplasm of breast: Secondary | ICD-10-CM | POA: Insufficient documentation

## 2018-09-10 DIAGNOSIS — J479 Bronchiectasis, uncomplicated: Secondary | ICD-10-CM | POA: Diagnosis not present

## 2018-09-10 DIAGNOSIS — D869 Sarcoidosis, unspecified: Secondary | ICD-10-CM | POA: Diagnosis not present

## 2018-09-10 DIAGNOSIS — R0600 Dyspnea, unspecified: Secondary | ICD-10-CM | POA: Diagnosis not present

## 2018-09-10 DIAGNOSIS — A310 Pulmonary mycobacterial infection: Secondary | ICD-10-CM | POA: Diagnosis not present

## 2018-09-10 DIAGNOSIS — A319 Mycobacterial infection, unspecified: Secondary | ICD-10-CM | POA: Diagnosis not present

## 2018-10-05 ENCOUNTER — Ambulatory Visit (INDEPENDENT_AMBULATORY_CARE_PROVIDER_SITE_OTHER): Payer: 59 | Admitting: Family Medicine

## 2018-10-05 ENCOUNTER — Other Ambulatory Visit: Payer: Self-pay

## 2018-10-05 ENCOUNTER — Encounter: Payer: Self-pay | Admitting: Family Medicine

## 2018-10-05 VITALS — BP 118/78 | Temp 97.8°F | Wt 163.0 lb

## 2018-10-05 DIAGNOSIS — Z0000 Encounter for general adult medical examination without abnormal findings: Secondary | ICD-10-CM

## 2018-10-05 DIAGNOSIS — F325 Major depressive disorder, single episode, in full remission: Secondary | ICD-10-CM | POA: Diagnosis not present

## 2018-10-05 DIAGNOSIS — F419 Anxiety disorder, unspecified: Secondary | ICD-10-CM | POA: Diagnosis not present

## 2018-10-05 DIAGNOSIS — D869 Sarcoidosis, unspecified: Secondary | ICD-10-CM

## 2018-10-05 NOTE — Progress Notes (Signed)
Patient: Barbara Frederick, Female    DOB: [birth year], 48 y.o.   MRN: [id number] Visit Date: 10/05/2018  Today's Provider: Wilhemena Durie, MD   Chief Complaint  Patient presents with  . Annual Exam   Subjective:    Annual physical exam Barbara Frederick is a 48 y.o. female who presents today for health maintenance and complete physical. She feels well. She reports exercising a least 3- 4 times a week. She reports she is sleeping fairly well. She sees Dr Leonides Schanz for Gyn exam. Her left arm is back to normal from pain and weakness which pt thinks was from flu shot last fall. ----------------------------------------------------------------- Last mammogram: 09/07/2018 Last pap: Hysterectomy  Review of Systems  Constitutional: Negative.   HENT: Negative.   Eyes: Negative.   Respiratory: Negative.   Cardiovascular: Negative.   Gastrointestinal: Negative.   Endocrine: Negative.   Genitourinary: Negative.   Musculoskeletal: Negative.   Skin: Negative.   Allergic/Immunologic: Negative.   Neurological: Negative.   Hematological: Negative.   Psychiatric/Behavioral: Negative.     Social History She  reports that she is a non-smoker but has been exposed to tobacco smoke. She has never used smokeless tobacco. She reports current alcohol use. She reports that she does not use drugs. Social History   Socioeconomic History  . Marital status: Married    Spouse name: Not on file  . Number of children: Not on file  . Years of education: Not on file  . Highest education level: Not on file  Occupational History  . Not on file  Social Needs  . Financial resource strain: Not on file  . Food insecurity    Worry: Not on file    Inability: Not on file  . Transportation needs    Medical: Not on file    Non-medical: Not on file  Tobacco Use  . Smoking status: Passive Smoke Exposure - Never Smoker  . Smokeless tobacco: Never Used  . Tobacco comment: Parents smoked around her.    Substance and Sexual Activity  . Alcohol use: Yes    Comment: 3-5/week, wine on the weekend   . Drug use: No  . Sexual activity: Not on file  Lifestyle  . Physical activity    Days per week: Not on file    Minutes per session: Not on file  . Stress: Not on file  Relationships  . Social Herbalist on phone: Not on file    Gets together: Not on file    Attends religious service: Not on file    Active member of club or organization: Not on file    Attends meetings of clubs or organizations: Not on file    Relationship status: Not on file  Other Topics Concern  . Not on file  Social History Narrative   [facility] (06/10/16):   Originally from [facility]. Previously has also lived in [place], [place], [place]. Previously has traveled to [place] a few years ago. She has had prior exposure to elephants who appeared ill. Has also traveled to [place]. Does have indoor dogs & an indoor cat. Brief bird exposure. No mold or hot tub exposure. 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 Enjoys playing tennis & skiing. She also enjoys gardening.     Patient Active Problem List   Diagnosis Date Noted  . Headache 02/21/2017  . Palpitations 06/10/2016  . Raynaud's phenomenon 06/10/2016  . Lymphadenopathy,  mediastinal 02/02/2016  . Cough 01/22/2016  . Anxiety 09/02/2014  . Arthritis 09/02/2014  . Clinical depression 09/02/2014  . Gastro-esophageal reflux disease without esophagitis 09/02/2014  . Insomnia 09/02/2014  . Allergic rhinitis, seasonal 09/02/2014  . Breast pain, left 05/11/2014  . Umbilical hernia 58/52/7782  . Abdominal wall mass of periumbilical region 42/35/3614    Past Surgical History:  Procedure Laterality Date  . ABDOMINAL HYSTERECTOMY  2010   Dr Amalia Hailey  . AUGMENTATION MAMMAPLASTY Bilateral 2001  . BREAST ENHANCEMENT SURGERY Bilateral [facility]  . CYST EXCISION    . ENDOBRONCHIAL ULTRASOUND N/A  02/13/2016   Procedure: ENDOBRONCHIAL ULTRASOUND;  Surgeon: Laverle Hobby, MD;  Location: [facility];  Service: Cardiopulmonary;  Laterality: N/A;  . MEDIASTINOSCOPY N/A 03/29/2016   Procedure: Video MEDIASTINOSCOPY WITH LYMPH NODE BIOPSY;  Surgeon: Grace Isaac, MD;  Location: [facility];  Service: Thoracic;  Laterality: N/A;  . OVARY SURGERY     one ovary and cervix at [facility]  . VAGINAL DELIVERY     twins   . VIDEO BRONCHOSCOPY N/A 03/29/2016   Procedure: VIDEO BRONCHOSCOPY;  Surgeon: Grace Isaac, MD;  Location: [facility] OR;  Service: Thoracic;  Laterality: N/A;    Family History  Family Status  Relation Name Status  . PGM  Deceased       cause of death was Alzhemier's  . Mother  Alive  . Father  Alive  . Sister  Alive  . Brother  Alive  . [employer]  . MGF  Deceased  . PGF  Deceased   Her family history includes Breast cancer in her paternal aunt and paternal grandmother; Cancer (age of onset: 14) in her paternal aunt; Cancer (age of onset: 57) in her paternal grandmother; Colon cancer in her maternal grandfather; Emphysema in her father and paternal grandfather; Hypertension in her brother and mother; Lung cancer in her paternal grandfather; Raynaud syndrome in her maternal grandfather and mother; Rectal cancer in her maternal grandfather.     Allergies  Allergen Reactions  . Codeine Itching    itching    Previous Medications   ALPRAZOLAM (XANAX) 0.5 MG TABLET    Take 1 tablet (0.5 mg total) by mouth 2 (two) times daily as needed for anxiety. Reported on 04/05/2015   BARBERRY-OREG GRAPE-GOLDENSEAL (BERBERINE COMPLEX PO)    Take 500 mg by mouth 3 (three) times daily.   BUPROPION (WELLBUTRIN XL) 300 MG 24 HR TABLET    Take 1 tablet (300 mg total) by mouth daily.   FENTANYL (DURAGESIC) 12 MCG/HR    Place 1 patch (12.5 mcg total) onto the skin every 3 (three) days.   LORATADINE (CLARITIN) 10 MG TABLET    Take 10 mg by mouth daily as needed for allergies.    MOMETASONE  (NASONEX) 50 MCG/ACT NASAL SPRAY    Place 2 sprays into the nose daily as needed (congestion).    NUTRITIONAL SUPPLEMENTS (JUICE PLUS FIBRE PO)    Take by mouth.   TRAMADOL (ULTRAM) 50 MG TABLET    Take 1 tablet (50 mg total) by mouth every 6 (six) hours as needed.    Patient Care Team: Jerrol Banana., MD as PCP - General (Family Medicine) Bary Castilla, Forest Gleason, MD (General Surgery) Patient, No Pcp Per (General Practice) Ward, Honor Loh, MD as Referring Physician (Obstetrics and Gynecology)      Objective:   Vitals: BP 118/78 (BP Location: Right Arm, Patient Position: Sitting, Cuff  Size: Normal)   Temp 97.8 F (36.6 C) (Oral)   Wt 163 lb (73.9 kg)   BMI 25.15 kg/m    Physical Exam Vitals signs reviewed.  Constitutional:      Appearance: She is well-developed.  HENT:     Head: Normocephalic and atraumatic.     Right Ear: External ear normal.     Left Ear: External ear normal.     Nose: Nose normal.  Eyes:     General: No scleral icterus.    Conjunctiva/sclera: Conjunctivae normal.  Neck:     Thyroid: No thyromegaly.  Cardiovascular:     Rate and Rhythm: Normal rate and regular rhythm.     Heart sounds: Normal heart sounds.  Pulmonary:     Effort: Pulmonary effort is normal.     Breath sounds: Normal breath sounds.  Abdominal:     Palpations: Abdomen is soft.  Musculoskeletal:     Right lower leg: No edema.     Left lower leg: No edema.  Skin:    General: Skin is warm and dry.  Neurological:     General: No focal deficit present.     Mental Status: She is alert and oriented to person, place, and time.     Cranial Nerves: No cranial nerve deficit.     Coordination: Coordination normal.     Comments: Grossly nonfocal.  Psychiatric:        Mood and Affect: Mood normal.        Behavior: Behavior normal.        Thought Content: Thought content normal.        Judgment: Judgment normal.      Depression Screen PHQ 2/9 Scores 10/05/2018 09/16/2017 01/22/2016  04/05/2015  PHQ - 2 Score 1 0 3 5  PHQ- 9 Score 4 0 12 18      Assessment & Plan:     Routine Health Maintenance and Physical Exam  Exercise Activities and Dietary recommendations Goals   None     Immunization History  Administered Date(s) Administered  . Influenza Split 01/06/2017  . Influenza-Unspecified 01/07/2015, 01/07/2016    Health Maintenance  Topic Date Due  . HIV Screening  04/13/1983  . TETANUS/TDAP  04/13/1987  . PAP SMEAR-Modifier  04/12/1989  . COLONOSCOPY  04/12/2018  . INFLUENZA VACCINE  10/24/2018  . MAMMOGRAM  09/06/2020     Discussed health benefits of physical activity, and encouraged her to engage in regular exercise appropriate for her age and condition.  1. Encounter for annual physical exam Pt doing well. RTC 1 year.Colonoscopy discussed. - CBC with Differential/Platelet - Comprehensive metabolic panel - Lipid panel - TSH  2. Sarcoidosis 3. Anxiety   4. Major depressive disorder with single episode, in full remission ([place])      -------------------------------------------------------------------- I have done the exam and reviewed the chart and it is accurate to the best of my knowledge. Development worker, community has been used and  any errors in dictation or transcription are unintentional. Miguel Aschoff M.D. [facility]

## 2018-10-09 DIAGNOSIS — Z0000 Encounter for general adult medical examination without abnormal findings: Secondary | ICD-10-CM | POA: Diagnosis not present

## 2018-10-10 LAB — COMPREHENSIVE METABOLIC PANEL
ALT: 28 IU/L (ref 0–32)
AST: 34 IU/L (ref 0–40)
Albumin/Globulin Ratio: 2.4 — ABNORMAL HIGH (ref 1.2–2.2)
Albumin: 4.7 g/dL (ref 3.8–4.8)
Alkaline Phosphatase: 78 IU/L (ref 39–117)
BUN/Creatinine Ratio: 17 (ref 9–23)
BUN: 13 mg/dL (ref 6–24)
Bilirubin Total: 0.3 mg/dL (ref 0.0–1.2)
CO2: 25 mmol/L (ref 20–29)
Calcium: 9.8 mg/dL (ref 8.7–10.2)
Chloride: 96 mmol/L (ref 96–106)
Creatinine, Ser: 0.78 mg/dL (ref 0.57–1.00)
GFR calc Af Amer: 103 mL/min/{1.73_m2} (ref >59)
GFR calc non Af Amer: 89 mL/min/{1.73_m2} (ref >59)
Globulin, Total: 2 g/dL (ref 1.5–4.5)
Glucose: 106 mg/dL — ABNORMAL HIGH (ref 65–99)
Potassium: 4.5 mmol/L (ref 3.5–5.2)
Sodium: 143 mmol/L (ref 134–144)
Total Protein: 6.7 g/dL (ref 6.0–8.5)

## 2018-10-10 LAB — CBC WITH DIFFERENTIAL/PLATELET
Basophils Absolute: 0.1 10*3/uL (ref 0.0–0.2)
Basos: 1 %
EOS (ABSOLUTE): 0.2 10*3/uL (ref 0.0–0.4)
Eos: 4 %
Hematocrit: 41.9 % (ref 34.0–46.6)
Hemoglobin: 14.4 g/dL (ref 11.1–15.9)
Immature Grans (Abs): 0 10*3/uL (ref 0.0–0.1)
Immature Granulocytes: 0 %
Lymphocytes Absolute: 1.2 10*3/uL (ref 0.7–3.1)
Lymphs: 21 %
MCH: 31.3 pg (ref 26.6–33.0)
MCHC: 34.4 g/dL (ref 31.5–35.7)
MCV: 91 fL (ref 79–97)
Monocytes Absolute: 0.8 10*3/uL (ref 0.1–0.9)
Monocytes: 14 %
Neutrophils Absolute: 3.4 10*3/uL (ref 1.4–7.0)
Neutrophils: 60 %
Platelets: 229 10*3/uL (ref 150–450)
RBC: 4.6 x10E6/uL (ref 3.77–5.28)
RDW: 12.4 % (ref 11.7–15.4)
WBC: 5.7 10*3/uL (ref 3.4–10.8)

## 2018-10-10 LAB — LIPID PANEL
Chol/HDL Ratio: 4 ratio (ref 0.0–4.4)
Cholesterol, Total: 237 mg/dL — ABNORMAL HIGH (ref 100–199)
HDL: 59 mg/dL (ref >39)
LDL Calculated: 141 mg/dL — ABNORMAL HIGH (ref 0–99)
Triglycerides: 185 mg/dL — ABNORMAL HIGH (ref 0–149)
VLDL Cholesterol Cal: 37 mg/dL (ref 5–40)

## 2018-10-10 LAB — TSH: TSH: 2.66 u[IU]/mL (ref 0.450–4.500)

## 2018-10-12 ENCOUNTER — Encounter: Payer: Self-pay | Admitting: Family Medicine

## 2018-10-12 ENCOUNTER — Telehealth: Payer: Self-pay

## 2018-10-12 NOTE — Telephone Encounter (Signed)
Patient advised.KW 

## 2018-10-12 NOTE — Telephone Encounter (Signed)
-----   Message from Jerrol Banana., MD sent at 10/12/2018  3:21 PM EDT ----- If fasting then mildly prediabetic--continue to work on habits.

## 2018-10-15 NOTE — Telephone Encounter (Signed)
Pt called on 7/23 wanting to add to her my chart message that she sent in on 7/21 that she has high chol in her family hx and her mom had a blockage in her carotid artery at a very early age.  She just wanted to add this to her message   CB#  [phone]  Con [place]

## 2018-10-15 NOTE — Telephone Encounter (Signed)
Please review. Thanks!  

## 2018-10-23 ENCOUNTER — Telehealth: Payer: 59 | Admitting: Family

## 2018-10-23 DIAGNOSIS — B029 Zoster without complications: Secondary | ICD-10-CM | POA: Diagnosis not present

## 2018-10-23 MED ORDER — VALACYCLOVIR HCL 1 G PO TABS: 1000.0000 mg | ORAL_TABLET | Freq: Three times a day (TID) | ORAL | 0 refills | Status: DC

## 2018-10-23 MED ORDER — GABAPENTIN 300 MG PO CAPS: 300.0000 mg | ORAL_CAPSULE | Freq: Two times a day (BID) | ORAL | 0 refills | Status: DC

## 2018-10-23 NOTE — Progress Notes (Signed)
E-visit for Shingles   We are sorry that you are not feeling well. Here is how we plan to help!  Based on what you shared with me it looks like you have shingles.  Shingles or herpes zoster, is a common infection of the nerves.  It is a painful rash caused by the herpes zoster virus.  This is the same virus that causes chickenpox.  After a person has chickenpox, the virus remains inactive in the nerve cells.  Years later, the virus can become active again and travel to the skin.  It typically will appear on one side of the face or body.  Burning or shooting pain, tingling, or itching are early signs of the infection.  Blisters typically scab over in 7 to 10 days and clear up within 2-4 weeks. Shingles is only contagious to people that have never had the chickenpox, the chickenpox vaccine, or anyone who has a compromised immune system.  You should avoid contact with these type of people until your blisters scab over.  I have prescribed Valacyclovir 1g three times daily for 7 days and also Gabapentin 300mg  twice daily as needed for pain.   A lot of times you can have pain first and then several days later a rash can appear. I do recommend follow up with your PCP to discuss shingles vaccine since this is your second time having it.   Approximately 5 minutes was spent documenting and reviewing patient's chart.     HOME CARE: . Apply ice packs (wrapped in a thin towel), cool compresses, or soak in cool bath to help reduce pain. . Use calamine lotion to calm itchy skin. . Avoid scratching the rash. . Avoid direct sunlight.  GET HELP RIGHT AWAY IF: . Symptoms that don't away after treatment. . A rash or blisters near your eye. . Increased drainage, fever, or rash after treatment. . Severe pain that doesn't go away.   MAKE SURE YOU    Understand these instructions.  Will watch your condition.  Will get help right away if you are not doing well or get worse.  Thank you for choosing an  e-visit. Your e-visit answers were reviewed by a board certified advanced clinical practitioner to complete your personal care plan. Depending upon the condition, your plan could have included both over the counter or prescription medications.  Please review your pharmacy choice. Make sure the pharmacy is open so you can pick up prescription now. If there is a problem, you may contact your provider through [employer] and have the prescription routed to another pharmacy.  Your safety is important to [place]. If you have drug allergies check your prescription carefully.   For the next 24 hours you can use MyChart to ask questions about today's visit, request a non-urgent call back, or ask for a work or school excuse.  You will get an email in the next two days asking about your experience. I hope that your e-visit has been valuable and will speed your recovery

## 2018-12-09 DIAGNOSIS — D509 Iron deficiency anemia, unspecified: Secondary | ICD-10-CM | POA: Diagnosis not present

## 2019-01-05 ENCOUNTER — Telehealth: Payer: Self-pay

## 2019-01-05 ENCOUNTER — Other Ambulatory Visit: Payer: Self-pay

## 2019-01-05 ENCOUNTER — Ambulatory Visit (INDEPENDENT_AMBULATORY_CARE_PROVIDER_SITE_OTHER): Payer: 59 | Admitting: Family Medicine

## 2019-01-05 ENCOUNTER — Ambulatory Visit
Admission: RE | Admit: 2019-01-05 | Discharge: 2019-01-05 | Disposition: A | Discharge status: Home / Self Care | Payer: 59 | Source: Ambulatory Visit | Attending: Family Medicine | Admitting: Family Medicine

## 2019-01-05 DIAGNOSIS — R079 Chest pain, unspecified: Secondary | ICD-10-CM | POA: Diagnosis not present

## 2019-01-05 DIAGNOSIS — R000 Tachycardia, unspecified: Secondary | ICD-10-CM

## 2019-01-05 DIAGNOSIS — R509 Fever, unspecified: Secondary | ICD-10-CM | POA: Insufficient documentation

## 2019-01-05 DIAGNOSIS — R0602 Shortness of breath: Secondary | ICD-10-CM | POA: Diagnosis not present

## 2019-01-05 MED ORDER — IOHEXOL 350 MG/ML SOLN
75.0000 mL | Freq: Once | INTRAVENOUS | Status: AC | PRN
  Administered 2019-01-05: 75 mL via INTRAVENOUS

## 2019-01-05 NOTE — Progress Notes (Signed)
Patient: Barbara Frederick Female    DOB: 1970-12-31   48 y.o.   MRN: [id number] Visit Date: 01/05/2019  Today's Provider: Wilhemena Durie, MD    Subjective:    Virtual Visit via Telephone Note  I connected with Barbara Frederick on 01/05/19 at  3:40 PM EDT by telephone and verified that I am speaking with the correct person using two identifiers.   I discussed the limitations, risks, security and privacy concerns of performing an evaluation and management service by telephone and the availability of in person appointments. I also discussed with the patient that there may be a patient responsible charge related to this service. The patient expressed understanding and agreed to proceed.   History of Present Illness: Patient has had a couple of days of cough and now some shortness of breath and chest pain.  It hurts to take in a deep breath.  No known Covid exposure.  No orthopnea or PND.  No known recent surgery or leg swelling.  No fevers or hemoptysis. She also complains of recent episodes of tachycardia where her heart rate reaches into the 1 50-1 70 range.  It is staying in the 90-1 20 range she says.   Observations/Objective:    Allergies  Allergen Reactions  . Codeine Itching    itching     Current Outpatient Medications:  .  ALPRAZolam (XANAX) 0.5 MG tablet, Take 1 tablet (0.5 mg total) by mouth 2 (two) times daily as needed for anxiety. Reported on 04/05/2015, Disp: 60 tablet, Rfl: 5 .  Barberry-Oreg Grape-Goldenseal (BERBERINE COMPLEX PO), Take 500 mg by mouth 3 (three) times daily., Disp: , Rfl:  .  buPROPion (WELLBUTRIN XL) 300 MG 24 hr tablet, Take 1 tablet (300 mg total) by mouth daily., Disp: 30 tablet, Rfl: 5 .  fentaNYL (DURAGESIC) 12 MCG/HR, Place 1 patch (12.5 mcg total) onto the skin every 3 (three) days. (Patient not taking: Reported on 10/05/2018), Disp: 5 patch, Rfl: 0 .  gabapentin (NEURONTIN) 300 MG capsule, Take 1 capsule (300 mg total) by mouth 2  (two) times daily., Disp: 20 capsule, Rfl: 0 .  loratadine (CLARITIN) 10 MG tablet, Take 10 mg by mouth daily as needed for allergies. , Disp: , Rfl:  .  mometasone (NASONEX) 50 MCG/ACT nasal spray, Place 2 sprays into the nose daily as needed (congestion). , Disp: , Rfl:  .  Nutritional Supplements (JUICE PLUS FIBRE PO), Take by mouth., Disp: , Rfl:  .  traMADol (ULTRAM) 50 MG tablet, Take 1 tablet (50 mg total) by mouth every 6 (six) hours as needed. (Patient not taking: Reported on 10/05/2018), Disp: 60 tablet, Rfl: 0 .  valACYclovir (VALTREX) 1000 MG tablet, Take 1 tablet (1,000 mg total) by mouth 3 (three) times daily., Disp: 21 tablet, Rfl: 0  Review of Systems  Social History   Tobacco Use  . Smoking status: Passive Smoke Exposure - Never Smoker  . Smokeless tobacco: Never Used  . Tobacco comment: Parents smoked around her.   Substance Use Topics  . Alcohol use: Yes    Comment: 3-5/week, wine on the weekend       Objective:   There were no vitals taken for this visit. There were no vitals filed for this visit.There is no height or weight on file to calculate BMI.   Physical Exam   No results found for any visits on 01/05/19.     Assessment & Plan     1.  Chest pain, unspecified type Due to chest pain and, pleuritic pain, and tachycardia will obtain CT to rule out pulmonary embolus. - CT Angio Chest W/Cm &/Or Wo Cm; Future  2. Tachycardia Refer to cardiology. - CT Angio Chest W/Cm &/Or Wo Cm; Future - Ambulatory referral to Cardiology  3. Fever, unspecified fever cause Covid test ordered. - CT Angio Chest W/Cm &/Or Wo Cm; Future  Follow Up Instructions:    I discussed the assessment and treatment plan with the patient. The patient was provided an opportunity to ask questions and all were answered. The patient agreed with the plan and demonstrated an understanding of the instructions.   The patient was advised to call back or seek an in-person evaluation if the  symptoms worsen or if the condition fails to improve as anticipated.  I provided 12 minutes of non-face-to-face time during this encounter.        Richard Cranford Mon, MD  [facility]

## 2019-01-05 NOTE — Telephone Encounter (Addendum)
Patient calling that she would like to talk the provider or to be referred to a Cardiologist. She reports that her HR was 178 yesterday during her sleep and she woke up not able to breath reports that she has been feeling lightheaded. She reports that her hear rate has been elevated for the past 2 days. She also reports that she had a cough, body ache and a temperature yesterday of 100.4 and this morning temperature is 97.6. the cough is better today than it was yesterday.  Went this morning to have test COVID-19 test done. HR: 103 today.  Scheduled patient a telephone call with Dr. Rosanna Randy for today patient requested this but was advised that if the sob worsen or heart rate got elevated and she had the episode of not able to breath to go to [place] [facility].  Patient concern about her heart rate BP yesterday was 180/89.

## 2019-01-06 ENCOUNTER — Encounter: Payer: Self-pay | Admitting: Family Medicine

## 2019-01-08 ENCOUNTER — Ambulatory Visit (INDEPENDENT_AMBULATORY_CARE_PROVIDER_SITE_OTHER): Payer: 59 | Admitting: Cardiology

## 2019-01-08 ENCOUNTER — Other Ambulatory Visit: Payer: Self-pay

## 2019-01-08 ENCOUNTER — Encounter: Payer: Self-pay | Admitting: Cardiology

## 2019-01-08 ENCOUNTER — Ambulatory Visit (INDEPENDENT_AMBULATORY_CARE_PROVIDER_SITE_OTHER): Payer: 59

## 2019-01-08 VITALS — BP 130/92 | HR 102 | Ht 67.5 in | Wt 162.2 lb

## 2019-01-08 DIAGNOSIS — I479 Paroxysmal tachycardia, unspecified: Secondary | ICD-10-CM

## 2019-01-08 NOTE — Patient Instructions (Addendum)
Medication Instructions:   1. Your physician recommends that you continue on your current medications as directed. Please refer to the Current Medication list given to you today.  *If you need a refill on your cardiac medications before your next appointment, please call your pharmacy*  Lab Work:  1. None Ordered  If you have labs (blood work) drawn today and your tests are completely normal, you will receive your results only by: Marland Kitchen MyChart Message (if you have MyChart) OR . A paper copy in the mail If you have any lab test that is abnormal or we need to change your treatment, we will call you to review the results.  Testing/Procedures:  1. A zio monitor was placed today. It will remain on for 14 days. You will then return monitor and event diary in provided box. It takes 1-2 weeks for report to be downloaded and returned to [place]. We will call you with the results. If monitor falls of or has orange flashing light, please call Zio for further instructions.      Follow-Up: At [facility], you and your health needs are our priority.  As part of our continuing mission to provide you with exceptional heart care, we have created designated Provider Care Teams.  These Care Teams include your primary Cardiologist (physician) and Advanced Practice Providers (APPs -  Physician Assistants and Nurse Practitioners) who all work together to provide you with the care you need, when you need it.  Your next appointment:   4-6 weeks  The format for your next appointment:   In Person  Provider:   Kate Sable, MD  Your physician has recommended that you wear a Zio monitor. This monitor is a medical device that records the heart's electrical activity. Doctors most often use these monitors to diagnose arrhythmias. Arrhythmias are problems with the speed or rhythm of the heartbeat. The monitor is a small device applied to your chest. You can wear one while you do your normal daily activities. While  wearing this monitor if you have any symptoms to push the button and record what you felt. Once you have worn this monitor for the period of time provider prescribed (Usually 14 days), you will return the monitor device in the postage paid box. Once it is returned they will download the data collected and provide [place] with a report which the provider will then review and we will call you with those results. Important tips:  1. Avoid showering during the first 24 hours of wearing the monitor. 2. Avoid excessive sweating to help maximize wear time. 3. Do not submerge the device, no hot tubs, and no swimming pools. 4. Keep any lotions or oils away from the patch. 5. After 24 hours you may shower with the patch on. Take brief showers with your back facing the shower head.  6. Do not remove patch once it has been placed because that will interrupt data and decrease adhesive wear time. 7. Push the button when you have any symptoms and write down what you were feeling. 8. Once you have completed wearing your monitor, remove and place into box which has postage paid and place in your outgoing mailbox.  9. If for some reason you have misplaced your box then call our office and we can provide another box and/or mail it off for you.

## 2019-01-08 NOTE — Progress Notes (Signed)
Cardiology Office Note:    Date:  01/08/2019   ID:  Barbara, Frederick 12-27-1970, MRN [id number]  PCP:  Jerrol Banana., MD  Cardiologist:  Kate Sable, MD  Electrophysiologist:  None   Referring MD: Jerrol Banana.,*   Chief Complaint  Patient presents with  . New Patient (Initial Visit)    Tachycardia per PCP    History of Present Illness:    Barbara Frederick is a 48 y.o. female with a hx of anxiety who presents due to elevated heart rates.  Symptoms began about 4 days ago when patient suddenly felt dizzy.  She called a friend who is nurse and told her to check her heart rate and blood pressure.  Her blood pressure systolic was in the 0000000 which is unusual and her heart rate was about 143.  Patient then tried to do some breathing exercises to help with the heart rate and then went to sleep.  She states waking up later, felt dizzy and short of breath, and wobbly on her feet.  She checked her heart rate this time and it was in the 170s.  She called her primary care provider who referred her to see cardiology.  Patient denies ever having these symptoms.  She had her breast implants removed about 10 days ago.  Couple of days after the removal, patient felt shortness of breath and also a low-grade fever of about 100.4.  CT scan was negative for PE but showed atelectasis.  Her provider attributed her fevers to this.  She obtained COVID-19 testing and was negative.  Patient states having high heart rates when she goes about her daily activities such as walking across her home.  This is occurred every day for the past 4 days.  She denies the feeling of palpitations, but endorses feeling dizzy and short of breath.  She smoked in her teens for about 10 years on and off.  Denies any cardiac history.  Past Medical History:  Diagnosis Date  . Allergy   . Anxiety   . Arthritis    L knee- OA  . Complication of anesthesia    needed smaller ETT (anes unable to pass 8.0 or 8.5  ETT 02/13/16; 7.0 ETT used 08/24/07)    Past Surgical History:  Procedure Laterality Date  . ABDOMINAL HYSTERECTOMY  2010   Dr Amalia Hailey  . AUGMENTATION MAMMAPLASTY Bilateral 2001  . BREAST ENHANCEMENT SURGERY Bilateral [facility] [employer]  . CYST EXCISION    . ENDOBRONCHIAL ULTRASOUND N/A 02/13/2016   Procedure: ENDOBRONCHIAL ULTRASOUND;  Surgeon: Laverle Hobby, MD;  Location: [facility] OR[facility];  Service: Cardiopulmonary;  Laterality: N/A;  . MEDIASTINOSCOPY N/A 03/29/2016   Procedure: Video MEDIASTINOSCOPY WITH LYMPH NODE BIOPSY;  Surgeon: Grace Isaac, MD;  Location: [place];  Service: Thoracic;  Laterality: N/A;  . OVARY SURGERY     one ovary [facility] cervix [facility]  . VAGINAL DELIVERY     twins   . VIDEO BRONCHOSCOPY N/A 03/29/2016   Procedure: VIDEO BRONCHOSCOPY;  Surgeon: Grace Isaac, MD;  Location: [facility] OR;  Service: Thoracic;  Laterality: N/A;    Current Medications: Current Meds  Medication Sig  . ALPRAZolam (XANAX) 0.5 MG tablet Take 1 tablet (0.5 mg total) by mouth 2 (two) times daily as needed for anxiety. Reported on 04/05/2015  . Barberry-Oreg Grape-Goldenseal (BERBERINE COMPLEX PO) Take 200 mg by mouth daily.   . ciprofloxacin (CIPRO) 500 MG tablet Take 500 mg  by mouth 2 (two) times daily.  Marland Kitchen loratadine (CLARITIN) 10 MG tablet Take 10 mg by mouth daily as needed for allergies.   . mometasone (NASONEX) 50 MCG/ACT nasal spray Place 2 sprays into the nose daily as needed (congestion).   . Nutritional Supplements (JUICE PLUS FIBRE PO) Take by mouth.     Allergies:   Codeine   Social History   Socioeconomic History  . Marital status: Married    Spouse name: Not on file  . Number of children: Not on file  . Years of education: Not on file  . Highest education level: Not on file  Occupational History  . Not on file  Social Needs  . Financial resource strain: Not on file  . Food insecurity    Worry: Not on file    Inability: Not on file  .  Transportation needs    Medical: Not on file    Non-medical: Not on file  Tobacco Use  . Smoking status: Passive Smoke Exposure - Never Smoker  . Smokeless tobacco: Never Used  . Tobacco comment: Parents smoked around her.   Substance and Sexual Activity  . Alcohol use: Yes    Comment: 3-5/week, wine on the weekend   . Drug use: No  . Sexual activity: Not on file  Lifestyle  . Physical activity    Days per week: Not on file    Minutes per session: Not on file  . Stress: Not on file  Relationships  . Social Herbalist on phone: Not on file    Gets together: Not on file    Attends religious service: Not on file    Active member of club or organization: Not on file    Attends meetings of clubs or organizations: Not on file    Relationship status: Not on file  Other Topics Concern  . Not on file  Social History Narrative   [facility] (06/10/16):   Originally from [facility]. Previously has also lived in [place], [place], [place] Previously has traveled to [place] a few years ago. She has had prior exposure to elephants who appeared ill. Has also traveled to [place]. Does have indoor dogs & an indoor cat. Brief bird exposure. No mold or hot tub exposure. 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 Enjoys playing tennis & skiing. She also enjoys gardening.      Family History: The patient's family history includes Breast cancer in her paternal aunt and paternal grandmother; Cancer (age of onset: 7) in her paternal aunt; Cancer (age of onset: 50) in her paternal grandmother; Colon cancer in her maternal grandfather; Emphysema in her father and paternal grandfather; Hypertension in her brother and mother; Lung cancer in her paternal grandfather; Raynaud syndrome in her maternal grandfather and mother; Rectal cancer in her maternal grandfather.  ROS:   Please see the history of present illness.     All other systems reviewed and are negative.   EKGs/Labs/Other Studies Reviewed:    The following studies were reviewed today: Echocardiogram date 08/29/2016 Study Conclusions  - Procedure narrative: Transthoracic echocardiography. Image   quality was fair. The study was technically difficult. - Left ventricle: The cavity size was normal. Wall thickness was   normal. Systolic function was normal. The estimated ejection   fraction was in the range of 55% to 60%. Wall motion was normal;   there were no regional wall motion abnormalities. Left   ventricular diastolic function  parameters were normal.  EKG:  EKG is  ordered today.  The ekg ordered today demonstrates sinus tachycardia heart rate 102.  Recent Labs: 10/09/2018: ALT 28; BUN 13; Creatinine, Ser 0.78; Hemoglobin 14.4; Platelets 229; Potassium 4.5; Sodium 143; TSH 2.660  Recent Lipid Panel    Component Value Date/Time   CHOL 237 (H) 10/09/2018 0805   TRIG 185 (H) 10/09/2018 0805   HDL 59 10/09/2018 0805   CHOLHDL 4.0 10/09/2018 0805   LDLCALC 141 (H) 10/09/2018 0805    Physical Exam:    VS:  BP (!) 130/92 (BP Location: Right Arm, Patient Position: Sitting, Cuff Size: Normal)   Pulse (!) 102   Ht 5' 7.5" (1.715 m)   Wt 162 lb 4 oz (73.6 kg)   SpO2 97%   BMI 25.04 kg/m     Wt Readings from Last 3 Encounters:  01/08/19 162 lb 4 oz (73.6 kg)  10/05/18 163 lb (73.9 kg)  02/25/18 155 lb (70.3 kg)     GEN:  Well nourished, well developed in no acute distress HEENT: Normal NECK: No JVD; No carotid bruits LYMPHATICS: No lymphadenopathy CARDIAC: RRR, no murmurs, rubs, gallops RESPIRATORY:  Clear to auscultation without rales, wheezing or rhonchi  ABDOMEN: Soft, non-tender, non-distended MUSCULOSKELETAL:  No edema; No deformity  SKIN: Warm and dry NEUROLOGIC:  Alert and oriented x 3 PSYCHIATRIC:  Normal affect   ASSESSMENT:   Vernard Gambles with symptomatic tachycardia.  Current ECG shows sinus tachycardia but cannot say for sure the underlying rhythm when heart rates were  in the 140s to 170s.  Her last echocardiogram 2 years ago did not reveal any structural abnormalities.  1. Tachycardia, paroxysmal (HCC)    PLAN:    In order of problems listed above:  1. Get Zio patch placed x2 weeks.  Will manage patient depending on findings on monitor.  Follow-up in 4 to 6 weeks.  Total encounter time more than 45 minutes  Greater than 50% was spent in counseling and coordination of care with the patient    This note was generated in part or whole with voice recognition software. Voice recognition is usually quite accurate but there are transcription errors that can and very often do occur. I apologize for any typographical errors that were not detected and corrected.  Medication Adjustments/Labs and Tests Ordered: Current medicines are reviewed at length with the patient today.  Concerns regarding medicines are outlined above.  Orders Placed This Encounter  Procedures  . LONG TERM MONITOR (3-14 DAYS)  . EKG 12-Lead   No orders of the defined types were placed in this encounter.   Patient Instructions  Medication Instructions:   1. Your physician recommends that you continue on your current medications as directed. Please refer to the Current Medication list given to you today.  *If you need a refill on your cardiac medications before your next appointment, please call your pharmacy*  Lab Work:  1. None Ordered  If you have labs (blood work) drawn today and your tests are completely normal, you will receive your results only by: Marland Kitchen MyChart Message (if you have MyChart) OR . A paper copy in the mail If you have any lab test that is abnormal or we need to change your treatment, we will call you to review the results.  Testing/Procedures:  1. A zio monitor was placed today. It will remain on for 14 days. You will then return monitor and event diary in provided box. It takes 1-2 weeks for report  to be downloaded and returned to [place]. We will call you with  the results. If monitor falls of or has orange flashing light, please call Zio for further instructions.      Follow-Up: At [facility], you and your health needs are our priority.  As part of our continuing mission to provide you with exceptional heart care, we have created designated Provider Care Teams.  These Care Teams include your primary Cardiologist (physician) and Advanced Practice Providers (APPs -  Physician Assistants and Nurse Practitioners) who all work together to provide you with the care you need, when you need it.  Your next appointment:   4-6 weeks  The format for your next appointment:   In Person  Provider:   Kate Sable, MD  Your physician has recommended that you wear a Zio monitor. This monitor is a medical device that records the heart's electrical activity. Doctors most often use these monitors to diagnose arrhythmias. Arrhythmias are problems with the speed or rhythm of the heartbeat. The monitor is a small device applied to your chest. You can wear one while you do your normal daily activities. While wearing this monitor if you have any symptoms to push the button and record what you felt. Once you have worn this monitor for the period of time provider prescribed (Usually 14 days), you will return the monitor device in the postage paid box. Once it is returned they will download the data collected and provide [place] with a report which the provider will then review and we will call you with those results. Important tips:  1. Avoid showering during the first 24 hours of wearing the monitor. 2. Avoid excessive sweating to help maximize wear time. 3. Do not submerge the device, no hot tubs, and no swimming pools. 4. Keep any lotions or oils away from the patch. 5. After 24 hours you may shower with the patch on. Take brief showers with your back facing the shower head.  6. Do not remove patch once it has been placed because that will interrupt data and decrease  adhesive wear time. 7. Push the button when you have any symptoms and write down what you were feeling. 8. Once you have completed wearing your monitor, remove and place into box which has postage paid and place in your outgoing mailbox.  9. If for some reason you have misplaced your box then call our office and we can provide another box and/or mail it off for you.          Signed, Kate Sable, MD  01/08/2019 8:42 AM    [facility]

## 2019-01-11 ENCOUNTER — Ambulatory Visit: Payer: 59 | Admitting: Cardiology

## 2019-01-30 DIAGNOSIS — R000 Tachycardia, unspecified: Secondary | ICD-10-CM | POA: Diagnosis not present

## 2019-02-05 ENCOUNTER — Ambulatory Visit (INDEPENDENT_AMBULATORY_CARE_PROVIDER_SITE_OTHER): Payer: 59 | Admitting: Cardiology

## 2019-02-05 ENCOUNTER — Other Ambulatory Visit: Payer: Self-pay

## 2019-02-05 ENCOUNTER — Encounter: Payer: Self-pay | Admitting: Cardiology

## 2019-02-05 VITALS — BP 122/90 | HR 78 | Temp 96.6°F | Ht 67.5 in | Wt 162.2 lb

## 2019-02-05 DIAGNOSIS — I479 Paroxysmal tachycardia, unspecified: Secondary | ICD-10-CM

## 2019-02-05 NOTE — Patient Instructions (Signed)
Medication Instructions:  Your physician recommends that you continue on your current medications as directed. Please refer to the Current Medication list given to you today.  *If you need a refill on your cardiac medications before your next appointment, please call your pharmacy*  Lab Work: None ordered If you have labs (blood work) drawn today and your tests are completely normal, you will receive your results only by: . MyChart Message (if you have MyChart) OR . A paper copy in the mail If you have any lab test that is abnormal or we need to change your treatment, we will call you to review the results.  Testing/Procedures: None ordered  Follow-Up: At [facility], you and your health needs are our priority.  As part of our continuing mission to provide you with exceptional heart care, we have created designated Provider Care Teams.  These Care Teams include your primary Cardiologist (physician) and Advanced Practice Providers (APPs -  Physician Assistants and Nurse Practitioners) who all work together to provide you with the care you need, when you need it.  Your next appointment:   As needed   The format for your next appointment:   In Person  Provider:    You may see Brian Agbor-Etang, MD or one of the following Advanced Practice Providers on your designated Care Team:    Christopher Berge, NP  Ryan Dunn, PA-C  Jacquelyn Visser, PA-C   Other Instructions N/A  

## 2019-02-05 NOTE — Progress Notes (Addendum)
Cardiology Office Note:    Date:  02/05/2019   ID:  Barbara Frederick, Barbara Frederick [birth year], MRN [id number]  PCP:  Jerrol Banana., MD  Cardiologist:  Kate Sable, MD  Electrophysiologist:  None   Referring MD: Jerrol Banana.,*   Chief Complaint  Patient presents with  . office visit    4 week F/U after wearing ZIO    History of Present Illness:    Barbara Frederick is a 48 y.o. female with a hx of anxiety who presents for follow-up.  She was originally seen due to elevated heart rates.    At the time, patient complained of a 4-day history of dizziness and elevated heart rate up to 170s beats per minute.  A 2-week cardiac monitor was placed and patient now presents for results.  Patient states she has been doing well, has not had any further symptoms of palpitations.   Past Medical History:  Diagnosis Date  . Allergy   . Anxiety   . Arthritis    L knee- OA  . Complication of anesthesia    needed smaller ETT (anes unable to pass 8.0 or 8.5 ETT 02/13/16; 7.0 ETT used 08/24/07)    Past Surgical History:  Procedure Laterality Date  . ABDOMINAL HYSTERECTOMY  2010   Dr Amalia Hailey  . AUGMENTATION MAMMAPLASTY Bilateral 2001  . BREAST ENHANCEMENT SURGERY Bilateral [employer]  . CYST EXCISION    . ENDOBRONCHIAL ULTRASOUND N/A 02/13/2016   Procedure: ENDOBRONCHIAL ULTRASOUND;  Surgeon: Laverle Hobby, MD;  Location: [facility] ORS;  Service: Cardiopulmonary;  Laterality: N/A;  . MEDIASTINOSCOPY N/A 03/29/2016   Procedure: Video MEDIASTINOSCOPY WITH LYMPH NODE BIOPSY;  Surgeon: Grace Isaac, MD;  Location: [facility];  Service: Thoracic;  Laterality: N/A;  . OVARY SURGERY     one ovary and cervix at [facility]  . VAGINAL DELIVERY     twins   . VIDEO BRONCHOSCOPY N/A 03/29/2016   Procedure: VIDEO BRONCHOSCOPY;  Surgeon: Grace Isaac, MD;  Location: [facility] OR;  Service: Thoracic;  Laterality: N/A;    Current Medications: Current Meds  Medication  Sig  . ALPRAZolam (XANAX) 0.5 MG tablet Take 1 tablet (0.5 mg total) by mouth 2 (two) times daily as needed for anxiety. Reported on 04/05/2015  . Barberry-Oreg Grape-Goldenseal (BERBERINE COMPLEX PO) Take 200 mg by mouth daily.   Marland Kitchen loratadine (CLARITIN) 10 MG tablet Take 10 mg by mouth daily as needed for allergies.   . mometasone (NASONEX) 50 MCG/ACT nasal spray Place 2 sprays into the nose daily as needed (congestion).   . Nutritional Supplements (JUICE PLUS FIBRE PO) Take by mouth.     Allergies:   Codeine   Social History   Socioeconomic History  . Marital status: Married    Spouse name: Not on file  . Number of children: Not on file  . Years of education: Not on file  . Highest education level: Not on file  Occupational History  . Not on file  Social Needs  . Financial resource strain: Not on file  . Food insecurity    Worry: Not on file    Inability: Not on file  . Transportation needs    Medical: Not on file    Non-medical: Not on file  Tobacco Use  . Smoking status: Passive Smoke Exposure - Never Smoker  . Smokeless tobacco: Never Used  . Tobacco comment: Parents smoked around her.   Substance and Sexual Activity  .  Alcohol use: Yes    Comment: 3-5/week, wine on the weekend   . Drug use: No  . Sexual activity: Not on file  Lifestyle  . Physical activity    Days per week: Not on file    Minutes per session: Not on file  . Stress: Not on file  Relationships  . Social Herbalist on phone: Not on file    Gets together: Not on file    Attends religious service: Not on file    Active member of club or organization: Not on file    Attends meetings of clubs or organizations: Not on file    Relationship status: Not on file  Other Topics Concern  . Not on file  Social History Narrative   [facility] (06/10/16):   Originally from [place]. Previously has also lived in [place], [place], [place]. Previously has traveled to [place] a few years ago. She has had  prior exposure to elephants who appeared ill. Has also traveled to [place]. Does have indoor dogs & an indoor cat. Brief bird exposure. No mold or hot tub exposure. 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 Enjoys playing tennis & skiing. She also enjoys gardening.      Family History: The patient's family history includes Breast cancer in her paternal aunt and paternal grandmother; Cancer (age of onset: 41) in her paternal aunt; Cancer (age of onset: 24) in her paternal grandmother; Colon cancer in her maternal grandfather; Emphysema in her father and paternal grandfather; Hypertension in her brother and mother; Lung cancer in her paternal grandfather; Raynaud syndrome in her maternal grandfather and mother; Rectal cancer in her maternal grandfather.  ROS:   Please see the history of present illness.     All other systems reviewed and are negative.  EKGs/Labs/Other Studies Reviewed:    The following studies were reviewed today:  2-week cardiac monitor date 01/08/2019  Patient had a min HR of 50 bpm, max HR of 183 bpm, and avg HR of 82 bpm. Predominant underlying rhythm was Sinus Rhythm. 1 Supraventricular Tachycardia run occurred, the run interval lasting 4 beats with a max rate of 150 bpm. Isolated SVEs were rare (<1.0%), SVE Couplets were rare (<1.0%), and SVE Triplets were rare (<1.0%). Isolated VEs were rare (<1.0%), VE Couplets were rare (<1.0%), and no VE Triplets were present.  Patient triggered events were associated with sinus rhythm  Echocardiogram date 08/29/2016 Study Conclusions  - Procedure narrative: Transthoracic echocardiography. Image   quality was fair. The study was technically difficult. - Left ventricle: The cavity size was normal. Wall thickness was   normal. Systolic function was normal. The estimated ejection   fraction was in the range of 55% to 60%. Wall motion was normal;   there were no regional wall motion  abnormalities. Left   ventricular diastolic function parameters were normal.  EKG:  EKG is  ordered today.  The ekg ordered today demonstrates normal sinus rhythm, normal ECG.  Recent Labs: 10/09/2018: ALT 28; BUN 13; Creatinine, Ser 0.78; Hemoglobin 14.4; Platelets 229; Potassium 4.5; Sodium 143; TSH 2.660  Recent Lipid Panel    Component Value Date/Time   CHOL 237 (H) 10/09/2018 0805   TRIG 185 (H) 10/09/2018 0805   HDL 59 10/09/2018 0805   CHOLHDL 4.0 10/09/2018 0805   LDLCALC 141 (H) 10/09/2018 0805    Physical Exam:    VS:  BP 122/90 (BP Location: Left Arm, Patient Position: Sitting,  Cuff Size: Normal)   Pulse 78   Temp (!) 96.6 F (35.9 C)   Ht 5' 7.5" (1.715 m)   Wt 162 lb 4 oz (73.6 kg)   SpO2 98%   BMI 25.04 kg/m     Wt Readings from Last 3 Encounters:  02/05/19 162 lb 4 oz (73.6 kg)  01/08/19 162 lb 4 oz (73.6 kg)  10/05/18 163 lb (73.9 kg)     GEN:  Well nourished, well developed in no acute distress HEENT: Normal NECK: No JVD; No carotid bruits LYMPHATICS: No lymphadenopathy CARDIAC: RRR, no murmurs, rubs, gallops RESPIRATORY:  Clear to auscultation without rales, wheezing or rhonchi  ABDOMEN: Soft, non-tender, non-distended MUSCULOSKELETAL:  No edema; No deformity  SKIN: Warm and dry NEUROLOGIC:  Alert and oriented x 3 PSYCHIATRIC:  Normal affect   ASSESSMENT:   Patient with history of palpitations and elevated heart rate.  2-week event monitor did not reveal any significant arrhythmias.  She had a 1 SVT run of 4 beats maximum heart rate about 150.  All patient triggered events were associated with sinus rhythm.  Her last echocardiogram 2 years ago did not reveal any structural abnormalities.  1. Tachycardia, paroxysmal (HCC)    PLAN:    In order of problems listed above:  No significant arrhythmias to explain patient's symptoms.  Last echocardiogram did not reveal any significant structural abnormalities.  Patient reassured.  Total encounter time  more than 25 minutes  Greater than 50% was spent in counseling and coordination of care with the patient   This note was generated in part or whole with voice recognition software. Voice recognition is usually quite accurate but there are transcription errors that can and very often do occur. I apologize for any typographical errors that were not detected and corrected.  Medication Adjustments/Labs and Tests Ordered: Current medicines are reviewed at length with the patient today.  Concerns regarding medicines are outlined above.  Orders Placed This Encounter  Procedures  . EKG 12-Lead   No orders of the defined types were placed in this encounter.   Patient Instructions  Medication Instructions:  Your physician recommends that you continue on your current medications as directed. Please refer to the Current Medication list given to you today.  *If you need a refill on your cardiac medications before your next appointment, please call your pharmacy*  Lab Work: None ordered If you have labs (blood work) drawn today and your tests are completely normal, you will receive your results only by: Marland Kitchen MyChart Message (if you have MyChart) OR . A paper copy in the mail If you have any lab test that is abnormal or we need to change your treatment, we will call you to review the results.  Testing/Procedures: None ordered  Follow-Up: At [facility], you and your health needs are our priority.  As part of our continuing mission to provide you with exceptional heart care, we have created designated Provider Care Teams.  These Care Teams include your primary Cardiologist (physician) and Advanced Practice Providers (APPs -  Physician Assistants and Nurse Practitioners) who all work together to provide you with the care you need, when you need it.  Your next appointment:   As needed  The format for your next appointment:   In Person  Provider:    You may see Kate Sable, MD or one of  the following Advanced Practice Providers on your designated Care Team:    Murray Hodgkins, NP  Christell Faith, PA-C  Marrianne Mood, PA-C   Other Instructions N/A     Signed, Kate Sable, MD  02/05/2019 8:25 AM    [place]

## 2019-04-19 ENCOUNTER — Encounter: Payer: Self-pay | Admitting: Family Medicine

## 2019-04-20 NOTE — Progress Notes (Deleted)
       Patient: Barbara Frederick Female    DOB: 1970/09/12   49 y.o.   MRN: [id number] Visit Date: 04/20/2019  Today's Provider: Wilhemena Durie, MD   No chief complaint on file.  Subjective:     HPI  Allergies  Allergen Reactions  . Codeine Itching    itching     Current Outpatient Medications:  .  ALPRAZolam (XANAX) 0.5 MG tablet, Take 1 tablet (0.5 mg total) by mouth 2 (two) times daily as needed for anxiety. Reported on 04/05/2015, Disp: 60 tablet, Rfl: 5 .  Barberry-Oreg Grape-Goldenseal (BERBERINE COMPLEX PO), Take 200 mg by mouth daily. , Disp: , Rfl:  .  ciprofloxacin (CIPRO) 500 MG tablet, Take 500 mg by mouth 2 (two) times daily., Disp: , Rfl:  .  fentaNYL (DURAGESIC) 12 MCG/HR, Place 1 patch (12.5 mcg total) onto the skin every 3 (three) days. (Patient not taking: Reported on 10/05/2018), Disp: 5 patch, Rfl: 0 .  gabapentin (NEURONTIN) 300 MG capsule, Take 1 capsule (300 mg total) by mouth 2 (two) times daily. (Patient not taking: Reported on 01/08/2019), Disp: 20 capsule, Rfl: 0 .  loratadine (CLARITIN) 10 MG tablet, Take 10 mg by mouth daily as needed for allergies. , Disp: , Rfl:  .  mometasone (NASONEX) 50 MCG/ACT nasal spray, Place 2 sprays into the nose daily as needed (congestion). , Disp: , Rfl:  .  Nutritional Supplements (JUICE PLUS FIBRE PO), Take by mouth., Disp: , Rfl:  .  traMADol (ULTRAM) 50 MG tablet, Take 1 tablet (50 mg total) by mouth every 6 (six) hours as needed. (Patient not taking: Reported on 10/05/2018), Disp: 60 tablet, Rfl: 0 .  valACYclovir (VALTREX) 1000 MG tablet, Take 1 tablet (1,000 mg total) by mouth 3 (three) times daily. (Patient not taking: Reported on 01/08/2019), Disp: 21 tablet, Rfl: 0  Review of Systems  Constitutional: Negative for appetite change, chills, fatigue and fever.  Respiratory: Negative for chest tightness and shortness of breath.   Cardiovascular: Negative for chest pain and palpitations.  Gastrointestinal: Negative  for abdominal pain, nausea and vomiting.  Neurological: Negative for dizziness and weakness.    Social History   Tobacco Use  . Smoking status: Passive Smoke Exposure - Never Smoker  . Smokeless tobacco: Never Used  . Tobacco comment: Parents smoked around her.   Substance Use Topics  . Alcohol use: Yes    Comment: 3-5/week, wine on the weekend       Objective:   There were no vitals taken for this visit. There were no vitals filed for this visit.There is no height or weight on file to calculate BMI.   Physical Exam   No results found for any visits on 04/21/19.     Assessment & Plan        Wilhemena Durie, MD  [facility]

## 2019-04-21 ENCOUNTER — Ambulatory Visit: Payer: Self-pay | Admitting: Family Medicine

## 2019-06-11 ENCOUNTER — Ambulatory Visit: Payer: 59 | Attending: Internal Medicine

## 2019-06-11 DIAGNOSIS — Z23 Encounter for immunization: Secondary | ICD-10-CM

## 2019-06-11 NOTE — Progress Notes (Signed)
   Covid-19 Vaccination Clinic  Name:  Barbara Frederick    MRN: [id number] DOB: [birth year]  06/11/2019  Barbara Frederick was observed post Covid-19 immunization for 30 minutes based on pre-vaccination screening without incident. She was provided with Vaccine Information Sheet and instruction to access the V-Safe system.   Barbara Frederick was instructed to call 911 with any severe reactions post vaccine: Marland Kitchen Difficulty breathing  . Swelling of face and throat  . A fast heartbeat  . A bad rash all over body  . Dizziness and weakness   Immunizations Administered    Name Date Dose VIS Date Route   Pfizer COVID-19 Vaccine 06/11/2019 10:54 AM 0.3 mL 03/05/2019 Intramuscular   Manufacturer: [place]   Lot: [id number]   [place]: [id number]

## 2019-06-11 NOTE — Progress Notes (Signed)
   Covid-19 Vaccination Clinic  Name:  Barbara Frederick    MRN: [id number] DOB: February 02, 1971  06/11/2019  Ms. Schmiesing was observed post Covid-19 immunization for 15 minutes without incident. She was provided with Vaccine Information Sheet and instruction to access the V-Safe system.   Ms. Berkland was instructed to call 911 with any severe reactions post vaccine: Marland Kitchen Difficulty breathing  . Swelling of face and throat  . A fast heartbeat  . A bad rash all over body  . Dizziness and weakness   Immunizations Administered    Name Date Dose VIS Date Route   Pfizer COVID-19 Vaccine 06/11/2019 10:54 AM 0.3 mL 03/05/2019 Intramuscular   Manufacturer: [place]   Lot: [id number]   [place]: [id number]

## 2019-06-15 NOTE — Progress Notes (Signed)
Patient: Barbara Frederick Female    DOB: 08-Jul-1970   49 y.o.   MRN: [id number] Visit Date: 06/16/2019  Today's Provider: Wilhemena Durie, MD   Chief Complaint  Patient presents with  . Medication Refill  . Referral   Subjective:     HPI  Overall patient is doing well but is having a lot of work stress.  She has been mildly anxious and depressed recently.  She has recently been back on her Wellbutrin 150 XL.  She is seeing a Network engineer.  She is not suicidal or homicidal. Patient presents today for medication refill and for a referral to GI for Colonoscopy.   Clinical Depression From 10/05/2018-on alprazolam 0.5 bid prn.  Anxiety From 10/05/2018-on alprazolam 0.5 bid prn.   Allergies  Allergen Reactions  . Codeine Itching    itching     Current Outpatient Medications:  .  ALPRAZolam (XANAX) 0.5 MG tablet, Take 1 tablet (0.5 mg total) by mouth 2 (two) times daily as needed for anxiety. Reported on 04/05/2015, Disp: 60 tablet, Rfl: 5 .  Barberry-Oreg Grape-Goldenseal (BERBERINE COMPLEX PO), Take 200 mg by mouth daily. , Disp: , Rfl:  .  ciprofloxacin (CIPRO) 500 MG tablet, Take 500 mg by mouth 2 (two) times daily., Disp: , Rfl:  .  fentaNYL (DURAGESIC) 12 MCG/HR, Place 1 patch (12.5 mcg total) onto the skin every 3 (three) days. (Patient not taking: Reported on 10/05/2018), Disp: 5 patch, Rfl: 0 .  gabapentin (NEURONTIN) 300 MG capsule, Take 1 capsule (300 mg total) by mouth 2 (two) times daily. (Patient not taking: Reported on 01/08/2019), Disp: 20 capsule, Rfl: 0 .  loratadine (CLARITIN) 10 MG tablet, Take 10 mg by mouth daily as needed for allergies. , Disp: , Rfl:  .  mometasone (NASONEX) 50 MCG/ACT nasal spray, Place 2 sprays into the nose daily as needed (congestion). , Disp: , Rfl:  .  Nutritional Supplements (JUICE PLUS FIBRE PO), Take by mouth., Disp: , Rfl:  .  traMADol (ULTRAM) 50 MG tablet, Take 1 tablet (50 mg total) by mouth every 6 (six) hours as  needed. (Patient not taking: Reported on 10/05/2018), Disp: 60 tablet, Rfl: 0 .  valACYclovir (VALTREX) 1000 MG tablet, Take 1 tablet (1,000 mg total) by mouth 3 (three) times daily. (Patient not taking: Reported on 01/08/2019), Disp: 21 tablet, Rfl: 0  Review of Systems  Constitutional: Negative.   HENT: Negative.   Respiratory: Negative.   Cardiovascular: Negative.   Gastrointestinal: Negative.   Allergic/Immunologic: Negative.   Neurological: Negative.   Hematological: Negative.   Psychiatric/Behavioral: Negative.     Social History   Tobacco Use  . Smoking status: Passive Smoke Exposure - Never Smoker  . Smokeless tobacco: Never Used  . Tobacco comment: Parents smoked around her.   Substance Use Topics  . Alcohol use: Yes    Comment: 3-5/week, wine on the weekend       Objective:   There were no vitals taken for this visit. There were no vitals filed for this visit.There is no height or weight on file to calculate BMI.   Physical Exam Vitals reviewed.  HENT:     Head: Normocephalic and atraumatic.  Eyes:     General: No scleral icterus. Pulmonary:     Effort: Pulmonary effort is normal.  Neurological:     Mental Status: She is alert and oriented to person, place, and time.  Psychiatric:  Mood and Affect: Mood normal.        Behavior: Behavior normal.        Thought Content: Thought content normal.        Judgment: Judgment normal.      No results found for any visits on 06/16/19.     Assessment & Plan       1. Anxiety Increase Wellbutrin to 300 mg and return to clinic in 1 to 2 months.  Continue to see life coach. - buPROPion (WELLBUTRIN XL) 300 MG 24 hr tablet; Take 1 tablet (300 mg total) by mouth daily.  Dispense: 30 tablet; Refill: 12  2. Depression, major, single episode, mild (HCC) Mild reactive depression.  Plan as above. - buPROPion (WELLBUTRIN XL) 300 MG 24 hr tablet; Take 1 tablet (300 mg total) by mouth daily.  Dispense: 30 tablet;  Refill: 12  3. Screening for colon cancer This was deferred last year due to Covid pandemic.  Refer back to GI. - Ambulatory referral to Gastroenterology   Wilhemena Durie, MD  [facility]

## 2019-06-16 ENCOUNTER — Encounter: Payer: Self-pay | Admitting: Family Medicine

## 2019-06-16 ENCOUNTER — Other Ambulatory Visit: Payer: Self-pay

## 2019-06-16 ENCOUNTER — Ambulatory Visit (INDEPENDENT_AMBULATORY_CARE_PROVIDER_SITE_OTHER): Payer: 59 | Admitting: Family Medicine

## 2019-06-16 VITALS — BP 110/74 | HR 82 | Temp 96.9°F | Ht 68.0 in | Wt 162.0 lb

## 2019-06-16 DIAGNOSIS — F419 Anxiety disorder, unspecified: Secondary | ICD-10-CM

## 2019-06-16 DIAGNOSIS — Z1211 Encounter for screening for malignant neoplasm of colon: Secondary | ICD-10-CM | POA: Diagnosis not present

## 2019-06-16 DIAGNOSIS — F320 Major depressive disorder, single episode, mild: Secondary | ICD-10-CM

## 2019-06-16 MED ORDER — BUPROPION HCL ER (XL) 300 MG PO TB24: 300.0000 mg | ORAL_TABLET | Freq: Every day | ORAL | 12 refills | Status: AC

## 2019-06-22 ENCOUNTER — Other Ambulatory Visit: Payer: Self-pay

## 2019-06-23 ENCOUNTER — Ambulatory Visit (INDEPENDENT_AMBULATORY_CARE_PROVIDER_SITE_OTHER): Payer: Self-pay | Admitting: Gastroenterology

## 2019-06-23 VITALS — Ht 68.0 in | Wt 162.0 lb

## 2019-06-23 DIAGNOSIS — Z1211 Encounter for screening for malignant neoplasm of colon: Secondary | ICD-10-CM

## 2019-06-23 MED ORDER — NA SULFATE-K SULFATE-MG SULF 17.5-3.13-1.6 GM/177ML PO SOLN: 1.0000 | Freq: Once | ORAL | 0 refills | Status: AC

## 2019-06-23 NOTE — Patient Instructions (Signed)
Colonoscopy has been scheduled with Dr. Sherri Sear on [facility] 07/21/19.  Please call the Endoscopy Dept at 1pm.  [phone] for arrival time.  COVID Test should be completed on Monday 07/19/19 at [place] on [facility] arrive between 8am-1pm for drive up testing.  Please review your detailed colonoscopy instructions in your mychart under the letters tab.  SuPrep Bowel Prep has been sent to [facility].  If you have any questions, please call the office.  Have a great day!  Sharyn Lull, [facility] [phone]

## 2019-06-23 NOTE — Progress Notes (Signed)
Gastroenterology Pre-Procedure Review  Request Date: 07/21/19 Requesting Physician: Dr. Marius Ditch  PATIENT REVIEW QUESTIONS: The patient responded to the following health history questions as indicated:    1. Are you having any GI issues? no 2. Do you have a personal history of Polyps? no 3. Do you have a family history of Colon Cancer or Polyps? yes (maternal grandfather colon cancer) 4. Diabetes Mellitus? no 5. Joint replacements in the past 12 months?no 6. Major health problems in the past 3 months?no 7. Any artificial heart valves, MVP, or defibrillator?no    MEDICATIONS & ALLERGIES:    Patient reports the following regarding taking any anticoagulation/antiplatelet therapy:   Plavix, Coumadin, Eliquis, Xarelto, Lovenox, Pradaxa, Brilinta, or Effient? no Aspirin? no  Patient confirms/reports the following medications:  Current Outpatient Medications  Medication Sig Dispense Refill  . ALPRAZolam (XANAX) 0.5 MG tablet Take 1 tablet (0.5 mg total) by mouth 2 (two) times daily as needed for anxiety. Reported on 04/05/2015 60 tablet 5  . Barberry-Oreg Grape-Goldenseal (BERBERINE COMPLEX PO) Take 200 mg by mouth daily.     Marland Kitchen buPROPion (WELLBUTRIN XL) 300 MG 24 hr tablet Take 1 tablet (300 mg total) by mouth daily. 30 tablet 12  . loratadine (CLARITIN) 10 MG tablet Take 10 mg by mouth daily as needed for allergies.     . mometasone (NASONEX) 50 MCG/ACT nasal spray Place 2 sprays into the nose daily as needed (congestion).     . Nutritional Supplements (JUICE PLUS FIBRE PO) Take by mouth.    . gabapentin (NEURONTIN) 300 MG capsule Take 1 capsule (300 mg total) by mouth 2 (two) times daily. (Patient not taking: Reported on 01/08/2019) 20 capsule 0  . Na Sulfate-K Sulfate-Mg Sulf 17.5-3.13-1.6 GM/177ML SOLN Take 1 kit by mouth once for 1 dose. 354 mL 0   No current facility-administered medications for this visit.    Patient confirms/reports the following allergies:  Allergies  Allergen  Reactions  . Codeine Itching    itching    No orders of the defined types were placed in this encounter.   AUTHORIZATION INFORMATION Primary Insurance: 1D#: Group #:  Secondary Insurance: 1D#: Group #:  SCHEDULE INFORMATION: Date: 07/21/19 Time: Location:[facility]

## 2019-07-07 ENCOUNTER — Ambulatory Visit: Payer: 59 | Attending: Internal Medicine

## 2019-07-07 DIAGNOSIS — Z23 Encounter for immunization: Secondary | ICD-10-CM

## 2019-07-07 NOTE — Progress Notes (Signed)
   Covid-19 Vaccination Clinic  Name:  Barbara Frederick    MRN: [id number] DOB: [birth year]  07/07/2019  Barbara Frederick was observed post Covid-19 immunization for 15 minutes without incident. She was provided with Vaccine Information Sheet and instruction to access the V-Safe system.   Barbara Frederick was instructed to call 911 with any severe reactions post vaccine: Marland Kitchen Difficulty breathing  . Swelling of face and throat  . A fast heartbeat  . A bad rash all over body  . Dizziness and weakness   Immunizations Administered    Name Date Dose VIS Date Route   Pfizer COVID-19 Vaccine 07/07/2019  8:49 AM 0.3 mL 03/05/2019 Intramuscular   Manufacturer: [place]   Lot: [id number]   [place]: [id number]

## 2019-07-09 ENCOUNTER — Telehealth: Payer: 59 | Admitting: Nurse Practitioner

## 2019-07-09 DIAGNOSIS — N390 Urinary tract infection, site not specified: Secondary | ICD-10-CM | POA: Diagnosis not present

## 2019-07-09 MED ORDER — CEPHALEXIN 500 MG PO CAPS: 500.0000 mg | ORAL_CAPSULE | Freq: Two times a day (BID) | ORAL | 0 refills | Status: AC

## 2019-07-09 MED ORDER — PHENAZOPYRIDINE HCL 100 MG PO TABS: 100.0000 mg | ORAL_TABLET | Freq: Three times a day (TID) | ORAL | 0 refills | Status: AC | PRN

## 2019-07-09 NOTE — Progress Notes (Signed)
We are sorry that you are not feeling well.  Here is how we plan to help!  Based on what you shared with me it looks like you most likely have a simple urinary tract infection.  A UTI (Urinary Tract Infection) is a bacterial infection of the bladder.  Most cases of urinary tract infections are simple to treat but a key part of your care is to encourage you to drink plenty of fluids and watch your symptoms carefully.  I have prescribed Keflex 500 mg twice a day for 7 days.  I am also prescribing Pyridium 100 mg tablets to take up to 3 times daily for 3 days for urinary discomfort.  Please be advised this medication will change the color of your urine to bright orange.  Your symptoms should gradually improve. Call us if the burning in your urine worsens, you develop worsening fever, back pain or pelvic pain or if your symptoms do not resolve after completing the antibiotic.  Urinary tract infections can be prevented by drinking plenty of water to keep your body hydrated.  Also be sure when you wipe, wipe from front to back and don't hold it in!  If possible, empty your bladder every 4 hours.  Your e-visit answers were reviewed by a board certified advanced clinical practitioner to complete your personal care plan.  Depending on the condition, your plan could have included both over the counter or prescription medications.  If there is a problem please reply  once you have received a response from your provider.  Your safety is important to [place].  If you have drug allergies check your prescription carefully.    You can use MyChart to ask questions about today's visit, request a non-urgent call back, or ask for a work or school excuse for 24 hours related to this e-Visit. If it has been greater than 24 hours you will need to follow up with your provider, or enter a new e-Visit to address those concerns.   You will get an e-mail in the next two days asking about your experience.  I hope that your  e-visit has been valuable and will speed your recovery. Thank you for using e-visits.  I have spent at least 5 minutes reviewing and documenting in the patient's chart.

## 2019-07-13 ENCOUNTER — Telehealth: Payer: Self-pay | Admitting: Gastroenterology

## 2019-07-13 NOTE — Telephone Encounter (Signed)
Patient called &l/m on v/m  would like information about her procedure on 07-21-2019 either sent to My Chart or emailed to her.

## 2019-07-13 NOTE — Telephone Encounter (Signed)
Returned patients call regarding colonoscopy instructions.  LVM to make her aware that her instructions can be found under the letters tab in her mychart.  The letter is dated 06/23/19.  Bowel Prep was sent to [facility] on 06/23/19.  Thanks,  [place], [place]

## 2019-07-19 ENCOUNTER — Other Ambulatory Visit: Payer: 59

## 2019-07-22 ENCOUNTER — Ambulatory Visit: Admission: RE | Admit: 2019-07-22 | Payer: 59 | Source: Home / Self Care | Admitting: Gastroenterology

## 2019-07-22 ENCOUNTER — Encounter: Admission: RE | Payer: Self-pay | Source: Home / Self Care

## 2019-07-22 SURGERY — COLONOSCOPY WITH PROPOFOL
Anesthesia: General

## 2019-07-26 ENCOUNTER — Encounter: Payer: Self-pay | Admitting: Family Medicine

## 2019-07-29 NOTE — Progress Notes (Deleted)
Established patient visit   Patient: Barbara Frederick   DOB: [birth year]   49 y.o. Female  MRN: [id number] Visit Date: 08/04/2019  I,Upton Russey S Briea Mcenery,acting as a scribe for Wilhemena Durie, MD.,have documented all relevant documentation on the behalf of Wilhemena Durie, MD,as directed by  Wilhemena Durie, MD while in the presence of Wilhemena Durie, MD.  Today's healthcare provider: Wilhemena Durie, MD   No chief complaint on file.  Subjective    HPI  Depression, Follow-up  She  was last seen for this 7 weeks ago. Changes made at last visit include increase wellbutrin 300mg  daily.   She reports {excellent/good/fair/poor:19665} compliance with treatment. She {is/is not:21021397} having side effects. ***  She reports {DESC; GOOD/FAIR/POOR:18685} tolerance of treatment. Current symptoms include: {Symptoms; depression:1002} She feels she is {improved/worse/unchanged:3041574} since last visit.  Depression screen [facility] 2/9 06/16/2019 10/05/2018 09/16/2017 01/22/2016 04/05/2015  Decreased Interest 1 0 0 2 2  Down, Depressed, Hopeless 2 1 0 1 3  PHQ - 2 Score 3 1 0 3 5  Altered sleeping 3 1 0 1 1  Tired, decreased energy 1 0 0 1 2  Change in appetite 1 0 0 1 2  Feeling bad or failure about yourself  1 1 0 2 3  Trouble concentrating 1 1 0 3 3  Moving slowly or fidgety/restless 0 0 0 1 2  Suicidal thoughts 0 0 0 0 0  PHQ-9 Score 10 4 0 12 18  Difficult doing work/chores Somewhat difficult Not difficult at all Not difficult at all - Somewhat difficult   GAD 7 : Generalized Anxiety Score 06/16/2019  Nervous, Anxious, on Edge 1  Control/stop worrying 3  Worry too much - different things 2  Trouble relaxing 2  Restless 0  Easily annoyed or irritable 1  Afraid - awful might happen 1  Total GAD 7 Score 10  Anxiety Difficulty Not difficult at all   Patient Active Problem List   Diagnosis Date Noted  . Cervical radiculitis 02/26/2018  . Lateral epicondylitis of  right elbow 02/26/2018  . Shoulder pain, left 12/29/2017  . Sarcoidosis of lymph nodes 06/09/2017  . Headache 02/21/2017  . Palpitations 06/10/2016  . Raynaud's phenomenon 06/10/2016  . MAI (mycobacterium avium-intracellulare) ([place]) 03/11/2016  . Lymphadenopathy, mediastinal 02/02/2016  . Cough 01/22/2016  . Anxiety 09/02/2014  . Arthritis 09/02/2014  . Clinical depression 09/02/2014  . Gastro-esophageal reflux disease without esophagitis 09/02/2014  . Insomnia 09/02/2014  . Allergic rhinitis, seasonal 09/02/2014  . Breast pain, left 05/11/2014  . Umbilical hernia 0000000  . Abdominal wall mass of periumbilical region 0000000   Medications: Outpatient Medications Prior to Visit  Medication Sig  . ALPRAZolam (XANAX) 0.5 MG tablet Take 1 tablet (0.5 mg total) by mouth 2 (two) times daily as needed for anxiety. Reported on 04/05/2015  . Barberry-Oreg Grape-Goldenseal (BERBERINE COMPLEX PO) Take 200 mg by mouth daily.   Marland Kitchen buPROPion (WELLBUTRIN XL) 300 MG 24 hr tablet Take 1 tablet (300 mg total) by mouth daily.  Marland Kitchen gabapentin (NEURONTIN) 300 MG capsule Take 1 capsule (300 mg total) by mouth 2 (two) times daily. (Patient not taking: Reported on 01/08/2019)  . loratadine (CLARITIN) 10 MG tablet Take 10 mg by mouth daily as needed for allergies.   . mometasone (NASONEX) 50 MCG/ACT nasal spray Place 2 sprays into the nose daily as needed (congestion).   . Nutritional Supplements (JUICE PLUS FIBRE PO) Take by mouth.   No facility-administered  medications prior to visit.    Review of Systems  Constitutional: Negative for appetite change, chills, fatigue and fever.  Respiratory: Negative for chest tightness and shortness of breath.   Cardiovascular: Negative for chest pain and palpitations.  Gastrointestinal: Negative for abdominal pain, nausea and vomiting.  Neurological: Negative for dizziness and weakness.     Objective    There were no vitals taken for this visit. BP Readings  from Last 3 Encounters:  06/16/19 110/74  02/05/19 122/90  01/08/19 (!) 130/92   Wt Readings from Last 3 Encounters:  06/23/19 162 lb (73.5 kg)  06/16/19 162 lb (73.5 kg)  02/05/19 162 lb 4 oz (73.6 kg)      Physical Exam  ***  No results found for any visits on 08/04/19.  Assessment & Plan     ***  No follow-ups on file.      {provider attestation***:1}   Wilhemena Durie, MD  [facility] [phone] (phone) [phone] (fax)  [place]

## 2019-08-04 ENCOUNTER — Ambulatory Visit: Payer: Self-pay | Admitting: Family Medicine

## 2019-08-11 NOTE — Progress Notes (Signed)
Trena Platt Cummings,acting as a scribe for Wilhemena Durie, MD.,have documented all relevant documentation on the behalf of Wilhemena Durie, MD,as directed by  Wilhemena Durie, MD while in the presence of Wilhemena Durie, MD.  Established patient visit   Patient: Barbara Frederick   DOB: April 10, 1970   49 y.o. Female  MRN: [id number] Visit Date: 08/16/2019  Today's healthcare provider: Wilhemena Durie, MD   Chief Complaint  Patient presents with  . Anxiety  . Depression   Subjective    HPI  Patient is feeling much better.  She is seeing a life coach and this is helping.  She is tolerating the increased dose of Wellbutrin..  She feels that it has helped. Depression, Follow-up  She  was last seen for this 2 months ago. Changes made at last visit include increasing Wellbutrin to 300 MG.   She reports excellent compliance with treatment. She is not having side effects.    She reports excellent tolerance of treatment. Current symptoms include: none She feels she is Improved since last visit.  --------------------------------------------------------------------------------------------------- Anxiety From 06/16/2019-Increased Wellbutrin to 300 mg and return to clinic in 1 to 2 months. Continue to see life coach.  Depression, major, single episode, mild ([place]) From 06/16/2019-Mild reactive depression.  Plan as above.   Depression screen [facility] 2/9 08/16/2019 06/16/2019 10/05/2018 09/16/2017 01/22/2016  Decreased Interest 0 1 0 0 2  Down, Depressed, Hopeless 0 2 1 0 1  PHQ - 2 Score 0 3 1 0 3  Altered sleeping 0 3 1 0 1  Tired, decreased energy 0 1 0 0 1  Change in appetite 0 1 0 0 1  Feeling bad or failure about yourself  0 1 1 0 2  Trouble concentrating 0 1 1 0 3  Moving slowly or fidgety/restless 0 0 0 0 1  Suicidal thoughts 0 0 0 0 0  PHQ-9 Score 0 10 4 0 12  Difficult doing work/chores Not difficult at all Somewhat difficult Not difficult at all Not difficult at  all -   GAD 7 : Generalized Anxiety Score 08/16/2019 06/16/2019  Nervous, Anxious, on Edge 1 1  Control/stop worrying 0 3  Worry too much - different things 0 2  Trouble relaxing 1 2  Restless 0 0  Easily annoyed or irritable 0 1  Afraid - awful might happen 0 1  Total GAD 7 Score 2 10  Anxiety Difficulty Not difficult at all Not difficult at all    Social History   Tobacco Use  . Smoking status: Passive Smoke Exposure - Never Smoker  . Smokeless tobacco: Never Used  . Tobacco comment: Parents smoked around her.   Substance Use Topics  . Alcohol use: Yes    Comment: 3-5/week, wine on the weekend   . Drug use: No       Medications: Outpatient Medications Prior to Visit  Medication Sig  . ALPRAZolam (XANAX) 0.5 MG tablet Take 1 tablet (0.5 mg total) by mouth 2 (two) times daily as needed for anxiety. Reported on 04/05/2015  . Barberry-Oreg Grape-Goldenseal (BERBERINE COMPLEX PO) Take 200 mg by mouth daily.   Marland Kitchen buPROPion (WELLBUTRIN XL) 300 MG 24 hr tablet Take 1 tablet (300 mg total) by mouth daily.  Marland Kitchen loratadine (CLARITIN) 10 MG tablet Take 10 mg by mouth daily as needed for allergies.   . mometasone (NASONEX) 50 MCG/ACT nasal spray Place 2 sprays into the nose daily as needed (congestion).   Marland Kitchen  Nutritional Supplements (JUICE PLUS FIBRE PO) Take by mouth.  . [DISCONTINUED] gabapentin (NEURONTIN) 300 MG capsule Take 1 capsule (300 mg total) by mouth 2 (two) times daily. (Patient not taking: Reported on 01/08/2019)   No facility-administered medications prior to visit.    Review of Systems  Constitutional: Negative for appetite change, chills, fatigue and fever.  Respiratory: Negative for chest tightness and shortness of breath.   Cardiovascular: Negative for chest pain and palpitations.  Gastrointestinal: Negative for abdominal pain, nausea and vomiting.  Neurological: Negative for dizziness and weakness.       Objective    BP 116/83 (BP Location: Left Arm, Patient  Position: Sitting, Cuff Size: Large)   Pulse 79   Temp (!) 97.1 F (36.2 C) (Temporal)   Ht 5\' 8"  (1.727 m)   Wt 160 lb (72.6 kg)   BMI 24.33 kg/m     Physical Exam Vitals reviewed.  Constitutional:      Appearance: Normal appearance.  HENT:     Head: Normocephalic and atraumatic.     Right Ear: External ear normal.     Left Ear: External ear normal.  Eyes:     General: No scleral icterus.    Conjunctiva/sclera: Conjunctivae normal.  Cardiovascular:     Rate and Rhythm: Normal rate and regular rhythm.     Pulses: Normal pulses.     Heart sounds: Normal heart sounds.  Pulmonary:     Effort: Pulmonary effort is normal.     Breath sounds: Normal breath sounds.  Musculoskeletal:     Right lower leg: No edema.     Left lower leg: No edema.  Skin:    General: Skin is warm and dry.  Neurological:     General: No focal deficit present.     Mental Status: She is alert and oriented to person, place, and time.  Psychiatric:        Mood and Affect: Mood normal.        Behavior: Behavior normal.        Thought Content: Thought content normal.        Judgment: Judgment normal.       No results found for any visits on 08/16/19.  Assessment & Plan    1. Anxiety Improved with therapy and medications.  2. Depression, major, single episode, mild ([place])  3.  Health maintenance Patient did not go through a colonoscopy because she could not stand having a swab for Covid.  We will arrange for Cologuard.   No follow-ups on file.      I, Wilhemena Durie, MD, have reviewed all documentation for this visit. The documentation on 08/21/19 for the exam, diagnosis, procedures, and orders are all accurate and complete.    Neythan Kozlov Cranford Mon, MD  [facility] [phone] (phone) [phone] (fax)  [place]

## 2019-08-16 ENCOUNTER — Ambulatory Visit (INDEPENDENT_AMBULATORY_CARE_PROVIDER_SITE_OTHER): Payer: 59 | Admitting: Family Medicine

## 2019-08-16 ENCOUNTER — Encounter: Payer: Self-pay | Admitting: Family Medicine

## 2019-08-16 ENCOUNTER — Other Ambulatory Visit: Payer: Self-pay

## 2019-08-16 VITALS — BP 116/83 | HR 79 | Temp 97.1°F | Ht 68.0 in | Wt 160.0 lb

## 2019-08-16 DIAGNOSIS — F419 Anxiety disorder, unspecified: Secondary | ICD-10-CM | POA: Diagnosis not present

## 2019-08-16 DIAGNOSIS — F320 Major depressive disorder, single episode, mild: Secondary | ICD-10-CM | POA: Diagnosis not present

## 2019-08-17 DIAGNOSIS — H524 Presbyopia: Secondary | ICD-10-CM | POA: Diagnosis not present

## 2019-09-09 DIAGNOSIS — D869 Sarcoidosis, unspecified: Secondary | ICD-10-CM | POA: Diagnosis not present

## 2019-09-09 DIAGNOSIS — A319 Mycobacterial infection, unspecified: Secondary | ICD-10-CM | POA: Diagnosis not present

## 2019-09-09 DIAGNOSIS — R0600 Dyspnea, unspecified: Secondary | ICD-10-CM | POA: Diagnosis not present

## 2019-09-09 DIAGNOSIS — A310 Pulmonary mycobacterial infection: Secondary | ICD-10-CM | POA: Diagnosis not present

## 2019-09-13 ENCOUNTER — Other Ambulatory Visit: Payer: Self-pay | Admitting: *Deleted

## 2019-09-13 DIAGNOSIS — Z1211 Encounter for screening for malignant neoplasm of colon: Secondary | ICD-10-CM

## 2019-09-14 ENCOUNTER — Other Ambulatory Visit: Payer: Self-pay

## 2019-09-14 ENCOUNTER — Ambulatory Visit (INDEPENDENT_AMBULATORY_CARE_PROVIDER_SITE_OTHER): Payer: Self-pay | Admitting: Dermatology

## 2019-09-14 DIAGNOSIS — L988 Other specified disorders of the skin and subcutaneous tissue: Secondary | ICD-10-CM

## 2019-09-14 NOTE — Progress Notes (Signed)
   Follow-Up Visit   Subjective  Barbara Frederick is a 49 y.o. female who presents for the following: Facial Elastosis (Botox to frown complex today).  The following portions of the chart were reviewed this encounter and updated as appropriate:  Tobacco  Allergies  Meds  Problems  Med Hx  Surg Hx  Fam Hx      Review of Systems:  No other skin or systemic complaints except as noted in HPI or Assessment and Plan.  Objective  Well appearing patient in no apparent distress; mood and affect are within normal limits.  A focused examination was performed including face. Relevant physical exam findings are noted in the Assessment and Plan.  Objective  Head - Anterior (Face): [place] and volume loss.   Images           Assessment & Plan    Elastosis of skin Head - Anterior (Face)  Botox Injection - Head - Anterior (Face) Location: See attached image  Informed consent: Discussed risks (infection, pain, bleeding, bruising, swelling, allergic reaction, paralysis of nearby muscles, eyelid droop, double vision, neck weakness, difficulty breathing, headache, undesirable cosmetic result, and need for additional treatment) and benefits of the procedure, as well as the alternatives.  Informed consent was obtained.  Preparation: The area was cleansed with alcohol.  Procedure Details:  Botox was injected into the dermis with a 30-gauge needle. Pressure applied to any bleeding. Ice packs offered for swelling.  Lot Number:  [id number] Expiration:  11/2021  Total Units Injected:  35  Plan: Patient was instructed to remain upright for 4 hours. Patient was instructed to avoid massaging the face and avoid vigorous exercise for the rest of the day. Tylenol may be used for headache.  Allow 2 weeks before returning to clinic for additional dosing as needed. Patient will call for any problems.   Return in about 4 months (around 01/14/2020) for Botox.   I, Ashok Cordia, CMA, am acting as  scribe for Sarina Ser, MD .  Documentation: I have reviewed the above documentation for accuracy and completeness, and I agree with the above.  Sarina Ser, MD

## 2019-09-15 ENCOUNTER — Encounter: Payer: Self-pay | Admitting: Dermatology

## 2019-09-20 DIAGNOSIS — Z1211 Encounter for screening for malignant neoplasm of colon: Secondary | ICD-10-CM | POA: Diagnosis not present

## 2019-09-22 LAB — COLOGUARD: Cologuard: NEGATIVE

## 2019-09-29 LAB — COLOGUARD: Cologuard: NEGATIVE

## 2019-09-30 NOTE — Progress Notes (Signed)
Complete physical exam   Patient: Barbara Frederick   DOB: [birth year]   49 y.o. Female  MRN: [id number] Visit Date: 10/05/2019  Today's healthcare provider: Wilhemena Durie, MD   Chief Complaint  Patient presents with  . Annual Exam   Subjective    Barbara Frederick is a 49 y.o. female who presents today for a complete physical exam.  She reports consuming a general diet. Exercises regularly.  She generally feels well. She reports sleeping well. She does not have additional problems to discuss today.  HPI   Last mammogram: 09/07/2018  Past Medical History:  Diagnosis Date  . Allergy   . Anxiety   . Arthritis    L knee- OA  . Atypical mole 01/19/2015   left mid back paraspinal below bra/mild  . Complication of anesthesia    needed smaller ETT (anes unable to pass 8.0 or 8.5 ETT 02/13/16; 7.0 ETT used 08/24/07)   Past Surgical History:  Procedure Laterality Date  . ABDOMINAL HYSTERECTOMY  2010   Dr Amalia Hailey  . AUGMENTATION MAMMAPLASTY Bilateral 2001  . BREAST ENHANCEMENT SURGERY Bilateral [place] [facility]  . CYST EXCISION    . ENDOBRONCHIAL ULTRASOUND N/A 02/13/2016   Procedure: ENDOBRONCHIAL ULTRASOUND;  Surgeon: Laverle Hobby, MD;  Location: [facility] OR[facility];  Service: Cardiopulmonary;  Laterality: N/A;  . MEDIASTINOSCOPY N/A 03/29/2016   Procedure: Video MEDIASTINOSCOPY WITH LYMPH NODE BIOPSY;  Surgeon: Grace Isaac, MD;  Location: [place];  Service: Thoracic;  Laterality: N/A;  . OVARY SURGERY     one ovary and cervix at [facility]  . VAGINAL DELIVERY     twins   . VIDEO BRONCHOSCOPY N/A 03/29/2016   Procedure: VIDEO BRONCHOSCOPY;  Surgeon: Grace Isaac, MD;  Location: [facility] OR;  Service: Thoracic;  Laterality: N/A;   Social History   Socioeconomic History  . Marital status: Married    Spouse name: Not on file  . Number of children: Not on file  . Years of education: Not on file  . Highest education level: Not on file  Occupational  History  . Not on file  Tobacco Use  . Smoking status: Passive Smoke Exposure - Never Smoker  . Smokeless tobacco: Never Used  . Tobacco comment: Parents smoked around her.   Vaping Use  . Vaping Use: Never used  Substance and Sexual Activity  . Alcohol use: Yes    Comment: 3-5/week, wine on the weekend   . Drug use: No  . Sexual activity: Not on file  Other Topics Concern  . Not on file  Social History Narrative   [facility] (06/10/16):   Originally from [facility] [place] [facility]. Previously has also lived in [place], [place], [place]. Previously has traveled to [place] a few years ago. She has had prior exposure to elephants who appeared ill. Has also traveled to [place]. Does have indoor dogs & an indoor cat. Brief bird exposure. No mold or hot tub exposure. 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 Enjoys playing tennis & skiing. She also enjoys gardening.    Social Determinants of Health   Financial Resource Strain:   . Difficulty of Paying Living Expenses:   Food Insecurity:   . Worried About Charity fundraiser in the Last Year:   . Arboriculturist in the Last Year:   Transportation Needs:   . Film/video editor (Medical):   Marland Kitchen Lack of Transportation (  Non-Medical):   Physical Activity:   . Days of Exercise per Week:   . Minutes of Exercise per Session:   Stress:   . Feeling of Stress :   Social Connections:   . Frequency of Communication with Friends and Family:   . Frequency of Social Gatherings with Friends and Family:   . Attends Religious Services:   . Active Member of Clubs or Organizations:   . Attends Archivist Meetings:   Marland Kitchen Marital Status:   Intimate Partner Violence:   . Fear of Current or Ex-Partner:   . Emotionally Abused:   Marland Kitchen Physically Abused:   . Sexually Abused:    Family Status  Relation Name Status  . PGM  Deceased       cause of death was Alzhemier's  . Mother  Alive  . Father  Alive  . Sister  Alive  .  Brother  Alive  . [employer]  . MGF  Deceased  . PGF  Deceased   Family History  Problem Relation Age of Onset  . Cancer Paternal Grandmother 63       breast  . Breast cancer Paternal Grandmother   . Hypertension Mother   . Raynaud syndrome Mother   . Emphysema Father   . Hypertension Brother   . Cancer Paternal Aunt 25       breast  . Breast cancer Paternal Aunt   . Colon cancer Maternal Grandfather   . Rectal cancer Maternal Grandfather   . Raynaud syndrome Maternal Grandfather   . Lung cancer Paternal Grandfather   . Emphysema Paternal Grandfather   . Pancreatic cancer Paternal Grandfather    Allergies  Allergen Reactions  . Codeine Itching    itching    Patient Care Team: Jerrol Banana., MD as PCP - General (Family Medicine) Kate Sable, MD as PCP - Cardiology (Cardiology) Bary Castilla Forest Gleason, MD (General Surgery) Patient, No Pcp Per (General Practice) Ward, Honor Loh, MD as Referring Physician (Obstetrics and Gynecology)   Medications: Outpatient Medications Prior to Visit  Medication Sig  . ALPRAZolam (XANAX) 0.5 MG tablet Take 1 tablet (0.5 mg total) by mouth 2 (two) times daily as needed for anxiety. Reported on 04/05/2015  . Barberry-Oreg Grape-Goldenseal (BERBERINE COMPLEX PO) Take 200 mg by mouth daily.   Marland Kitchen buPROPion (WELLBUTRIN XL) 300 MG 24 hr tablet Take 1 tablet (300 mg total) by mouth daily.  Marland Kitchen loratadine (CLARITIN) 10 MG tablet Take 10 mg by mouth daily as needed for allergies.   . mometasone (NASONEX) 50 MCG/ACT nasal spray Place 2 sprays into the nose daily as needed (congestion).   . Nutritional Supplements (JUICE PLUS FIBRE PO) Take by mouth.   No facility-administered medications prior to visit.    Review of Systems  Constitutional: Negative.   HENT: Negative.   Eyes: Negative.   Respiratory: Negative.   Cardiovascular: Negative.   Gastrointestinal: Positive for abdominal distention. Negative for abdominal pain, anal  bleeding, blood in stool, constipation, diarrhea, nausea, rectal pain and vomiting.  Endocrine: Negative.   Genitourinary: Positive for enuresis. Negative for decreased urine volume, difficulty urinating, dyspareunia, dysuria, flank pain, frequency, genital sores, hematuria, menstrual problem, pelvic pain, urgency, vaginal bleeding, vaginal discharge and vaginal pain.  Musculoskeletal: Negative.   Skin: Positive for color change. Negative for pallor, rash and wound.  Allergic/Immunologic: Negative.   Neurological: Negative.   Hematological: Negative.   Psychiatric/Behavioral: Negative.     Last CBC Lab Results  Component Value  Date   WBC 5.7 10/09/2018   HGB 14.4 10/09/2018   HCT 41.9 10/09/2018   MCV 91 10/09/2018   MCH 31.3 10/09/2018   RDW 12.4 10/09/2018   PLT 229 25/85/2778   Last metabolic panel Lab Results  Component Value Date   GLUCOSE 106 (H) 10/09/2018   NA 143 10/09/2018   K 4.5 10/09/2018   CL 96 10/09/2018   CO2 25 10/09/2018   BUN 13 10/09/2018   CREATININE 0.78 10/09/2018   GFRNONAA 89 10/09/2018   GFRAA 103 10/09/2018   CALCIUM 9.8 10/09/2018   PROT 6.7 10/09/2018   ALBUMIN 4.7 10/09/2018   LABGLOB 2.0 10/09/2018   AGRATIO 2.4 (H) 10/09/2018   BILITOT 0.3 10/09/2018   ALKPHOS 78 10/09/2018   AST 34 10/09/2018   ALT 28 10/09/2018   ANIONGAP 8 03/26/2016   Last lipids Lab Results  Component Value Date   CHOL 237 (H) 10/09/2018   HDL 59 10/09/2018   LDLCALC 141 (H) 10/09/2018   TRIG 185 (H) 10/09/2018   CHOLHDL 4.0 10/09/2018   Last hemoglobin A1c No results found for: HGBA1C Last thyroid functions Lab Results  Component Value Date   TSH 2.660 10/09/2018   Last vitamin D No results found for: 25OHVITD2, 25OHVITD3, VD25OH Last vitamin B12 and Folate No results found for: VITAMINB12, FOLATE  Objective    BP 116/70 (BP Location: Left Arm, Patient Position: Sitting, Cuff Size: Large)   Pulse 92   Temp (!) 96.9 F (36.1 C) (Temporal)    Ht 5' 7.5" (1.715 m)   Wt 160 lb (72.6 kg)   SpO2 98%   BMI 24.69 kg/m    Physical Exam Vitals reviewed.  Constitutional:      Appearance: She is well-developed.  HENT:     Head: Normocephalic and atraumatic.     Right Ear: External ear normal.     Left Ear: External ear normal.     Nose: Nose normal.  Eyes:     General: No scleral icterus.    Conjunctiva/sclera: Conjunctivae normal.  Neck:     Thyroid: No thyromegaly.  Cardiovascular:     Rate and Rhythm: Normal rate and regular rhythm.     Heart sounds: Normal heart sounds.  Pulmonary:     Effort: Pulmonary effort is normal.     Breath sounds: Normal breath sounds.  Abdominal:     Palpations: Abdomen is soft.  Musculoskeletal:     Right lower leg: No edema.     Left lower leg: No edema.  Skin:    General: Skin is warm and dry.     Comments: She does have some patches of vitiligo in her right axilla.  Neurological:     General: No focal deficit present.     Mental Status: She is alert and oriented to person, place, and time.     Cranial Nerves: No cranial nerve deficit.     Coordination: Coordination normal.     Comments: Grossly nonfocal.  Psychiatric:        Mood and Affect: Mood normal.        Behavior: Behavior normal.        Thought Content: Thought content normal.        Judgment: Judgment normal.       Last depression screening scores PHQ 2/9 Scores 10/05/2019 08/16/2019 06/16/2019  PHQ - 2 Score 1 0 3  PHQ- 9 Score 4 0 10   Last fall risk screening Fall Risk  10/05/2019  Falls in the past year? 0  Number falls in past yr: 0  Injury with Fall? 0  Follow up Falls evaluation completed   Last Audit-C alcohol use screening Alcohol Use Disorder Test (AUDIT) 10/05/2019  1. How often do you have a drink containing alcohol? 3  2. How many drinks containing alcohol do you have on a typical day when you are drinking? 0  3. How often do you have six or more drinks on one occasion? 1  AUDIT-C Score 4    Alcohol Brief Interventions/Follow-up -   A score of 3 or more in women, and 4 or more in men indicates increased risk for alcohol abuse, EXCEPT if all of the points are from question 1   No results found for any visits on 10/05/19.  Assessment & Plan    Routine Health Maintenance and Physical Exam  Exercise Activities and Dietary recommendations Goals   None     Immunization History  Administered Date(s) Administered  . Influenza Split 01/06/2017  . Influenza-Unspecified 01/07/2015, 01/07/2016, 12/23/2017  . PFIZER SARS-COV-2 Vaccination 06/11/2019, 07/07/2019    Health Maintenance  Topic Date Due  . Hepatitis C Screening  Never done  . HIV Screening  Never done  . TETANUS/TDAP  Never done  . PAP SMEAR-Modifier  Never done  . INFLUENZA VACCINE  10/24/2019  . MAMMOGRAM  09/06/2020  . Fecal DNA (Cologuard)  09/20/2022  . COVID-19 Vaccine  Completed    Discussed health benefits of physical activity, and encouraged her to engage in regular exercise appropriate for her age and condition.  1. Annual physical exam Well woman exam followed by gynecology Patient just had negative Cologuard.  Colonoscopy in 2024.  He simply declined colonoscopy this year because she did not want to have a Covid test. - CBC with Differential/Platelet - Lipid panel - TSH - Comprehensive metabolic panel - POCT urinalysis dipstick  No follow-ups on file.     I, Wilhemena Durie, MD, have reviewed all documentation for this visit. The documentation on 10/08/19 for the exam, diagnosis, procedures, and orders are all accurate and complete.    Kamaree Berkel Cranford Mon, MD  [facility] [phone] (phone) [phone] (fax)  [place]

## 2019-10-05 ENCOUNTER — Ambulatory Visit (INDEPENDENT_AMBULATORY_CARE_PROVIDER_SITE_OTHER): Payer: 59 | Admitting: Family Medicine

## 2019-10-05 ENCOUNTER — Encounter: Payer: Self-pay | Admitting: Family Medicine

## 2019-10-05 ENCOUNTER — Other Ambulatory Visit: Payer: Self-pay

## 2019-10-05 VITALS — BP 116/70 | HR 92 | Temp 96.9°F | Ht 67.5 in | Wt 160.0 lb

## 2019-10-05 DIAGNOSIS — Z0000 Encounter for general adult medical examination without abnormal findings: Secondary | ICD-10-CM | POA: Diagnosis not present

## 2019-10-05 LAB — POCT URINALYSIS DIPSTICK
Bilirubin, UA: NEGATIVE
Blood, UA: NEGATIVE
Glucose, UA: NEGATIVE
Ketones, UA: NEGATIVE
Leukocytes, UA: NEGATIVE
Nitrite, UA: NEGATIVE
Protein, UA: NEGATIVE
Spec Grav, UA: <=1.005 — AB (ref 1.010–1.025)
Urobilinogen, UA: 0.2 E.U./dL
pH, UA: 6 (ref 5.0–8.0)

## 2019-10-08 ENCOUNTER — Telehealth: Payer: Self-pay

## 2019-10-08 NOTE — Telephone Encounter (Signed)
Will return patient's call when PCP has resulted lab work.

## 2019-10-08 NOTE — Telephone Encounter (Signed)
Copied from [place] [phone]. Topic: Quick Communication - Lab Results (Clinic Use ONLY) >> Oct 08, 2019  9:48 AM Lennox Solders wrote: Pt is calling and has viewed her blood work result and would like to discuss abnormal lab

## 2019-10-12 DIAGNOSIS — Z0000 Encounter for general adult medical examination without abnormal findings: Secondary | ICD-10-CM | POA: Diagnosis not present

## 2019-10-13 ENCOUNTER — Telehealth: Payer: Self-pay

## 2019-10-13 ENCOUNTER — Other Ambulatory Visit: Payer: Self-pay | Admitting: Family Medicine

## 2019-10-13 ENCOUNTER — Other Ambulatory Visit: Payer: Self-pay | Admitting: Obstetrics & Gynecology

## 2019-10-13 DIAGNOSIS — F320 Major depressive disorder, single episode, mild: Secondary | ICD-10-CM

## 2019-10-13 DIAGNOSIS — R000 Tachycardia, unspecified: Secondary | ICD-10-CM

## 2019-10-13 DIAGNOSIS — Z1231 Encounter for screening mammogram for malignant neoplasm of breast: Secondary | ICD-10-CM

## 2019-10-13 DIAGNOSIS — F419 Anxiety disorder, unspecified: Secondary | ICD-10-CM

## 2019-10-13 DIAGNOSIS — I7300 Raynaud's syndrome without gangrene: Secondary | ICD-10-CM

## 2019-10-13 LAB — CBC WITH DIFFERENTIAL/PLATELET
Basophils Absolute: 0.1 10*3/uL (ref 0.0–0.2)
Basos: 1 %
EOS (ABSOLUTE): 0.2 10*3/uL (ref 0.0–0.4)
Eos: 4 %
Hematocrit: 44 % (ref 34.0–46.6)
Hemoglobin: 14.6 g/dL (ref 11.1–15.9)
Immature Grans (Abs): 0 10*3/uL (ref 0.0–0.1)
Immature Granulocytes: 0 %
Lymphocytes Absolute: 1.2 10*3/uL (ref 0.7–3.1)
Lymphs: 21 %
MCH: 30.3 pg (ref 26.6–33.0)
MCHC: 33.2 g/dL (ref 31.5–35.7)
MCV: 91 fL (ref 79–97)
Monocytes Absolute: 0.7 10*3/uL (ref 0.1–0.9)
Monocytes: 14 %
Neutrophils Absolute: 3.3 10*3/uL (ref 1.4–7.0)
Neutrophils: 60 %
Platelets: 230 10*3/uL (ref 150–450)
RBC: 4.82 x10E6/uL (ref 3.77–5.28)
RDW: 12.9 % (ref 11.7–15.4)
WBC: 5.5 10*3/uL (ref 3.4–10.8)

## 2019-10-13 LAB — COMPREHENSIVE METABOLIC PANEL
ALT: 23 IU/L (ref 0–32)
AST: 24 IU/L (ref 0–40)
Albumin/Globulin Ratio: 2.4 — ABNORMAL HIGH (ref 1.2–2.2)
Albumin: 4.8 g/dL (ref 3.8–4.9)
Alkaline Phosphatase: 84 IU/L (ref 48–121)
BUN/Creatinine Ratio: 11 (ref 9–23)
BUN: 9 mg/dL (ref 6–24)
Bilirubin Total: 0.6 mg/dL (ref 0.0–1.2)
CO2: 28 mmol/L (ref 20–29)
Calcium: 9.7 mg/dL (ref 8.7–10.2)
Chloride: 99 mmol/L (ref 96–106)
Creatinine, Ser: 0.81 mg/dL (ref 0.57–1.00)
GFR calc Af Amer: 97 mL/min/{1.73_m2} (ref >59)
GFR calc non Af Amer: 84 mL/min/{1.73_m2} (ref >59)
Globulin, Total: 2 g/dL (ref 1.5–4.5)
Glucose: 108 mg/dL — ABNORMAL HIGH (ref 65–99)
Potassium: 4.6 mmol/L (ref 3.5–5.2)
Sodium: 139 mmol/L (ref 134–144)
Total Protein: 6.8 g/dL (ref 6.0–8.5)

## 2019-10-13 LAB — LIPID PANEL
Chol/HDL Ratio: 3.7 ratio (ref 0.0–4.4)
Cholesterol, Total: 236 mg/dL — ABNORMAL HIGH (ref 100–199)
HDL: 63 mg/dL (ref >39)
LDL Chol Calc (NIH): 147 mg/dL — ABNORMAL HIGH (ref 0–99)
Triglycerides: 144 mg/dL (ref 0–149)
VLDL Cholesterol Cal: 26 mg/dL (ref 5–40)

## 2019-10-13 LAB — TSH: TSH: 2.22 u[IU]/mL (ref 0.450–4.500)

## 2019-10-13 NOTE — Telephone Encounter (Signed)
-----   Message from Jerrol Banana., MD sent at 10/13/2019  3:50 PM EDT ----- Glucose and cholesterol mildly elevated.  Lifestyle modification with diet and exercise as the treatment of choice at this time.  Repeat labs in 3 to 6 months.

## 2019-10-13 NOTE — Telephone Encounter (Signed)
Pt calling again and is requesting to speak with someone regarding her lab results. Please advise.

## 2019-10-13 NOTE — Telephone Encounter (Signed)
Patient advised of lab results and verbalized understanding. Patient says she will work on diet and exercise. Said that she didn't know If she had mentioned to pcp that her brother had to have quadruple bipass surgery so that is why she is so concerned but she said she will work on this. Lab orders will be placed for future for whenever she returns.

## 2019-11-01 DIAGNOSIS — R140 Abdominal distension (gaseous): Secondary | ICD-10-CM | POA: Diagnosis not present

## 2019-11-01 DIAGNOSIS — D869 Sarcoidosis, unspecified: Secondary | ICD-10-CM | POA: Diagnosis not present

## 2019-11-09 ENCOUNTER — Ambulatory Visit
Admission: RE | Admit: 2019-11-09 | Discharge: 2019-11-09 | Disposition: A | Discharge status: Home / Self Care | Payer: 59 | Source: Ambulatory Visit | Attending: Family Medicine | Admitting: Family Medicine

## 2019-11-09 ENCOUNTER — Other Ambulatory Visit: Payer: Self-pay

## 2019-11-09 DIAGNOSIS — Z1231 Encounter for screening mammogram for malignant neoplasm of breast: Secondary | ICD-10-CM | POA: Diagnosis not present

## 2019-11-11 ENCOUNTER — Other Ambulatory Visit: Payer: Self-pay | Admitting: Obstetrics & Gynecology

## 2019-11-11 DIAGNOSIS — D869 Sarcoidosis, unspecified: Secondary | ICD-10-CM

## 2019-11-11 DIAGNOSIS — R140 Abdominal distension (gaseous): Secondary | ICD-10-CM

## 2019-11-23 ENCOUNTER — Other Ambulatory Visit: Payer: Self-pay

## 2019-11-23 ENCOUNTER — Ambulatory Visit
Admission: RE | Admit: 2019-11-23 | Discharge: 2019-11-23 | Disposition: A | Discharge status: Home / Self Care | Payer: 59 | Source: Ambulatory Visit | Attending: Obstetrics & Gynecology | Admitting: Obstetrics & Gynecology

## 2019-11-23 DIAGNOSIS — R140 Abdominal distension (gaseous): Secondary | ICD-10-CM | POA: Insufficient documentation

## 2019-11-23 DIAGNOSIS — D869 Sarcoidosis, unspecified: Secondary | ICD-10-CM | POA: Diagnosis not present

## 2019-11-23 DIAGNOSIS — R634 Abnormal weight loss: Secondary | ICD-10-CM | POA: Diagnosis not present

## 2019-11-23 DIAGNOSIS — Z90710 Acquired absence of both cervix and uterus: Secondary | ICD-10-CM | POA: Diagnosis not present

## 2019-11-23 DIAGNOSIS — M47816 Spondylosis without myelopathy or radiculopathy, lumbar region: Secondary | ICD-10-CM | POA: Diagnosis not present

## 2019-11-23 MED ORDER — IOHEXOL 300 MG/ML  SOLN
100.0000 mL | Freq: Once | INTRAMUSCULAR | Status: AC | PRN
  Administered 2019-11-23: 100 mL via INTRAVENOUS

## 2020-01-02 DIAGNOSIS — F330 Major depressive disorder, recurrent, mild: Secondary | ICD-10-CM | POA: Diagnosis not present

## 2020-01-02 DIAGNOSIS — F411 Generalized anxiety disorder: Secondary | ICD-10-CM | POA: Diagnosis not present

## 2020-01-03 DIAGNOSIS — F411 Generalized anxiety disorder: Secondary | ICD-10-CM | POA: Diagnosis not present

## 2020-01-03 DIAGNOSIS — F330 Major depressive disorder, recurrent, mild: Secondary | ICD-10-CM | POA: Diagnosis not present

## 2020-01-14 ENCOUNTER — Other Ambulatory Visit: Payer: Self-pay | Admitting: Internal Medicine

## 2020-01-14 ENCOUNTER — Ambulatory Visit: Payer: 59 | Attending: Internal Medicine

## 2020-01-14 DIAGNOSIS — Z23 Encounter for immunization: Secondary | ICD-10-CM

## 2020-01-14 NOTE — Progress Notes (Signed)
° °  Covid-19 Vaccination Clinic  Name:  Barbara Frederick    MRN: [id number] DOB: 03-31-1970  01/14/2020  Barbara Frederick was observed post Covid-19 immunization for 15 minutes without incident. She was provided with Vaccine Information Sheet and instruction to access the V-Safe system.   Barbara Frederick was instructed to call 911 with any severe reactions post vaccine:  Difficulty breathing   Swelling of face and throat   A fast heartbeat   A bad rash all over body   Dizziness and weakness

## 2020-03-02 ENCOUNTER — Encounter: Payer: Self-pay | Admitting: Family Medicine

## 2020-03-02 ENCOUNTER — Other Ambulatory Visit: Payer: Self-pay | Admitting: Family Medicine

## 2020-03-02 ENCOUNTER — Telehealth (INDEPENDENT_AMBULATORY_CARE_PROVIDER_SITE_OTHER): Payer: 59 | Admitting: Family Medicine

## 2020-03-02 DIAGNOSIS — J0100 Acute maxillary sinusitis, unspecified: Secondary | ICD-10-CM | POA: Diagnosis not present

## 2020-03-02 MED ORDER — AMOXICILLIN-POT CLAVULANATE 875-125 MG PO TABS: 1.0000 | ORAL_TABLET | Freq: Two times a day (BID) | ORAL | 0 refills | Status: DC

## 2020-03-02 NOTE — Progress Notes (Signed)
I,April Miller,acting as a scribe for Wilhemena Durie, MD.,have documented all relevant documentation on the behalf of Wilhemena Durie, MD,as directed by  Wilhemena Durie, MD while in the presence of Wilhemena Durie, MD.  MyChart Video Visit    Virtual Visit via Video Note   This visit type was conducted due to national recommendations for restrictions regarding the COVID-19 Pandemic (e.g. social distancing) in an effort to limit this patient's exposure and mitigate transmission in our community. This patient is at least at moderate risk for complications without adequate follow up. This format is felt to be most appropriate for this patient at this time. Physical exam was limited by quality of the video and audio technology used for the visit.   Patient location: home  Provider location: office  I discussed the limitations of evaluation and management by telemedicine and the availability of in person appointments. The patient expressed understanding and agreed to proceed.  Patient: Barbara Frederick   DOB: 27-Dec-1970   49 y.o. Female  MRN: [id number] Visit Date: 03/02/2020  Today's healthcare provider: Wilhemena Durie, MD   No chief complaint on file.  Subjective    HPI  Patient complains of 4 or 5 days of sinus pressure and sinus pain in her area of her cheekbones and in her upper teeth bilaterally.  Her daughter had a recent similar problem.  She has had both Covid vaccines.  He was recently at a retreat with work.  She has had no cough no myalgias no fever or chills.     Medications: Outpatient Medications Prior to Visit  Medication Sig  . ALPRAZolam (XANAX) 0.5 MG tablet Take 1 tablet (0.5 mg total) by mouth 2 (two) times daily as needed for anxiety. Reported on 04/05/2015  . Barberry-Oreg Grape-Goldenseal (BERBERINE COMPLEX PO) Take 200 mg by mouth daily.   . Cyanocobalamin (VITAMIN B-12 PO) Take by mouth.  . loratadine (CLARITIN) 10 MG tablet Take 10 mg by  mouth daily as needed for allergies.   . mometasone (NASONEX) 50 MCG/ACT nasal spray Place 2 sprays into the nose daily as needed (congestion).   . Nutritional Supplements (JUICE PLUS FIBRE PO) Take by mouth.  Marland Kitchen buPROPion (WELLBUTRIN XL) 300 MG 24 hr tablet Take 1 tablet (300 mg total) by mouth daily. (Patient not taking: Reported on 03/02/2020)   No facility-administered medications prior to visit.    Review of Systems     Objective    There were no vitals taken for this visit.    Physical Exam  I was able to see her virtually before we had to switch to the phone.  She was in no acute distress.  She was breathing well she was able to answer all questions without any difficulty.   Assessment & Plan     1. Subacute maxillary sinusitis Due to prevalence of Covid will check for Covid. Try Robitussin, Tylenol for pain.  And use daily vitamin C vitamin D and zinc until she is well.  Follow-up as needed. - amoxicillin-clavulanate (AUGMENTIN) 875-125 MG tablet; Take 1 tablet by mouth 2 (two) times daily.  Dispense: 20 tablet; Refill: 0 - Novel Coronavirus, NAA ([facility])   No follow-ups on file.     I discussed the assessment and treatment plan with the patient. The patient was provided an opportunity to ask questions and all were answered. The patient agreed with the plan and demonstrated an understanding of the instructions.   The patient was advised  to call back or seek an in-person evaluation if the symptoms worsen or if the condition fails to improve as anticipated.  I provided 12 minutes of non-face-to-face time during this encounter.    Richard Cranford Mon, MD [facility] [phone] (phone) [phone] (fax)  [place]

## 2020-03-03 LAB — NOVEL CORONAVIRUS, NAA: SARS-CoV-2, NAA: NOT DETECTED

## 2020-03-03 LAB — SARS-COV-2, NAA 2 DAY TAT

## 2020-03-03 LAB — SPECIMEN STATUS REPORT

## 2020-03-07 ENCOUNTER — Ambulatory Visit: Payer: 59 | Admitting: Family Medicine

## 2020-03-10 ENCOUNTER — Ambulatory Visit: Payer: 59

## 2020-03-21 ENCOUNTER — Other Ambulatory Visit: Payer: Self-pay | Admitting: Psychiatry

## 2020-03-21 DIAGNOSIS — F330 Major depressive disorder, recurrent, mild: Secondary | ICD-10-CM | POA: Diagnosis not present

## 2020-03-21 DIAGNOSIS — F411 Generalized anxiety disorder: Secondary | ICD-10-CM | POA: Diagnosis not present

## 2020-04-04 ENCOUNTER — Other Ambulatory Visit: Payer: Self-pay | Admitting: Psychiatry

## 2020-04-04 DIAGNOSIS — F330 Major depressive disorder, recurrent, mild: Secondary | ICD-10-CM | POA: Diagnosis not present

## 2020-04-04 DIAGNOSIS — F411 Generalized anxiety disorder: Secondary | ICD-10-CM | POA: Diagnosis not present

## 2020-04-24 ENCOUNTER — Telehealth: Payer: 59 | Admitting: Physician Assistant

## 2020-04-24 ENCOUNTER — Other Ambulatory Visit: Payer: Self-pay | Admitting: Physician Assistant

## 2020-04-24 ENCOUNTER — Other Ambulatory Visit: Payer: 59

## 2020-04-24 DIAGNOSIS — Z20822 Contact with and (suspected) exposure to covid-19: Secondary | ICD-10-CM | POA: Diagnosis not present

## 2020-04-24 MED ORDER — FLUTICASONE PROPIONATE 50 MCG/ACT NA SUSP: 2.0000 | Freq: Every day | NASAL | 0 refills | Status: DC

## 2020-04-24 NOTE — Progress Notes (Signed)
I have spent 5 minutes in review of e-visit questionnaire, review and updating patient chart, medical decision making and response to patient.   Ryett Hamman Cody Donneisha Beane, PA-C    

## 2020-04-24 NOTE — Progress Notes (Signed)
E-Visit for Corona Virus Screening  Your current symptoms could be consistent with the coronavirus.  Many health care providers can now test patients at their office but not all are.  [place] has multiple testing sites. For information on our [place] testing locations and hours go to [email or web address]  We are enrolling you in our [place] for [place] . Daily you will receive a questionnaire within the [place] website. Our COVID 19 response team will be monitoring your responses daily.  Testing Information: The COVID-19 Community Testing sites are testing BY APPOINTMENT ONLY.  You can schedule online at [email or web address]  If you do not have access to a smart phone or computer you may call [phone] for an appointment.   Additional testing sites in the Community:  . For [facility] Testing sites in [facility]  FaceUpdate.[email or web address]  . For Pop-up testing sites in [place]  BowlDirectory.co.uk  . For [facility] [email or web address]  . For [facility] testing in [place] [employer] [email or web address]  . For [facility] testing in [facility]   [email or web address]  For  more information about community testing call [phone]   Please quarantine yourself while awaiting your test results. Please stay home for a minimum of 10 days from the first day of illness with improving symptoms and you have had 24 hours of no fever (without the use of Tylenol (Acetaminophen) Motrin (Ibuprofen) or any fever reducing medication).  Also - Do not get tested prior to returning to work because once you have had a positive test the test can stay  positive for more than a month in some cases.   You should wear a mask or cloth face covering over your nose and mouth if you must be around other people or animals, including pets (even at home). Try to stay at least 6 feet away from other people. This will protect the people around you.  Please continue good preventive care measures, including:  frequent hand-washing, avoid touching your face, cover coughs/sneezes, stay out of crowds and keep a 6 foot distance from others.  COVID-19 is a respiratory illness with symptoms that are similar to the flu. Symptoms are typically mild to moderate, but there have been cases of severe illness and death due to the virus.   The following symptoms may appear 2-14 days after exposure: . Fever . Cough . Shortness of breath or difficulty breathing . Chills . Repeated shaking with chills . Muscle pain . Headache . Sore throat . New loss of taste or smell . Fatigue . Congestion or runny nose . Nausea or vomiting . Diarrhea  Go to the nearest hospital ED for assessment if fever/cough/breathlessness are severe or illness seems like a threat to life.  It is vitally important that if you feel that you have an infection such as this virus or any other virus that you stay home and away from places where you may spread it to others.  You should avoid contact with people age 80 and older.   You can use medication such as A prescription for Fluticasone nasal spray 2 sprays in each nostril one time per day -- this will help with nasal congestion.   You may also take acetaminophen (Tylenol) as needed for fever.  Reduce your risk of any infection by using the same precautions used for avoiding the common cold or flu:  Marland Kitchen Wash your hands often with soap and warm water for at least 20 seconds.  If soap and water  are not readily available, use an alcohol-based hand sanitizer with at least 60% alcohol.  . If coughing or sneezing, cover your mouth and nose by coughing or  sneezing into the elbow areas of your shirt or coat, into a tissue or into your sleeve (not your hands). . Avoid shaking hands with others and consider head nods or verbal greetings only. . Avoid touching your eyes, nose, or mouth with unwashed hands.  . Avoid close contact with people who are sick. . Avoid places or events with large numbers of people in one location, like concerts or sporting events. . Carefully consider travel plans you have or are making. . If you are planning any travel outside or inside the [place], visit the CDC's Travelers' Health webpage for the latest health notices. . If you have some symptoms but not all symptoms, continue to monitor at home and seek medical attention if your symptoms worsen. . If you are having a medical emergency, call 911.  HOME CARE . Only take medications as instructed by your medical team. . Drink plenty of fluids and get plenty of rest. . A steam or ultrasonic humidifier can help if you have congestion.   GET HELP RIGHT AWAY IF YOU HAVE EMERGENCY WARNING SIGNS** FOR COVID-19. If you or someone is showing any of these signs seek emergency medical care immediately. Call 911 or proceed to your closest emergency facility if: . You develop worsening high fever. . Trouble breathing . Bluish lips or face . Persistent pain or pressure in the chest . New confusion . Inability to wake or stay awake . You cough up blood. . Your symptoms become more severe  **This list is not all possible symptoms. Contact your medical provider for any symptoms that are sever or concerning to you.  MAKE SURE YOU   Understand these instructions.  Will watch your condition.  Will get help right away if you are not doing well or get worse.  Your e-visit answers were reviewed by a board certified advanced clinical practitioner to complete your personal care plan.  Depending on the condition, your plan could have included both over the counter or prescription  medications.  If there is a problem please reply once you have received a response from your provider.  Your safety is important to [place].  If you have drug allergies check your prescription carefully.    You can use MyChart to ask questions about today's visit, request a non-urgent call back, or ask for a work or school excuse for 24 hours related to this e-Visit. If it has been greater than 24 hours you will need to follow up with your provider, or enter a new e-Visit to address those concerns. You will get an e-mail in the next two days asking about your experience.  I hope that your e-visit has been valuable and will speed your recovery. Thank you for using e-visits.

## 2020-04-26 LAB — NOVEL CORONAVIRUS, NAA: SARS-CoV-2, NAA: NOT DETECTED

## 2020-04-26 LAB — SARS-COV-2, NAA 2 DAY TAT

## 2020-05-04 ENCOUNTER — Other Ambulatory Visit: Payer: Self-pay | Admitting: Psychiatry

## 2020-05-04 DIAGNOSIS — F411 Generalized anxiety disorder: Secondary | ICD-10-CM | POA: Diagnosis not present

## 2020-05-04 DIAGNOSIS — F330 Major depressive disorder, recurrent, mild: Secondary | ICD-10-CM | POA: Diagnosis not present

## 2020-05-15 ENCOUNTER — Other Ambulatory Visit: Payer: Self-pay | Admitting: Psychiatry

## 2020-08-02 ENCOUNTER — Ambulatory Visit: Payer: 59 | Admitting: Dermatology

## 2020-10-10 ENCOUNTER — Ambulatory Visit (INDEPENDENT_AMBULATORY_CARE_PROVIDER_SITE_OTHER): Payer: Self-pay | Admitting: Dermatology

## 2020-10-10 ENCOUNTER — Other Ambulatory Visit: Payer: Self-pay

## 2020-10-10 DIAGNOSIS — L988 Other specified disorders of the skin and subcutaneous tissue: Secondary | ICD-10-CM

## 2020-10-10 NOTE — Progress Notes (Signed)
   Follow-Up Visit   Subjective  Barbara Frederick is a 50 y.o. female who presents for the following: Facial Elastosis (Patient is here today for Botox injections).  The following portions of the chart were reviewed this encounter and updated as appropriate:   Tobacco  Allergies  Meds  Problems  Med Hx  Surg Hx  Fam Hx     Review of Systems:  No other skin or systemic complaints except as noted in HPI or Assessment and Plan.  Objective  Well appearing patient in no apparent distress; mood and affect are within normal limits.  A focused examination was performed including the face. Relevant physical exam findings are noted in the Assessment and Plan.  Face Rhytides and volume loss.                 Assessment & Plan  Elastosis of skin Face  Botox 35 units injected as marked:  - Frown complex 25 units  - B/L crow's feet 5 units each  Botox Injection - Face Location: See attached image  Informed consent: Discussed risks (infection, pain, bleeding, bruising, swelling, allergic reaction, paralysis of nearby muscles, eyelid droop, double vision, neck weakness, difficulty breathing, headache, undesirable cosmetic result, and need for additional treatment) and benefits of the procedure, as well as the alternatives.  Informed consent was obtained.  Preparation: The area was cleansed with alcohol.  Procedure Details:  Botox was injected into the dermis with a 30-gauge needle. Pressure applied to any bleeding. Ice packs offered for swelling.  Lot Number:  [id number] Expiration:  05/2022  Total Units Injected:  35  Plan: Patient was instructed to remain upright for 4 hours. Patient was instructed to avoid massaging the face and avoid vigorous exercise for the rest of the day. Tylenol may be used for headache.  Allow 2 weeks before returning to clinic for additional dosing as needed. Patient will call for any problems.   Return for Botox in 3-4 mths.  Luther Redo,  CMA, am acting as scribe for Sarina Ser, MD . Documentation: I have reviewed the above documentation for accuracy and completeness, and I agree with the above.  Sarina Ser, MD

## 2020-10-10 NOTE — Patient Instructions (Signed)

## 2020-10-11 ENCOUNTER — Encounter: Payer: Self-pay | Admitting: Dermatology

## 2020-10-27 ENCOUNTER — Telehealth: Payer: Self-pay | Admitting: Family Medicine

## 2020-10-27 DIAGNOSIS — Z1231 Encounter for screening mammogram for malignant neoplasm of breast: Secondary | ICD-10-CM

## 2020-10-27 NOTE — Telephone Encounter (Signed)
Pt is calling to request a referral to [facility] for a mammogram  Cb- [phone]

## 2020-10-27 NOTE — Telephone Encounter (Signed)
Order for mammo placed

## 2020-11-13 ENCOUNTER — Other Ambulatory Visit: Payer: Self-pay | Admitting: Family Medicine

## 2020-11-13 DIAGNOSIS — Z1231 Encounter for screening mammogram for malignant neoplasm of breast: Secondary | ICD-10-CM

## 2020-11-23 ENCOUNTER — Other Ambulatory Visit: Payer: Self-pay

## 2020-11-23 ENCOUNTER — Ambulatory Visit
Admission: RE | Admit: 2020-11-23 | Discharge: 2020-11-23 | Disposition: A | Discharge status: Home / Self Care | Payer: No Typology Code available for payment source | Source: Ambulatory Visit | Attending: Family Medicine | Admitting: Family Medicine

## 2020-11-23 DIAGNOSIS — Z1231 Encounter for screening mammogram for malignant neoplasm of breast: Secondary | ICD-10-CM | POA: Insufficient documentation

## 2021-01-10 ENCOUNTER — Telehealth: Payer: No Typology Code available for payment source | Admitting: Family

## 2021-01-10 DIAGNOSIS — J329 Chronic sinusitis, unspecified: Secondary | ICD-10-CM

## 2021-01-10 MED ORDER — AMOXICILLIN-POT CLAVULANATE 875-125 MG PO TABS: 1.0000 | ORAL_TABLET | Freq: Two times a day (BID) | ORAL | 0 refills | Status: DC

## 2021-01-10 NOTE — Progress Notes (Signed)

## 2021-02-06 ENCOUNTER — Ambulatory Visit (INDEPENDENT_AMBULATORY_CARE_PROVIDER_SITE_OTHER): Payer: No Typology Code available for payment source | Admitting: Dermatology

## 2021-02-06 ENCOUNTER — Other Ambulatory Visit: Payer: Self-pay

## 2021-02-06 DIAGNOSIS — D225 Melanocytic nevi of trunk: Secondary | ICD-10-CM | POA: Diagnosis not present

## 2021-02-06 DIAGNOSIS — D229 Melanocytic nevi, unspecified: Secondary | ICD-10-CM

## 2021-02-06 DIAGNOSIS — D235 Other benign neoplasm of skin of trunk: Secondary | ICD-10-CM | POA: Diagnosis not present

## 2021-02-06 DIAGNOSIS — L821 Other seborrheic keratosis: Secondary | ICD-10-CM

## 2021-02-06 DIAGNOSIS — Z1283 Encounter for screening for malignant neoplasm of skin: Secondary | ICD-10-CM

## 2021-02-06 DIAGNOSIS — L814 Other melanin hyperpigmentation: Secondary | ICD-10-CM

## 2021-02-06 DIAGNOSIS — D239 Other benign neoplasm of skin, unspecified: Secondary | ICD-10-CM

## 2021-02-06 DIAGNOSIS — L578 Other skin changes due to chronic exposure to nonionizing radiation: Secondary | ICD-10-CM

## 2021-02-06 DIAGNOSIS — D485 Neoplasm of uncertain behavior of skin: Secondary | ICD-10-CM

## 2021-02-06 DIAGNOSIS — D1800 Hemangioma unspecified site: Secondary | ICD-10-CM

## 2021-02-06 DIAGNOSIS — L918 Other hypertrophic disorders of the skin: Secondary | ICD-10-CM

## 2021-02-06 DIAGNOSIS — L80 Vitiligo: Secondary | ICD-10-CM | POA: Diagnosis not present

## 2021-02-06 HISTORY — DX: Other benign neoplasm of skin, unspecified: D23.9

## 2021-02-06 MED ORDER — OPZELURA 1.5 % EX CREA: 1.0000 "application " | TOPICAL_CREAM | Freq: Every day | CUTANEOUS | 3 refills | Status: AC

## 2021-02-06 NOTE — Progress Notes (Signed)
Follow-Up Visit   Subjective  Barbara Frederick is a 50 y.o. female who presents for the following: Annual Exam (Patient has lesions on her forehead that she would like checked today.). The patient presents for Total-Body Skin Exam (TBSE) for skin cancer screening and mole check.  The following portions of the chart were reviewed this encounter and updated as appropriate:   Tobacco  Allergies  Meds  Problems  Med Hx  Surg Hx  Fam Hx     Review of Systems:  No other skin or systemic complaints except as noted in HPI or Assessment and Plan.  Objective  Well appearing patient in no apparent distress; mood and affect are within normal limits.  A full examination was performed including scalp, head, eyes, ears, nose, lips, neck, chest, axillae, abdomen, back, buttocks, bilateral upper extremities, bilateral lower extremities, hands, feet, fingers, toes, fingernails, and toenails. All findings within normal limits unless otherwise noted below.  L buttocks Firm pink/brown papulenodule with dimple sign.   Axillary area Depigmented patches.   R lat infra mammary 0.6 cm irregular brown macule.       Assessment & Plan  Dermatofibroma L buttocks Benign growth possibly related to trauma, such as an insect bite.  Discussed removal (shave vrs excision), resulting scar and risk of recurrence. Since not bothersome, will observe for now.  Vitiligo Axillary area Start Opzelura to aa's QD PRN.   Reviewed chronic nature, no cure and can be difficult to treat.  Vitiligo is an autoimmune condition which causes loss of skin pigment and is commonly seen on the face and may also involve areas of trauma like hands, elbows, knees, and ankles.  Treatments include topical steroids and other topical anti-inflammatory ointments/creams and topical and oral Jak inhibitors.  Sometimes narrow band UV light therapy or Xtrac laser is helpful, both of which require twice weekly treatments for at least 3-6  months.  Antioxidant vitamins, such as Vitamins A,C,E,D, Folic Acid and B12 may be added to enhance treatment.  Ruxolitinib Phosphate (OPZELURA) 1.5 % CREA - Axillary area Apply 1 application topically daily. For vitiligo under the arms apply once daily.  Neoplasm of uncertain behavior of skin R lat infra mammary Epidermal / dermal shaving Lesion diameter (cm):  0.6 Informed consent: discussed and consent obtained   Timeout: patient name, date of birth, surgical site, and procedure verified   Procedure prep:  Patient was prepped and draped in usual sterile fashion Prep type:  Isopropyl alcohol Anesthesia: the lesion was anesthetized in a standard fashion   Anesthetic:  1% lidocaine w/ epinephrine 1-100,000 buffered w/ 8.4% NaHCO3 Instrument used: flexible razor blade   Hemostasis achieved with: pressure, aluminum chloride and electrodesiccation   Outcome: patient tolerated procedure well   Post-procedure details: sterile dressing applied and wound care instructions given   Dressing type: bandage and petrolatum    Specimen 1 - Surgical pathology Differential Diagnosis: D48.5 r/o dyplastic nevus  Check Margins: No  Skin cancer screening  Lentigines - Scattered tan macules - Due to sun exposure - Benign-appearing, observe - Recommend daily broad spectrum sunscreen SPF 30+ to sun-exposed areas, reapply every 2 hours as needed. - Call for any changes  Seborrheic Keratoses - Stuck-on, waxy, tan-brown papules and/or plaques  - Benign-appearing - Discussed benign etiology and prognosis. - Observe - Call for any changes  Melanocytic Nevi - Tan-brown and/or pink-flesh-colored symmetric macules and papules - Benign appearing on exam today - Observation - Call clinic for new or changing moles -  Recommend daily use of broad spectrum spf 30+ sunscreen to sun-exposed areas.   Hemangiomas - Red papules - Discussed benign nature - Observe - Call for any changes  Actinic  Damage - Chronic condition, secondary to cumulative UV/sun exposure - diffuse scaly erythematous macules with underlying dyspigmentation - Recommend daily broad spectrum sunscreen SPF 30+ to sun-exposed areas, reapply every 2 hours as needed.  - Staying in the shade or wearing long sleeves, sun glasses (UVA+UVB protection) and wide brim hats (4-inch brim around the entire circumference of the hat) are also recommended for sun protection.  - Call for new or changing lesions.  Acrochordons (Skin Tags) - Fleshy, skin-colored pedunculated papules - Benign appearing.  - Observe. - If desired, they can be removed with an in office procedure that is not covered by insurance. - Please call the clinic if you notice any new or changing lesions.  Skin cancer screening performed today.  Return if symptoms worsen or fail to improve.  Luther Redo, CMA, am acting as scribe for Sarina Ser, MD . Documentation: I have reviewed the above documentation for accuracy and completeness, and I agree with the above.  Sarina Ser, MD

## 2021-02-06 NOTE — Patient Instructions (Addendum)

## 2021-02-11 ENCOUNTER — Encounter: Payer: Self-pay | Admitting: Dermatology

## 2021-02-14 ENCOUNTER — Telehealth: Payer: Self-pay

## 2021-02-14 NOTE — Telephone Encounter (Signed)
-----   Message from Ralene Bathe, MD sent at 02/08/2021  2:57 PM EST ----- Diagnosis Skin , right lat inframammary DYSPLASTIC COMPOUND NEVUS WITH MILD ATYPIA, DEEP MARGIN INVOLVED  Mild dysplastic Recheck no later than 1 year Schedule pt 1 year appt.

## 2021-02-14 NOTE — Telephone Encounter (Signed)
Advised patient of results/hd  

## 2021-02-23 ENCOUNTER — Telehealth: Payer: No Typology Code available for payment source | Admitting: Nurse Practitioner

## 2021-02-23 DIAGNOSIS — J0101 Acute recurrent maxillary sinusitis: Secondary | ICD-10-CM | POA: Diagnosis not present

## 2021-02-23 MED ORDER — PREDNISONE 20 MG PO TABS: 40.0000 mg | ORAL_TABLET | Freq: Every day | ORAL | 0 refills | Status: AC

## 2021-02-23 MED ORDER — AMOXICILLIN-POT CLAVULANATE 875-125 MG PO TABS
1.0000 | ORAL_TABLET | Freq: Two times a day (BID) | ORAL | 0 refills | Status: AC
Start: 2021-02-23 — End: ?

## 2021-02-23 NOTE — Progress Notes (Signed)
E-Visit for Sinus Problems  We are sorry that you are not feeling well.  Here is how we plan to help!  Looks like you were treated in October for samthing. I will treat you today but if reoccurs within the next 30 days you wll need ot see your PCP. Based on what you have shared with me it looks like you have sinusitis.  Sinusitis is inflammation and infection in the sinus cavities of the head.  Based on your presentation I believe you most likely have Acute Bacterial Sinusitis.  This is an infection caused by bacteria and is treated with antibiotics. I have prescribed Augmentin 875mg /125mg  one tablet twice daily with food, for 7 days. And prednisone 20mg  2 tablets at same time daily for 5 days.You may use an oral decongestant such as Mucinex D or if you have glaucoma or high blood pressure use plain Mucinex. Saline nasal spray help and can safely be used as often as needed for congestion.  If you develop worsening sinus pain, fever or notice severe headache and vision changes, or if symptoms are not better after completion of antibiotic, please schedule an appointment with a health care provider.    Sinus infections are not as easily transmitted as other respiratory infection, however we still recommend that you avoid close contact with loved ones, especially the very young and elderly.  Remember to wash your hands thoroughly throughout the day as this is the number one way to prevent the spread of infection!  Home Care: Only take medications as instructed by your medical team. Complete the entire course of an antibiotic. Do not take these medications with alcohol. A steam or ultrasonic humidifier can help congestion.  You can place a towel over your head and breathe in the steam from hot water coming from a faucet. Avoid close contacts especially the very young and the elderly. Cover your mouth when you cough or sneeze. Always remember to wash your hands.  Get Help Right Away If: You develop  worsening fever or sinus pain. You develop a severe head ache or visual changes. Your symptoms persist after you have completed your treatment plan.  Make sure you Understand these instructions. Will watch your condition. Will get help right away if you are not doing well or get worse.  Thank you for choosing an e-visit.  Your e-visit answers were reviewed by a board certified advanced clinical practitioner to complete your personal care plan. Depending upon the condition, your plan could have included both over the counter or prescription medications.  Please review your pharmacy choice. Make sure the pharmacy is open so you can pick up prescription now. If there is a problem, you may contact your provider through [employer] and have the prescription routed to another pharmacy.  Your safety is important to [place]. If you have drug allergies check your prescription carefully.   For the next 24 hours you can use MyChart to ask questions about today's visit, request a non-urgent call back, or ask for a work or school excuse. You will get an email in the next two days asking about your experience. I hope that your e-visit has been valuable and will speed your recovery.  5-10 minutes spent reviewing and documenting in chart.

## 2021-03-07 ENCOUNTER — Ambulatory Visit: Payer: Self-pay | Admitting: *Deleted

## 2021-03-07 NOTE — Telephone Encounter (Signed)
That's fine, but i'm out until Friday. She can always cancel if she completely better by then.

## 2021-03-07 NOTE — Telephone Encounter (Signed)
Is it okay if I take one of your same day appointment?  Thanks,   -Mickel Baas

## 2021-03-07 NOTE — Telephone Encounter (Signed)
Call back from patient , no available appt noted for tomorrow. Call transferred to clinic and patient able to speak with Mickel Baas and appt scheduled for 03/09/21 at 53 am. Patient reports rectal bleeding is getting better but wants a colonoscopy. Patient verbalized understanding if symptoms worsen to go to UC .

## 2021-03-07 NOTE — Telephone Encounter (Signed)
Apt scheduled for 03/09/2021 at 10:40am   Pt advised.   Thanks,   -Mickel Baas

## 2021-03-07 NOTE — Telephone Encounter (Signed)
Tried calling patient. Left message to call back. OK for [facility] triage to advise patient and schedule appointment. Phone call may need to be transferred to the office to schedule work in appointment if triage nurse is not able to do so.

## 2021-03-07 NOTE — Telephone Encounter (Signed)
Patient reports she started abdominal cramping and diarrhea with blood late Sunday/Monday. At some points patient was passing only blood. Patient believes she had intestinal virus- she states she is getting better and stool was loose today with minimal blood. Patient reports her husband is having symptoms now. Patient declines UC - wants appointment in office. Chief Complaint: rectal bleeding Symptoms: rectal bleeding,stomach cramps Frequency: every stool Pertinent Negatives: Patient denies fever, dizziness,vomiting Disposition: [] ED /[] Urgent Care (no appt availability in office) / [] Appointment(In office/virtual)/ []  [facility]/ [] Home Care/ [x] Refused Recommended Disposition  Additional Notes: Call to office- no open appointment- patient advised UC. Patient declines-states she is seeing less blood- request office appointment this week.

## 2021-03-07 NOTE — Telephone Encounter (Signed)
Reason for Disposition  MODERATE rectal bleeding (small blood clots, passing blood without stool, or toilet water turns red)  Answer Assessment - Initial Assessment Questions 1. APPEARANCE of BLOOD: "What color is it?" "Is it passed separately, on the surface of the stool, or mixed in with the stool?"      Mixed- not sure- dark red 2. AMOUNT: "How much blood was passed?"      teaspoon 3. FREQUENCY: "How many times has blood been passed with the stools?"      Every time 4. ONSET: "When was the blood first seen in the stools?" (Days or weeks)      Sunday 5. DIARRHEA: "Is there also some diarrhea?" If Yes, ask: "How many diarrhea stools in the past 24 hours?"      Sunday-continued until yesterday- loose today 6. CONSTIPATION: "Do you have constipation?" If Yes, ask: "How bad is it?"     no 7. RECURRENT SYMPTOMS: "Have you had blood in your stools before?" If Yes, ask: "When was the last time?" and "What happened that time?"      no 8. BLOOD THINNERS: "Do you take any blood thinners?" (e.g., Coumadin/warfarin, Pradaxa/dabigatran, aspirin)     no 9. OTHER SYMPTOMS: "Do you have any other symptoms?"  (e.g., abdomen pain, vomiting, dizziness, fever)     Severe abdominal pain, chills 10. PREGNANCY: "Is there any chance you are pregnant?" "When was your last menstrual period?"       na  Protocols used: Rectal Bleeding-A-AH

## 2021-03-09 ENCOUNTER — Ambulatory Visit (INDEPENDENT_AMBULATORY_CARE_PROVIDER_SITE_OTHER): Payer: No Typology Code available for payment source | Admitting: Family Medicine

## 2021-03-09 ENCOUNTER — Encounter: Payer: Self-pay | Admitting: Family Medicine

## 2021-03-09 ENCOUNTER — Other Ambulatory Visit: Payer: Self-pay

## 2021-03-09 VITALS — BP 137/87 | HR 63 | Temp 97.8°F | Ht 67.0 in | Wt 164.0 lb

## 2021-03-09 DIAGNOSIS — K529 Noninfective gastroenteritis and colitis, unspecified: Secondary | ICD-10-CM | POA: Diagnosis not present

## 2021-03-09 NOTE — Progress Notes (Signed)
Acute Office Visit  Subjective:    Patient ID: Barbara Frederick, female    DOB: November 26, 1970, 50 y.o.   MRN: [id number]  Chief Complaint  Patient presents with   GI Problem    Started off as stomach virus noticed blood in stool for about 3 days Pt reports she feels better stated diarrhea stopped and then it was just blood in stool. Pt reports she has not seen blood today. Wednesday of this week was the last day she saw blood in her stool. Pt states she only has pressure in stomach today that radiates.   She states that she did not have a BM yesterday, but did have one today that had no blood in it and was relatively normally formes. She states she is not aware of consuming any spoiled food, but her husband had diarrhea for a few days before she did.   Past Medical History:  Diagnosis Date   Allergy    Anxiety    Arthritis    L knee- OA   Atypical mole 01/19/2015   left mid back paraspinal below bra/mild   Complication of anesthesia    needed smaller ETT (anes unable to pass 8.0 or 8.5 ETT 02/13/16; 7.0 ETT used 08/24/07)   Dysplastic nevus 02/06/2021   Right lat inframammary    Past Surgical History:  Procedure Laterality Date   ABDOMINAL HYSTERECTOMY  2010   Dr Amalia Hailey   BREAST ENHANCEMENT SURGERY Bilateral 2000   [place] [facility]   CYST EXCISION     ENDOBRONCHIAL ULTRASOUND N/A 02/13/2016   Procedure: ENDOBRONCHIAL ULTRASOUND;  Surgeon: Laverle Hobby, MD;  Location: [facility];  Service: Cardiopulmonary;  Laterality: N/A;   MEDIASTINOSCOPY N/A 03/29/2016   Procedure: Video MEDIASTINOSCOPY WITH LYMPH NODE BIOPSY;  Surgeon: Grace Isaac, MD;  Location: [facility];  Service: Thoracic;  Laterality: N/A;   OVARY SURGERY     one ovary and cervix at [facility]     twins    VIDEO BRONCHOSCOPY N/A 03/29/2016   Procedure: VIDEO BRONCHOSCOPY;  Surgeon: Grace Isaac, MD;  Location: [place];  Service: Thoracic;  Laterality: N/A;    Family History   Problem Relation Age of Onset   Cancer Paternal Grandmother 21       breast   Breast cancer Paternal Grandmother    Hypertension Mother    Raynaud syndrome Mother    Emphysema Father    Hypertension Brother    Cancer Paternal Aunt 18       breast   Breast cancer Paternal Aunt    BRCA 1/2 Paternal Aunt    Colon cancer Maternal Grandfather    Rectal cancer Maternal Grandfather    Raynaud syndrome Maternal Grandfather    Lung cancer Paternal Grandfather    Emphysema Paternal Grandfather    Pancreatic cancer Paternal Grandfather     Social History   Socioeconomic History   Marital status: Married    Spouse name: Not on file   Number of children: Not on file   Years of education: Not on file   Highest education level: Not on file  Occupational History   Not on file  Tobacco Use   Smoking status: Passive Smoke Exposure - Never Smoker   Smokeless tobacco: Never   Tobacco comments:    Parents smoked around her.   Vaping Use   Vaping Use: Never used  Substance and Sexual Activity   Alcohol use: Yes  Comment: 3-5/week, wine on the weekend    Drug use: No   Sexual activity: Not on file  Other Topics Concern   Not on file  Social History Narrative   [facility] (06/10/16):   Originally from [facility]. Previously has also lived in [place], [place], [place]. Previously has traveled to [place] a few years ago. She has had prior exposure to elephants who appeared ill. Has also traveled to [place]. Does have indoor dogs & an indoor cat. Brief bird exposure. No mold or hot tub exposure. 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 Enjoys playing tennis & skiing. She also enjoys gardening.    Social Determinants of Health   Financial Resource Strain: Not on file  Food Insecurity: Not on file  Transportation Needs: Not on file  Physical Activity: Not on file  Stress: Not on file  Social Connections: Not on file  Intimate Partner Violence: Not on  file    Outpatient Medications Prior to Visit  Medication Sig Note   fluticasone (FLONASE) 50 MCG/ACT nasal spray PLACE 2 SPRAYS INTO BOTH NOSTRILS DAILY    loratadine (CLARITIN) 10 MG tablet Take 10 mg by mouth daily as needed for allergies.    Nutritional Supplements (JUICE PLUS FIBRE PO) Take by mouth.    Ruxolitinib Phosphate (OPZELURA) 1.5 % CREA Apply 1 application topically daily. For vitiligo under the arms apply once daily.    sertraline (ZOLOFT) 50 MG tablet TAKE 1 TABLET BY MOUTH ONCE DAILY WITH BREAKFAST    sertraline (ZOLOFT) 50 MG tablet TAKE 1 TABLET BY MOUTH DAILY WITH BREAKFAST    ALPRAZolam (XANAX) 0.5 MG tablet Take 1 tablet (0.5 mg total) by mouth 2 (two) times daily as needed for anxiety. Reported on 04/05/2015 (Patient not taking: Reported on 10/10/2020)    amoxicillin-clavulanate (AUGMENTIN) 875-125 MG tablet Take 1 tablet by mouth 2 (two) times daily. (Patient not taking: Reported on 03/09/2021)    Barberry-Oreg Grape-Goldenseal (BERBERINE COMPLEX PO) Take 200 mg by mouth daily. (Patient not taking: Reported on 03/09/2021)    buPROPion (WELLBUTRIN XL) 300 MG 24 hr tablet Take 1 tablet (300 mg total) by mouth daily. (Patient not taking: No sig reported) 03/09/2021: 100 mg twice a day   Cyanocobalamin (VITAMIN B-12 PO) Take by mouth. (Patient not taking: Reported on 03/09/2021)    sertraline (ZOLOFT) 50 MG tablet TAKE 1 TABLET BY MOUTH DAILY WITH BREAKFAST    traZODone (DESYREL) 50 MG tablet TAKE 1 TABLET BY MOUTH AT BEDTIME AS NEEDED (Patient not taking: Reported on 03/09/2021)    No facility-administered medications prior to visit.      Allergies  Allergen Reactions   Codeine Itching    itching    Review of Systems  Constitutional: Negative.   HENT: Negative.    Cardiovascular: Negative.   Gastrointestinal:  Positive for abdominal pain (pressure) and blood in stool (resolved). Negative for abdominal distention, anal bleeding, constipation, diarrhea, nausea,  rectal pain and vomiting.  Endocrine: Negative.   Genitourinary: Negative.   Musculoskeletal: Negative.   Skin: Negative.   Allergic/Immunologic: Negative.   Neurological: Negative.   Hematological: Negative.   Psychiatric/Behavioral: Negative.        Objective:    Physical Exam  BP 137/87 (BP Location: Right Arm, Patient Position: Sitting, Cuff Size: Normal)    Pulse 63    Temp 97.8 F (36.6 C) (Oral)    Ht 5' 7" (1.702 m)    Wt 164 lb (74.4 kg)  SpO2 100%    BMI 25.69 kg/m    General appearance:  Well developed, well nourished female, cooperative and in no acute distress Head: Normocephalic, without obvious abnormality, atraumatic Respiratory: Respirations even and unlabored, normal respiratory rate Extremities: All extremities are intact.  Skin: Skin color, texture, turgor normal. No rashes seen  Psych: Appropriate mood and affect. Neurologic: Mental status: Alert, oriented to person, place, and time, thought content appropriate.      Assessment & Plan:   1. Enteritis Likely viral, symptoms now resolved. Advised to push fluids and advance diet as tolerated. Avoid dairy for 3-7 days. Call if symptoms return.   The entirety of the information documented in the History of Present Illness, Review of Systems and Physical Exam were personally obtained by me. Portions of this information were initially documented by the CMA and reviewed by me for thoroughness and accuracy.

## 2021-03-13 ENCOUNTER — Ambulatory Visit: Payer: No Typology Code available for payment source | Admitting: Dermatology

## 2021-04-18 ENCOUNTER — Other Ambulatory Visit: Payer: Self-pay

## 2021-04-18 DIAGNOSIS — K219 Gastro-esophageal reflux disease without esophagitis: Secondary | ICD-10-CM | POA: Diagnosis not present

## 2021-04-18 DIAGNOSIS — R1310 Dysphagia, unspecified: Secondary | ICD-10-CM | POA: Diagnosis not present

## 2021-04-18 MED ORDER — OMEPRAZOLE 40 MG PO CPDR
40.0000 mg | DELAYED_RELEASE_CAPSULE | Freq: Every day | ORAL | 3 refills | Status: DC
  Filled 2021-04-18: qty 90, 90d supply, fill #0
  Filled 2021-07-17: qty 90, 90d supply, fill #1
  Filled 2021-10-09: qty 100, 100d supply, fill #2
  Filled 2022-01-12: qty 80, 80d supply, fill #3

## 2021-04-19 ENCOUNTER — Other Ambulatory Visit: Payer: Self-pay

## 2021-04-26 DIAGNOSIS — F330 Major depressive disorder, recurrent, mild: Secondary | ICD-10-CM | POA: Diagnosis not present

## 2021-04-26 DIAGNOSIS — F411 Generalized anxiety disorder: Secondary | ICD-10-CM | POA: Diagnosis not present

## 2021-05-28 ENCOUNTER — Other Ambulatory Visit: Payer: Self-pay

## 2021-05-28 ENCOUNTER — Encounter: Payer: Self-pay | Admitting: Dermatology

## 2021-05-28 ENCOUNTER — Ambulatory Visit (INDEPENDENT_AMBULATORY_CARE_PROVIDER_SITE_OTHER): Payer: BC Managed Care – PPO | Admitting: Dermatology

## 2021-05-28 DIAGNOSIS — L304 Erythema intertrigo: Secondary | ICD-10-CM | POA: Diagnosis not present

## 2021-05-28 DIAGNOSIS — L821 Other seborrheic keratosis: Secondary | ICD-10-CM | POA: Diagnosis not present

## 2021-05-28 DIAGNOSIS — K130 Diseases of lips: Secondary | ICD-10-CM | POA: Diagnosis not present

## 2021-05-28 MED ORDER — HYDROCORTISONE 2.5 % EX CREA
TOPICAL_CREAM | CUTANEOUS | 3 refills | Status: DC
  Filled 2021-05-28: qty 20, 30d supply, fill #0

## 2021-05-28 MED ORDER — HYDROCORTISONE 2.5 % EX CREA: TOPICAL_CREAM | CUTANEOUS | 3 refills | Status: AC

## 2021-05-28 MED ORDER — KETOCONAZOLE 2 % EX CREA
TOPICAL_CREAM | CUTANEOUS | 3 refills | Status: DC
  Filled 2021-05-28: qty 30, 30d supply, fill #0

## 2021-05-28 MED ORDER — KETOCONAZOLE 2 % EX CREA: TOPICAL_CREAM | CUTANEOUS | 3 refills | Status: AC

## 2021-05-28 NOTE — Patient Instructions (Signed)

## 2021-05-28 NOTE — Progress Notes (Signed)
? ?  Follow-Up Visit ?  ?Subjective  ?Barbara Frederick is a 51 y.o. female who presents for the following: Rash (On the face, started in December after terrible stomach virus. In the corners of the mouth, red, burning, scaling bumps. Improving now but patient is concerned and would like to discuss tx options. She is using Petroleum jelly QHS which has improved condition.) and SK - cosmetic (On the R neck and forehead x 2 - patient would like treated today.). ? ?The following portions of the chart were reviewed this encounter and updated as appropriate:  ? Tobacco  Allergies  Meds  Problems  Med Hx  Surg Hx  Fam Hx   ?  ?Review of Systems:  No other skin or systemic complaints except as noted in HPI or Assessment and Plan. ? ?Objective  ?Well appearing patient in no apparent distress; mood and affect are within normal limits. ? ?A focused examination was performed including the face and neck. Relevant physical exam findings are noted in the Assessment and Plan. ? ?R forehead x 1, L temple x 1, L neck x 1 (3) ?Stuck-on, waxy, tan-brown papule or plaque --Discussed benign etiology and prognosis.  ? ? ?Assessment & Plan  ?Cheilitis ?B/L oral commisures ?Angular cheilitis - with Intertrigo ?Exacerbated by recent stomach virus ? ?Intertrigo is a chronic recurrent rash that occurs in skin fold areas that may be associated with friction; heat; moisture; yeast; fungus; and bacteria.  It may be exacerbated by increased movement / activity; sweating; and higher atmospheric temperature. ?Patient advised should improve but may recur in the future.  Discussed hyaluronic acid fillers in the oral commissures to improve this condition longer term and help with prevention.  Hyaluronic acid fillers need to be repeated periodically to maintain. ? ?Start HC 2.5% cream Qam 5d/wk and  ?Ketoconazole QHS 7d/wk.  ? ?May consider skin medicinals intertrigo cream in the future if above treatment not satisfactory. ? ?Related  Medications ?ketoconazole (NIZORAL) 2 % cream ?Apply to corners of the mouth QD. ?hydrocortisone 2.5 % cream ?Apply to corners of the mouth QD 5d/wk PRN. ? ?Seborrheic keratosis (3) ?R forehead x 1, L temple x 1, L neck x 1 ?Reassured benign age-related growth.   ? ?Destruction of lesion - R forehead x 1, L temple x 1, L neck x 1 ?Complexity: simple   ?Destruction method: cryotherapy   ?Informed consent: discussed and consent obtained   ?Timeout:  patient name, date of birth, surgical site, and procedure verified ?Lesion destroyed using liquid nitrogen: Yes   ?Region frozen until ice ball extended beyond lesion: Yes   ?Outcome: patient tolerated procedure well with no complications   ?Post-procedure details: wound care instructions given   ? ?Return if symptoms worsen or fail to improve. ? ?I, Rudell Cobb, CMA, am acting as scribe for Sarina Ser, MD . ?Documentation: I have reviewed the above documentation for accuracy and completeness, and I agree with the above. ? ?Sarina Ser, MD ? ? ?

## 2021-07-17 ENCOUNTER — Other Ambulatory Visit: Payer: Self-pay

## 2021-08-22 ENCOUNTER — Ambulatory Visit (INDEPENDENT_AMBULATORY_CARE_PROVIDER_SITE_OTHER): Payer: BC Managed Care – PPO | Admitting: Dermatology

## 2021-08-22 DIAGNOSIS — L814 Other melanin hyperpigmentation: Secondary | ICD-10-CM

## 2021-08-22 DIAGNOSIS — L720 Epidermal cyst: Secondary | ICD-10-CM

## 2021-08-22 DIAGNOSIS — L821 Other seborrheic keratosis: Secondary | ICD-10-CM

## 2021-08-22 NOTE — Progress Notes (Signed)
   Follow-Up Visit   Subjective  Barbara Frederick is a 51 y.o. female who presents for the following: Follow-up (2 months f/u on Sk's on her face treated with LN2 with a fair response ).  The following portions of the chart were reviewed this encounter and updated as appropriate:   Tobacco  Allergies  Meds  Problems  Med Hx  Surg Hx  Fam Hx     Review of Systems:  No other skin or systemic complaints except as noted in HPI or Assessment and Plan.  Objective  Well appearing patient in no apparent distress; mood and affect are within normal limits.  A focused examination was performed including face. Relevant physical exam findings are noted in the Assessment and Plan.  left temple Stuck-on, waxy, tan-brown papules and plaques -- Discussed benign etiology and prognosis.   face Scattered tan macules.   Left superior medial orbital rim Smooth white papule(s).    Assessment & Plan  Seborrheic keratosis -cosmetic -treated last visit.   Today she had free touch up-no charge. left temple  Destruction of lesion - left temple Complexity: simple   Destruction method: cryotherapy   Informed consent: discussed and consent obtained   Timeout:  patient name, date of birth, surgical site, and procedure verified Lesion destroyed using liquid nitrogen: Yes   Region frozen until ice ball extended beyond lesion: Yes   Outcome: patient tolerated procedure well with no complications   Post-procedure details: wound care instructions given    Lentigines face Discussed the treatment option of BBL/laser.  Typically we recommend 1-3 treatment sessions about 5-8 weeks apart for best results.  The patient's condition may require "maintenance treatments" in the future.  The fee for BBL / laser treatments is $350 per treatment session for the whole face.  A fee can be quoted for other parts of the body. Insurance typically does not pay for BBL/laser treatments and therefore the fee is an  out-of-pocket cost.   Consider Laser in the fall or winter   Milia Left superior medial orbital rim  samples of Aklief given use daily  Return in about 4 months (around 12/22/2021) for BBL for brown spots.  IMarye Round, CMA, am acting as scribe for Sarina Ser, MD .  Documentation: I have reviewed the above documentation for accuracy and completeness, and I agree with the above.  Sarina Ser, MD

## 2021-08-22 NOTE — Patient Instructions (Addendum)

## 2021-08-26 ENCOUNTER — Encounter: Payer: Self-pay | Admitting: Dermatology

## 2021-08-27 DIAGNOSIS — F330 Major depressive disorder, recurrent, mild: Secondary | ICD-10-CM | POA: Diagnosis not present

## 2021-08-27 DIAGNOSIS — F411 Generalized anxiety disorder: Secondary | ICD-10-CM | POA: Diagnosis not present

## 2021-08-28 DIAGNOSIS — R300 Dysuria: Secondary | ICD-10-CM | POA: Diagnosis not present

## 2021-10-10 ENCOUNTER — Other Ambulatory Visit: Payer: Self-pay

## 2021-12-11 DIAGNOSIS — Z01419 Encounter for gynecological examination (general) (routine) without abnormal findings: Secondary | ICD-10-CM | POA: Diagnosis not present

## 2021-12-11 DIAGNOSIS — Z1231 Encounter for screening mammogram for malignant neoplasm of breast: Secondary | ICD-10-CM | POA: Diagnosis not present

## 2021-12-11 DIAGNOSIS — Z1211 Encounter for screening for malignant neoplasm of colon: Secondary | ICD-10-CM | POA: Diagnosis not present

## 2021-12-12 ENCOUNTER — Encounter: Payer: Self-pay | Admitting: Dermatology

## 2021-12-12 ENCOUNTER — Ambulatory Visit (INDEPENDENT_AMBULATORY_CARE_PROVIDER_SITE_OTHER): Payer: BC Managed Care – PPO | Admitting: Dermatology

## 2021-12-12 DIAGNOSIS — L578 Other skin changes due to chronic exposure to nonionizing radiation: Secondary | ICD-10-CM

## 2021-12-12 DIAGNOSIS — L821 Other seborrheic keratosis: Secondary | ICD-10-CM | POA: Diagnosis not present

## 2021-12-12 DIAGNOSIS — B079 Viral wart, unspecified: Secondary | ICD-10-CM | POA: Diagnosis not present

## 2021-12-12 DIAGNOSIS — D485 Neoplasm of uncertain behavior of skin: Secondary | ICD-10-CM

## 2021-12-12 NOTE — Patient Instructions (Addendum)
Electrodesiccation and Curettage ("Scrape and Burn") Wound Care Instructions  Leave the original bandage on for 24 hours if possible.  If the bandage becomes soaked or soiled before that time, it is OK to remove it and examine the wound.  A small amount of post-operative bleeding is normal.  If excessive bleeding occurs, remove the bandage, place gauze over the site and apply continuous pressure (no peeking) over the area for 30 minutes. If this does not work, please call our clinic as soon as possible or page your doctor if it is after hours.   Once a day, cleanse the wound with soap and water. It is fine to shower. If a thick crust develops you may use a Q-tip dipped into dilute hydrogen peroxide (mix 1:1 with water) to dissolve it.  Hydrogen peroxide can slow the healing process, so use it only as needed.    After washing, apply petroleum jelly (Vaseline) or an antibiotic ointment if your doctor prescribed one for you, followed by a bandage.    For best healing, the wound should be covered with a layer of ointment at all times. If you are not able to keep the area covered with a bandage to hold the ointment in place, this may mean re-applying the ointment several times a day.  Continue this wound care until the wound has healed and is no longer open. It may take several weeks for the wound to heal and close.  Itching and mild discomfort is normal during the healing process.  If you have any discomfort, you can take Tylenol (acetaminophen) or ibuprofen as directed on the bottle. (Please do not take these if you have an allergy to them or cannot take them for another reason).  Some redness, tenderness and white or yellow material in the wound is normal healing.  If the area becomes very sore and red, or develops a thick yellow-green material (pus), it may be infected; please notify us.    Wound healing continues for up to one year following surgery. It is not unusual to experience pain in the scar  from time to time during the interval.  If the pain becomes severe or the scar thickens, you should notify the office.    A slight amount of redness in a scar is expected for the first six months.  After six months, the redness will fade and the scar will soften and fade.  The color difference becomes less noticeable with time.  If there are any problems, return for a post-op surgery check at your earliest convenience.  To improve the appearance of the scar, you can use silicone scar gel, cream, or sheets (such as Mederma or Serica) every night for up to one year. These are available over the counter (without a prescription).  Please call our office at [phone] for any questions or concerns.     Due to recent changes in healthcare laws, you may see results of your pathology and/or laboratory studies on MyChart before the doctors have had a chance to review them. We understand that in some cases there may be results that are confusing or concerning to you. Please understand that not all results are received at the same time and often the doctors may need to interpret multiple results in order to provide you with the best plan of care or course of treatment. Therefore, we ask that you please give us 2 business days to thoroughly review all your results before contacting the office for clarification. Should   we see a critical lab result, you will be contacted sooner.   If You Need Anything After Your Visit  If you have any questions or concerns for your doctor, please call our main line at [phone] and press option 4 to reach your doctor's medical assistant. If no one answers, please leave a voicemail as directed and we will return your call as soon as possible. Messages left after 4 pm will be answered the following business day.   You may also send us a message via MyChart. We typically respond to MyChart messages within 1-2 business days.  For prescription refills, please ask your  pharmacy to contact our office. Our fax number is [phone].  If you have an urgent issue when the clinic is closed that cannot wait until the next business day, you can page your doctor at the number below.    Please note that while we do our best to be available for urgent issues outside of office hours, we are not available 24/7.   If you have an urgent issue and are unable to reach us, you may choose to seek medical care at your doctor's office, retail clinic, urgent care center, or emergency room.  If you have a medical emergency, please immediately call 911 or go to the emergency department.  Pager Numbers  - Dr. Kowalski: [phone]  - Dr. Moye: [phone]  - Dr. Stewart: [phone]  In the event of inclement weather, please call our main line at [phone] for an update on the status of any delays or closures.  Dermatology Medication Tips: Please keep the boxes that topical medications come in in order to help keep track of the instructions about where and how to use these. Pharmacies typically print the medication instructions only on the boxes and not directly on the medication tubes.   If your medication is too expensive, please contact our office at [phone] option 4 or send us a message through MyChart.   We are unable to tell what your co-pay for medications will be in advance as this is different depending on your insurance coverage. However, we may be able to find a substitute medication at lower cost or fill out paperwork to get insurance to cover a needed medication.   If a prior authorization is required to get your medication covered by your insurance company, please allow us 1-2 business days to complete this process.  Drug prices often vary depending on where the prescription is filled and some pharmacies may offer cheaper prices.  The website www.goodrx.com contains coupons for medications through different pharmacies. The prices here do not  account for what the cost may be with help from insurance (it may be cheaper with your insurance), but the website can give you the price if you did not use any insurance.  - You can print the associated coupon and take it with your prescription to the pharmacy.  - You may also stop by our office during regular business hours and pick up a GoodRx coupon card.  - If you need your prescription sent electronically to a different pharmacy, notify our office through [facility] MyChart or by phone at [phone] option 4.     Si Usted Necesita Algo Despus de Su Visita  Tambin puede enviarnos un mensaje a travs de MyChart. Por lo general respondemos a los mensajes de MyChart en el transcurso de 1 a 2 das hbiles.  Para renovar recetas, por favor pida a su farmacia que se ponga en   contacto con nuestra oficina. Nuestro nmero de fax es el [phone].  Si tiene un asunto urgente cuando la clnica est cerrada y que no puede esperar hasta el siguiente da hbil, puede llamar/localizar a su doctor(a) al nmero que aparece a continuacin.   Por favor, tenga en cuenta que aunque hacemos todo lo posible para estar disponibles para asuntos urgentes fuera del horario de oficina, no estamos disponibles las 24 horas del da, los 7 das de la semana.   Si tiene un problema urgente y no puede comunicarse con nosotros, puede optar por buscar atencin mdica  en el consultorio de su doctor(a), en una clnica privada, en un centro de atencin urgente o en una sala de emergencias.  Si tiene una emergencia mdica, por favor llame inmediatamente al 911 o vaya a la sala de emergencias.  Nmeros de bper  - Dr. Kowalski: [phone]  - Dra. Moye: [phone]  - Dra. Stewart: [phone]  En caso de inclemencias del tiempo, por favor llame a nuestra lnea principal al [phone] para una actualizacin sobre el estado de cualquier retraso o cierre.  Consejos para la medicacin en dermatologa: Por  favor, guarde las cajas en las que vienen los medicamentos de uso tpico para ayudarle a seguir las instrucciones sobre dnde y cmo usarlos. Las farmacias generalmente imprimen las instrucciones del medicamento slo en las cajas y no directamente en los tubos del medicamento.   Si su medicamento es muy caro, por favor, pngase en contacto con nuestra oficina llamando al [phone] y presione la opcin 4 o envenos un mensaje a travs de MyChart.   No podemos decirle cul ser su copago por los medicamentos por adelantado ya que esto es diferente dependiendo de la cobertura de su seguro. Sin embargo, es posible que podamos encontrar un medicamento sustituto a menor costo o llenar un formulario para que el seguro cubra el medicamento que se considera necesario.   Si se requiere una autorizacin previa para que su compaa de seguros cubra su medicamento, por favor permtanos de 1 a 2 das hbiles para completar este proceso.  Los precios de los medicamentos varan con frecuencia dependiendo del lugar de dnde se surte la receta y alguna farmacias pueden ofrecer precios ms baratos.  El sitio web www.goodrx.com tiene cupones para medicamentos de diferentes farmacias. Los precios aqu no tienen en cuenta lo que podra costar con la ayuda del seguro (puede ser ms barato con su seguro), pero el sitio web puede darle el precio si no utiliz ningn seguro.  - Puede imprimir el cupn correspondiente y llevarlo con su receta a la farmacia.  - Tambin puede pasar por nuestra oficina durante el horario de atencin regular y recoger una tarjeta de cupones de GoodRx.  - Si necesita que su receta se enve electrnicamente a una farmacia diferente, informe a nuestra oficina a travs de MyChart de [facility] o por telfono llamando al [phone] y presione la opcin 4.  

## 2021-12-12 NOTE — Progress Notes (Signed)
Follow-Up Visit   Subjective  Barbara Frederick is a 51 y.o. female who presents for the following: SK  (On the L temple - previously treated with LN2, pt thinks if may have grown larger. She also has a lesion on the R abdomen that has turned a dark blackish color that she is concerned about and would like checked today).  The following portions of the chart were reviewed this encounter and updated as appropriate:   Tobacco  Allergies  Meds  Problems  Med Hx  Surg Hx  Fam Hx     Review of Systems:  No other skin or systemic complaints except as noted in HPI or Assessment and Plan.  Objective  Well appearing patient in no apparent distress; mood and affect are within normal limits.  A focused examination was performed including the face and abdomen. Relevant physical exam findings are noted in the Assessment and Plan.  L temple 0.6 cm Hyperkeratotic papule.   RLQA periumbilical 0.3 cm Dark papule.    Assessment & Plan  Neoplasm of uncertain behavior of skin (2) L temple  Epidermal / dermal shaving  Lesion diameter (cm):  0.6 Informed consent: discussed and consent obtained   Timeout: patient name, date of birth, surgical site, and procedure verified   Procedure prep:  Patient was prepped and draped in usual sterile fashion Prep type:  Isopropyl alcohol Anesthesia: the lesion was anesthetized in a standard fashion   Anesthetic:  1% lidocaine w/ epinephrine 1-100,000 buffered w/ 8.4% NaHCO3 Instrument used: flexible razor blade   Hemostasis achieved with: pressure, aluminum chloride and electrodesiccation   Outcome: patient tolerated procedure well   Post-procedure details: sterile dressing applied and wound care instructions given   Dressing type: bandage and petrolatum    Destruction of lesion Complexity: extensive   Destruction method: electrodesiccation and curettage   Informed consent: discussed and consent obtained   Timeout:  patient name, date of birth,  surgical site, and procedure verified Procedure prep:  Patient was prepped and draped in usual sterile fashion Prep type:  Isopropyl alcohol Anesthesia: the lesion was anesthetized in a standard fashion   Anesthetic:  1% lidocaine w/ epinephrine 1-100,000 buffered w/ 8.4% NaHCO3 Curettage performed in three different directions: Yes   Electrodesiccation performed over the curetted area: Yes   Lesion length (cm):  0.6 Lesion width (cm):  0.6 Margin per side (cm):  0.2 Final wound size (cm):  1 Hemostasis achieved with:  pressure, aluminum chloride and electrodesiccation Outcome: patient tolerated procedure well with no complications   Post-procedure details: sterile dressing applied and wound care instructions given   Dressing type: bandage and petrolatum    Specimen 1 - Surgical pathology Differential Diagnosis: D48.5 r/o ISK vs other  Check Margins: No  RLQA periumbilical  Epidermal / dermal shaving  Lesion diameter (cm):  0.3 Informed consent: discussed and consent obtained   Timeout: patient name, date of birth, surgical site, and procedure verified   Procedure prep:  Patient was prepped and draped in usual sterile fashion Prep type:  Isopropyl alcohol Anesthesia: the lesion was anesthetized in a standard fashion   Anesthetic:  1% lidocaine w/ epinephrine 1-100,000 buffered w/ 8.4% NaHCO3 Instrument used: flexible razor blade   Hemostasis achieved with: pressure, aluminum chloride and electrodesiccation   Outcome: patient tolerated procedure well   Post-procedure details: sterile dressing applied and wound care instructions given   Dressing type: bandage and petrolatum    Specimen 2 - Surgical pathology Differential Diagnosis: D48.5  r/o hemangioma vs other Check Margins: No  Actinic skin damage  Seborrheic keratosis  Seborrheic Keratoses - Stuck-on, waxy, tan-brown papules and/or plaques  - Benign-appearing - Discussed benign etiology and prognosis. - Observe -  Call for any changes  Actinic Damage - chronic, secondary to cumulative UV radiation exposure/sun exposure over time - diffuse scaly erythematous macules with underlying dyspigmentation - Recommend daily broad spectrum sunscreen SPF 30+ to sun-exposed areas, reapply every 2 hours as needed.  - Recommend staying in the shade or wearing long sleeves, sun glasses (UVA+UVB protection) and wide brim hats (4-inch brim around the entire circumference of the hat). - Call for new or changing lesions.  Return for appointment as scheduled.  Luther Redo, CMA, am acting as scribe for Sarina Ser, MD . Documentation: I have reviewed the above documentation for accuracy and completeness, and I agree with the above.  Sarina Ser, MD

## 2021-12-18 ENCOUNTER — Telehealth: Payer: Self-pay

## 2021-12-18 NOTE — Telephone Encounter (Signed)
-----   Message from Ralene Bathe, MD sent at 12/17/2021  5:50 PM EDT ----- Diagnosis 1. Skin , left temple VERRUCA VULGARIS, IRRITATED, BASE INVOLVED 2. Skin , RLQA periumbilical VERRUCA VULGARIS, IRRITATED  1&2 - both benign viral warts Both already treated No further treatment at this time May recur - observe

## 2021-12-18 NOTE — Telephone Encounter (Signed)
Patient informed of pathology results 

## 2022-01-03 ENCOUNTER — Ambulatory Visit: Payer: BC Managed Care – PPO | Admitting: Dermatology

## 2022-01-13 ENCOUNTER — Other Ambulatory Visit: Payer: Self-pay

## 2022-01-14 ENCOUNTER — Other Ambulatory Visit: Payer: Self-pay

## 2022-01-14 ENCOUNTER — Other Ambulatory Visit: Payer: Self-pay | Admitting: Obstetrics and Gynecology

## 2022-01-14 DIAGNOSIS — Z1231 Encounter for screening mammogram for malignant neoplasm of breast: Secondary | ICD-10-CM

## 2022-02-18 ENCOUNTER — Ambulatory Visit
Admission: RE | Admit: 2022-02-18 | Discharge: 2022-02-18 | Disposition: A | Discharge status: Home / Self Care | Payer: BC Managed Care – PPO | Source: Ambulatory Visit | Attending: Obstetrics and Gynecology | Admitting: Obstetrics and Gynecology

## 2022-02-18 DIAGNOSIS — Z1231 Encounter for screening mammogram for malignant neoplasm of breast: Secondary | ICD-10-CM | POA: Diagnosis not present

## 2022-03-04 ENCOUNTER — Telehealth: Payer: BC Managed Care – PPO | Admitting: Physician Assistant

## 2022-03-04 DIAGNOSIS — U071 COVID-19: Secondary | ICD-10-CM | POA: Diagnosis not present

## 2022-03-04 MED ORDER — BENZONATATE 100 MG PO CAPS: 100.0000 mg | ORAL_CAPSULE | Freq: Three times a day (TID) | ORAL | 0 refills | Status: AC | PRN

## 2022-03-04 MED ORDER — MOLNUPIRAVIR EUA 200MG CAPSULE: 4.0000 | ORAL_CAPSULE | Freq: Two times a day (BID) | ORAL | 0 refills | Status: AC

## 2022-03-04 NOTE — Progress Notes (Signed)
Virtual Visit Consent   Barbara Frederick, you are scheduled for a virtual visit with a [facility] provider today. Just as with appointments in the office, your consent must be obtained to participate. Your consent will be active for this visit and any virtual visit you may have with one of our providers in the next 365 days. If you have a MyChart account, a copy of this consent can be sent to you electronically.  As this is a virtual visit, video technology does not allow for your provider to perform a traditional examination. This may limit your provider's ability to fully assess your condition. If your provider identifies any concerns that need to be evaluated in person or the need to arrange testing (such as labs, EKG, etc.), we will make arrangements to do so. Although advances in technology are sophisticated, we cannot ensure that it will always work on either your end or our end. If the connection with a video visit is poor, the visit may have to be switched to a telephone visit. With either a video or telephone visit, we are not always able to ensure that we have a secure connection.  By engaging in this virtual visit, you consent to the provision of healthcare and authorize for your insurance to be billed (if applicable) for the services provided during this visit. Depending on your insurance coverage, you may receive a charge related to this service.  I need to obtain your verbal consent now. Are you willing to proceed with your visit today? [name] has provided verbal consent on 03/04/2022 for a virtual visit (video or telephone). Mar Daring, PA-C  Date: 03/04/2022 9:00 AM  Virtual Visit via Video Note   I, Mar Daring, connected with  Barbara Frederick  ([id number], 02/12/1971) on 03/04/22 at  8:45 AM EST by a video-enabled telemedicine application and verified that I am speaking with the correct person using two identifiers.  Location: Patient: Virtual Visit  Location Patient: Home Provider: Virtual Visit Location Provider: Home Office   I discussed the limitations of evaluation and management by telemedicine and the availability of in person appointments. The patient expressed understanding and agreed to proceed.    History of Present Illness: Barbara Frederick is a 51 y.o. who identifies as a female who was assigned female at birth, and is being seen today for Covid 64.  HPI: URI  This is a new problem. Episode onset: Tested positive for covid 19 on at home test; Symptoms started yesterday. The problem has been gradually worsening. There has been no fever (feels feverish without temperature). Associated symptoms include congestion, coughing, ear pain, headaches, a plugged ear sensation, rhinorrhea, sinus pain and a sore throat. Pertinent negatives include no diarrhea, nausea or vomiting. Associated symptoms comments: Back pain. She has tried acetaminophen for the symptoms. The treatment provided no relief.     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   Lymphadenopathy, mediastinal 02/02/2016   Cough 01/22/2016   Anxiety 09/02/2014   Arthritis 09/02/2014   Clinical depression 09/02/2014   Gastro-esophageal reflux disease without esophagitis 09/02/2014   Insomnia 09/02/2014   Allergic rhinitis, seasonal 09/02/2014   Breast pain, left 05/11/2014  Umbilical hernia 34/74/2595   Abdominal wall mass of periumbilical region 63/87/5643    Allergies:  Allergies  Allergen Reactions   Codeine Itching    itching   Medications:  Current Outpatient Medications:    benzonatate (TESSALON) 100 MG capsule, Take 1 capsule (100 mg  total) by mouth 3 (three) times daily as needed., Disp: 30 capsule, Rfl: 0   molnupiravir EUA (LAGEVRIO) 200 mg CAPS capsule, Take 4 capsules (800 mg total) by mouth 2 (two) times daily for 5 days., Disp: 40 capsule, Rfl: 0   ALPRAZolam (XANAX) 0.5 MG tablet, Take 1 tablet (0.5 mg total) by mouth 2 (two) times daily as needed for anxiety. Reported on 04/05/2015 (Patient not taking: Reported on 10/10/2020), Disp: 60 tablet, Rfl: 5   amoxicillin-clavulanate (AUGMENTIN) 875-125 MG tablet, Take 1 tablet by mouth 2 (two) times daily. (Patient not taking: Reported on 03/09/2021), Disp: 14 tablet, Rfl: 0   Barberry-Oreg Grape-Goldenseal (BERBERINE COMPLEX PO), Take 200 mg by mouth daily. (Patient not taking: Reported on 03/09/2021), Disp: , Rfl:    buPROPion (WELLBUTRIN XL) 300 MG 24 hr tablet, Take 1 tablet (300 mg total) by mouth daily., Disp: 30 tablet, Rfl: 12   Cyanocobalamin (VITAMIN B-12 PO), Take by mouth., Disp: , Rfl:    fluticasone (FLONASE) 50 MCG/ACT nasal spray, PLACE 2 SPRAYS INTO BOTH NOSTRILS DAILY, Disp: 16 g, Rfl: 0   hydrocortisone 2.5 % cream, Apply to corners of the mouth QD 5d/wk PRN., Disp: 20 g, Rfl: 3   ketoconazole (NIZORAL) 2 % cream, Apply to corners of the mouth QD. (Patient not taking: Reported on 12/12/2021), Disp: 30 g, Rfl: 3   loratadine (CLARITIN) 10 MG tablet, Take 10 mg by mouth daily as needed for allergies., Disp: , Rfl:    Nutritional Supplements (JUICE PLUS FIBRE PO), Take by mouth., Disp: , Rfl:    omeprazole (PRILOSEC) 40 MG capsule, Take one capsule by mouth daily, Disp: 90 capsule, Rfl: 3   Ruxolitinib Phosphate (OPZELURA) 1.5 % CREA, Apply 1 application topically daily. For vitiligo under the arms apply once daily. (Patient not taking: Reported on 12/12/2021), Disp: 60 g, Rfl: 3   sertraline (ZOLOFT) 50 MG tablet, TAKE 1 TABLET BY MOUTH ONCE DAILY WITH BREAKFAST, Disp: 90 tablet, Rfl: 0   sertraline (ZOLOFT) 50 MG tablet, TAKE 1 TABLET BY MOUTH DAILY WITH BREAKFAST,  Disp: 90 tablet, Rfl: 0   sertraline (ZOLOFT) 50 MG tablet, TAKE 1 TABLET BY MOUTH DAILY WITH BREAKFAST, Disp: 30 tablet, Rfl: 0   traZODone (DESYREL) 50 MG tablet, TAKE 1 TABLET BY MOUTH AT BEDTIME AS NEEDED (Patient not taking: Reported on 03/09/2021), Disp: 30 tablet, Rfl: 0   Observations/Objective: Patient is well-developed, well-nourished in no acute distress.  Resting comfortably at home.  Head is normocephalic, atraumatic.  No labored breathing.  Speech is clear and coherent with logical content.  Patient is alert and oriented at baseline.    Assessment and Plan: 1. COVID-19 - molnupiravir EUA (LAGEVRIO) 200 mg CAPS capsule; Take 4 capsules (800 mg total) by mouth 2 (two) times daily for 5 days.  Dispense: 40 capsule; Refill: 0 - benzonatate (TESSALON) 100 MG capsule; Take 1 capsule (100 mg total) by mouth 3 (three) times daily as needed.  Dispense: 30 capsule; Refill: 0  - Continue OTC symptomatic management of choice - Will send OTC vitamins and supplement information through AVS - Molnupiravir prescribed - Patient enrolled in MyChart symptom monitoring - Push fluids - Rest as needed - Discussed return  precautions and when to seek in-person evaluation, sent via AVS as well   Follow Up Instructions: I discussed the assessment and treatment plan with the patient. The patient was provided an opportunity to ask questions and all were answered. The patient agreed with the plan and demonstrated an understanding of the instructions.  A copy of instructions were sent to the patient via MyChart unless otherwise noted below.    The patient was advised to call back or seek an in-person evaluation if the symptoms worsen or if the condition fails to improve as anticipated.  Time:  I spent 12 minutes with the patient via telehealth technology discussing the above problems/concerns.    Mar Daring, PA-C

## 2022-03-04 NOTE — Patient Instructions (Signed)
Barbara Frederick, thank you for joining Barbara Daring, PA-C for today's virtual visit.  While this provider is not your primary care provider (PCP), if your PCP is located in our provider database this encounter information will be shared with them immediately following your visit.   [place] account gives you access to today's visit and all your visits, tests, and labs performed at [facility] " click here if you don't have a [facility] account or go to [email or web address]  Consent: (Patient) Barbara Frederick provided verbal consent for this virtual visit at the beginning of the encounter.  Current Medications:  Current Outpatient Medications:    benzonatate (TESSALON) 100 MG capsule, Take 1 capsule (100 mg total) by mouth 3 (three) times daily as needed., Disp: 30 capsule, Rfl: 0   molnupiravir EUA (LAGEVRIO) 200 mg CAPS capsule, Take 4 capsules (800 mg total) by mouth 2 (two) times daily for 5 days., Disp: 40 capsule, Rfl: 0   ALPRAZolam (XANAX) 0.5 MG tablet, Take 1 tablet (0.5 mg total) by mouth 2 (two) times daily as needed for anxiety. Reported on 04/05/2015 (Patient not taking: Reported on 10/10/2020), Disp: 60 tablet, Rfl: 5   amoxicillin-clavulanate (AUGMENTIN) 875-125 MG tablet, Take 1 tablet by mouth 2 (two) times daily. (Patient not taking: Reported on 03/09/2021), Disp: 14 tablet, Rfl: 0   Barberry-Oreg Grape-Goldenseal (BERBERINE COMPLEX PO), Take 200 mg by mouth daily. (Patient not taking: Reported on 03/09/2021), Disp: , Rfl:    buPROPion (WELLBUTRIN XL) 300 MG 24 hr tablet, Take 1 tablet (300 mg total) by mouth daily., Disp: 30 tablet, Rfl: 12   Cyanocobalamin (VITAMIN B-12 PO), Take by mouth., Disp: , Rfl:    fluticasone (FLONASE) 50 MCG/ACT nasal spray, PLACE 2 SPRAYS INTO BOTH NOSTRILS DAILY, Disp: 16 g, Rfl: 0   hydrocortisone 2.5 % cream, Apply to corners of the mouth QD 5d/wk PRN., Disp: 20 g, Rfl: 3   ketoconazole (NIZORAL) 2 %  cream, Apply to corners of the mouth QD. (Patient not taking: Reported on 12/12/2021), Disp: 30 g, Rfl: 3   loratadine (CLARITIN) 10 MG tablet, Take 10 mg by mouth daily as needed for allergies., Disp: , Rfl:    Nutritional Supplements (JUICE PLUS FIBRE PO), Take by mouth., Disp: , Rfl:    omeprazole (PRILOSEC) 40 MG capsule, Take one capsule by mouth daily, Disp: 90 capsule, Rfl: 3   Ruxolitinib Phosphate (OPZELURA) 1.5 % CREA, Apply 1 application topically daily. For vitiligo under the arms apply once daily. (Patient not taking: Reported on 12/12/2021), Disp: 60 g, Rfl: 3   sertraline (ZOLOFT) 50 MG tablet, TAKE 1 TABLET BY MOUTH ONCE DAILY WITH BREAKFAST, Disp: 90 tablet, Rfl: 0   sertraline (ZOLOFT) 50 MG tablet, TAKE 1 TABLET BY MOUTH DAILY WITH BREAKFAST, Disp: 90 tablet, Rfl: 0   sertraline (ZOLOFT) 50 MG tablet, TAKE 1 TABLET BY MOUTH DAILY WITH BREAKFAST, Disp: 30 tablet, Rfl: 0   traZODone (DESYREL) 50 MG tablet, TAKE 1 TABLET BY MOUTH AT BEDTIME AS NEEDED (Patient not taking: Reported on 03/09/2021), Disp: 30 tablet, Rfl: 0   Medications ordered in this encounter:  Meds ordered this encounter  Medications   molnupiravir EUA (LAGEVRIO) 200 mg CAPS capsule    Sig: Take 4 capsules (800 mg total) by mouth 2 (two) times daily for 5 days.    Dispense:  40 capsule    Refill:  0    Order Specific Question:   Supervising Provider  Answer:   Chase Picket [[id number]]   benzonatate (TESSALON) 100 MG capsule    Sig: Take 1 capsule (100 mg total) by mouth 3 (three) times daily as needed.    Dispense:  30 capsule    Refill:  0    Order Specific Question:   Supervising Provider    Answer:   Chase Picket [id number]     *If you need refills on other medications prior to your next appointment, please contact your pharmacy*  Follow-Up: Call back or seek an in-person evaluation if the symptoms worsen or if the condition fails to improve as anticipated.  [facility] [phone]  Other Instructions  Can take to lessen severity: Vit C '500mg'$  twice daily Quercertin 250-'500mg'$  twice daily Zinc 75-'100mg'$  daily Melatonin 3-6 mg at bedtime Vit D3 1000-2000 IU daily Aspirin 81 mg daily with food Optional: Famotidine '20mg'$  daily Also can add tylenol/ibuprofen as needed for fevers and body aches May add Mucinex or Mucinex DM as needed for cough/congestion   COVID-19 COVID-19, or coronavirus disease 2019, is an infection that is caused by a new (novel) coronavirus called SARS-CoV-2. COVID-19 can cause many symptoms. In some people, the virus may not cause any symptoms. In others, it may cause mild or severe symptoms. Some people with severe infection develop severe disease. What are the causes? This illness is caused by a virus. The virus may be in the air as tiny specks of fluid (aerosols) or droplets, or it may be on surfaces. You may catch the virus by: Breathing in droplets from an infected person. Droplets can be spread by a person breathing, speaking, singing, coughing, or sneezing. Touching something, like a table or a doorknob, that has virus on it (is contaminated) and then touching your mouth, nose, or eyes. What increases the risk? Risk for infection: You are more likely to get infected with the COVID-19 virus if: You are within 6 ft (1.8 m) of a person with COVID-19 for 15 minutes or longer. You are providing care for a person who is infected with COVID-19. You are in close personal contact with other people. Close personal contact includes hugging, kissing, or sharing eating or drinking utensils. Risk for serious illness caused by COVID-19: You are more likely to get seriously ill from the COVID-19 virus if: You have cancer. You have a long-term (chronic) disease, such as: Chronic lung disease. This includes pulmonary embolism, chronic obstructive pulmonary disease, and cystic fibrosis. Long-term disease that lowers your body's ability to fight  infection (immunocompromise). Serious cardiac conditions, such as heart failure, coronary artery disease, or cardiomyopathy. Diabetes. Chronic kidney disease. Liver diseases. These include cirrhosis, nonalcoholic fatty liver disease, alcoholic liver disease, or autoimmune hepatitis. You have obesity. You are pregnant or were recently pregnant. You have sickle cell disease. What are the signs or symptoms? Symptoms of this condition can range from mild to severe. Symptoms may appear any time from 2 to 14 days after being exposed to the virus. They include: Fever or chills. Shortness of breath or trouble breathing. Feeling tired or very tired. Headaches, body aches, or muscle aches. Runny or stuffy nose, sneezing, coughing, or sore throat. New loss of taste or smell. This is rare. Some people may also have stomach problems, such as nausea, vomiting, or diarrhea. Other people may not have any symptoms of COVID-19. How is this diagnosed? This condition may be diagnosed by testing samples to check for the COVID-19 virus. The most common tests are  the PCR test and the antigen test. Tests may be done in the lab or at home. They include: Using a swab to take a sample of fluid from the back of your nose and throat (nasopharyngeal fluid), from your nose, or from your throat. Testing a sample of saliva from your mouth. Testing a sample of coughed-up mucus from your lungs (sputum). How is this treated? Treatment for COVID-19 infection depends on the severity of the condition. Mild symptoms can be managed at home with rest, fluids, and over-the-counter medicines. Serious symptoms may be treated in a hospital intensive care unit (ICU). Treatment in the ICU may include: Supplemental oxygen. Extra oxygen is given through a tube in the nose, a face mask, or a hood. Medicines. These may include: Antivirals, such as monoclonal antibodies. These help your body fight off certain viruses that can cause  disease. Anti-inflammatories, such as corticosteroids. These reduce inflammation and suppress the immune system. Antithrombotics. These prevent or treat blood clots, if they develop. Convalescent plasma. This helps boost your immune system, if you have an underlying immunosuppressive condition or are getting immunosuppressive treatments. Prone positioning. This means you will lie on your stomach. This helps oxygen to get into your lungs. Infection control measures. If you are at risk for more serious illness caused by COVID-19, your health care provider may prescribe two long-acting monoclonal antibodies, given together every 6 months. How is this prevented? To protect yourself: Use preventive medicine (pre-exposure prophylaxis). You may get pre-exposure prophylaxis if you have moderate or severe immunocompromise. Get vaccinated. Anyone 39 months old or older who meets guidelines can get a COVID-19 vaccine or vaccine series. This includes people who are pregnant or making breast milk (lactating). Get an added dose of COVID-19 vaccine after your first vaccine or vaccine series if you have moderate to severe immunocompromise. This applies if you have had a solid organ transplant or have been diagnosed with an immunocompromising condition. You should get the added dose 4 weeks after you got the first COVID-19 vaccine or vaccine series. If you get an mRNA vaccine, you will need a 3-dose primary series. If you get the J&J/Janssen vaccine, you will need a 2-dose primary series, with the second dose being an mRNA vaccine. Talk to your health care provider about getting experimental monoclonal antibodies. This treatment is approved under emergency use authorization to prevent severe illness before or after being exposed to the COVID-19 virus. You may be given monoclonal antibodies if: You have moderate or severe immunocompromise. This includes treatments that lower your immune response. People with  immunocompromise may not develop protection against COVID-19 when they are vaccinated. You cannot be vaccinated. You may not get a vaccine if you have a severe allergic reaction to the vaccine or its components. You are not fully vaccinated. You are in a facility where COVID-19 is present and: Are in close contact with a person who is infected with the COVID-19 virus. Are at high risk of being exposed to the COVID-19 virus. You are at risk of illness from new variants of the COVID-19 virus. To protect others: If you have symptoms of COVID-19, take steps to prevent the virus from spreading to others. Stay home. Leave your house only to get medical care. Do not use public transit, if possible. Do not travel while you are sick. Wash your hands often with soap and water for at least 20 seconds. If soap and water are not available, use alcohol-based hand sanitizer. Make sure that all people in  your household wash their hands well and often. Cough or sneeze into a tissue or your sleeve or elbow. Do not cough or sneeze into your hand or into the air. Where to find more information Centers for Disease Control and Prevention: CharmCourses.be World Health Organization: https://www.castaneda.info/ Get help right away if: You have trouble breathing. You have pain or pressure in your chest. You are confused. You have bluish lips and fingernails. You have trouble waking from sleep. You have symptoms that get worse. These symptoms may be an emergency. Get help right away. Call 911. Do not wait to see if the symptoms will go away. Do not drive yourself to the hospital. Summary COVID-19 is an infection that is caused by a new coronavirus. Sometimes, there are no symptoms. Other times, symptoms range from mild to severe. Some people with a severe COVID-19 infection develop severe disease. The virus that causes COVID-19 can spread from person to person through droplets or aerosols from  breathing, speaking, singing, coughing, or sneezing. Mild symptoms of COVID-19 can be managed at home with rest, fluids, and over-the-counter medicines. This information is not intended to replace advice given to you by your health care provider. Make sure you discuss any questions you have with your health care provider. Document Revised: 02/27/2021 Document Reviewed: 03/01/2021 Elsevier Patient Education  [place].    If you have been instructed to have an in-person evaluation today at a local Urgent Care facility, please use the link below. It will take you to a list of all of our available [facility], including address, phone number and hours of operation. Please do not delay care.  [facility]  If you or a family member do not have a primary care provider, use the link below to schedule a visit and establish care. When you choose a [facility] primary care physician or advanced practice provider, you gain a long-term partner in health. Find a Primary Care Provider  Learn more about [facility]'s in-office and virtual care options: [facility]

## 2022-04-04 ENCOUNTER — Other Ambulatory Visit: Payer: Self-pay

## 2022-04-05 ENCOUNTER — Other Ambulatory Visit: Payer: Self-pay

## 2022-04-05 MED ORDER — OMEPRAZOLE 40 MG PO CPDR
40.0000 mg | DELAYED_RELEASE_CAPSULE | Freq: Every day | ORAL | 0 refills | Status: AC
  Filled 2022-04-05: qty 30, 30d supply, fill #0

## 2022-04-29 DIAGNOSIS — D861 Sarcoidosis of lymph nodes: Secondary | ICD-10-CM | POA: Diagnosis not present

## 2022-04-29 DIAGNOSIS — R590 Localized enlarged lymph nodes: Secondary | ICD-10-CM | POA: Diagnosis not present

## 2022-04-29 DIAGNOSIS — R0602 Shortness of breath: Secondary | ICD-10-CM | POA: Diagnosis not present

## 2022-04-30 DIAGNOSIS — F411 Generalized anxiety disorder: Secondary | ICD-10-CM | POA: Diagnosis not present

## 2022-04-30 DIAGNOSIS — F330 Major depressive disorder, recurrent, mild: Secondary | ICD-10-CM | POA: Diagnosis not present

## 2022-05-14 ENCOUNTER — Other Ambulatory Visit: Payer: Self-pay

## 2022-05-14 DIAGNOSIS — M791 Myalgia, unspecified site: Secondary | ICD-10-CM | POA: Diagnosis not present

## 2022-05-14 DIAGNOSIS — Z87891 Personal history of nicotine dependence: Secondary | ICD-10-CM | POA: Diagnosis not present

## 2022-05-14 DIAGNOSIS — R0789 Other chest pain: Secondary | ICD-10-CM | POA: Diagnosis not present

## 2022-05-14 DIAGNOSIS — R509 Fever, unspecified: Secondary | ICD-10-CM | POA: Diagnosis not present

## 2022-05-14 DIAGNOSIS — Z1152 Encounter for screening for COVID-19: Secondary | ICD-10-CM | POA: Diagnosis not present

## 2022-05-14 DIAGNOSIS — R21 Rash and other nonspecific skin eruption: Secondary | ICD-10-CM | POA: Diagnosis not present

## 2022-05-14 DIAGNOSIS — R000 Tachycardia, unspecified: Secondary | ICD-10-CM | POA: Diagnosis not present

## 2022-05-14 DIAGNOSIS — D861 Sarcoidosis of lymph nodes: Secondary | ICD-10-CM | POA: Diagnosis not present

## 2022-05-14 DIAGNOSIS — R079 Chest pain, unspecified: Secondary | ICD-10-CM | POA: Diagnosis not present

## 2022-05-14 DIAGNOSIS — B349 Viral infection, unspecified: Secondary | ICD-10-CM | POA: Diagnosis not present

## 2022-05-14 DIAGNOSIS — R059 Cough, unspecified: Secondary | ICD-10-CM | POA: Diagnosis not present

## 2022-05-14 DIAGNOSIS — R519 Headache, unspecified: Secondary | ICD-10-CM | POA: Diagnosis not present

## 2022-07-12 ENCOUNTER — Encounter: Payer: Self-pay | Admitting: Gastroenterology

## 2022-07-18 NOTE — H&P (Signed)
Pre-Procedure H&P   Patient ID: Barbara Frederick is a 52 y.o. female.  Gastroenterology Provider: Jaynie Collins, DO  Referring Provider: Dr. Dalbert Garnet PCP: Bosie Clos, MD  Date: 07/19/2022  HPI Barbara Frederick is a 52 y.o. female who presents today for Colonoscopy for Colorectal cancer screening .  Undergoing initial screening colonoscopy.  Reports daily bowel movement without melena or hematochezia. However, more recently she was started on magnesium and had multiple loose stools in a day. She continues to have this issue despite starting magnesium. Also recently started losartan. Has been on sertraline long term. Omeprazole prn.  Her Cologuard was negative in June 2021.  Maternal grandfather with history of colon cancer.  No family history of polyps.  Patient is status post hysterectomy. Hemoglobin 14 MCV 91 platelets 222,000 creatinine 0.8  In the past she has needed a 7.0 ET tube for intubation where 8.0 and 8.5 did not work   Past Medical History:  Diagnosis Date   Allergy    Anxiety    Arthritis    L knee- OA   Atypical mole 01/19/2015   left mid back paraspinal below bra/mild   Complication of anesthesia    needed smaller ETT (anes unable to pass 8.0 or 8.5 ETT 02/13/16; 7.0 ETT used 08/24/07)   Dysplastic nevus 02/06/2021   Right lat inframammary    Past Surgical History:  Procedure Laterality Date   ABDOMINAL HYSTERECTOMY  2010   Dr Logan Bores   BREAST ENHANCEMENT SURGERY Bilateral 2000   [facility]   CYST EXCISION     ENDOBRONCHIAL ULTRASOUND N/A 02/13/2016   Procedure: ENDOBRONCHIAL ULTRASOUND;  Surgeon: Shane Crutch, MD;  Location: [facility] ORS;  Service: Cardiopulmonary;  Laterality: N/A;   MEDIASTINOSCOPY N/A 03/29/2016   Procedure: Video MEDIASTINOSCOPY WITH LYMPH NODE BIOPSY;  Surgeon: Delight Ovens, MD;  Location[facility] OR;  Service: Thoracic;  Laterality: N/A;   OVARY SURGERY     one ovary and cervix at [facility]    VAGINAL DELIVERY     twins    VIDEO BRONCHOSCOPY N/A 03/29/2016   Procedure: VIDEO BRONCHOSCOPY;  Surgeon: Delight Ovens, MD;  Location: [facility] OR;  Service: Thoracic;  Laterality: N/A;    Family History MGF- CRC No h/o GI disease or malignancy  Review of Systems  Constitutional:  Negative for activity change, appetite change, chills, diaphoresis, fatigue, fever and unexpected weight change.  HENT:  Negative for trouble swallowing and voice change.   Respiratory:  Negative for shortness of breath and wheezing.   Cardiovascular:  Negative for chest pain, palpitations and leg swelling.  Gastrointestinal:  Positive for diarrhea. Negative for abdominal distention, abdominal pain, anal bleeding, blood in stool, constipation, nausea, rectal pain and vomiting.  Skin:  Negative for color change and pallor.  Neurological:  Negative for dizziness, syncope and weakness.  Psychiatric/Behavioral:  Negative for confusion.   All other systems reviewed and are negative.    Medications No current facility-administered medications on file prior to encounter.   Current Outpatient Medications on File Prior to Encounter  Medication Sig Dispense Refill   ALPRAZolam (XANAX) 0.5 MG tablet Take 1 tablet (0.5 mg total) by mouth 2 (two) times daily as needed for anxiety. Reported on 04/05/2015 (Patient not taking: Reported on 10/10/2020) 60 tablet 5   amoxicillin-clavulanate (AUGMENTIN) 875-125 MG tablet Take 1 tablet by mouth 2 (two) times daily. (Patient not taking: Reported on 03/09/2021) 14 tablet 0   Barberry-Oreg Grape-Goldenseal (BERBERINE COMPLEX  PO) Take 200 mg by mouth daily. (Patient not taking: Reported on 03/09/2021)     benzonatate (TESSALON) 100 MG capsule Take 1 capsule (100 mg total) by mouth 3 (three) times daily as needed. 30 capsule 0   buPROPion (WELLBUTRIN XL) 300 MG 24 hr tablet Take 1 tablet (300 mg total) by mouth daily. 30 tablet 12   Cyanocobalamin (VITAMIN B-12 PO) Take by mouth.      fluticasone (FLONASE) 50 MCG/ACT nasal spray PLACE 2 SPRAYS INTO BOTH NOSTRILS DAILY 16 g 0   hydrocortisone 2.5 % cream Apply to corners of the mouth QD 5d/wk PRN. 20 g 3   ketoconazole (NIZORAL) 2 % cream Apply to corners of the mouth QD. (Patient not taking: Reported on 12/12/2021) 30 g 3   loratadine (CLARITIN) 10 MG tablet Take 10 mg by mouth daily as needed for allergies.     Nutritional Supplements (JUICE PLUS FIBRE PO) Take by mouth.     Ruxolitinib Phosphate (OPZELURA) 1.5 % CREA Apply 1 application topically daily. For vitiligo under the arms apply once daily. (Patient not taking: Reported on 12/12/2021) 60 g 3   sertraline (ZOLOFT) 50 MG tablet TAKE 1 TABLET BY MOUTH ONCE DAILY WITH BREAKFAST 90 tablet 0   sertraline (ZOLOFT) 50 MG tablet TAKE 1 TABLET BY MOUTH DAILY WITH BREAKFAST 90 tablet 0   sertraline (ZOLOFT) 50 MG tablet TAKE 1 TABLET BY MOUTH DAILY WITH BREAKFAST 30 tablet 0   traZODone (DESYREL) 50 MG tablet TAKE 1 TABLET BY MOUTH AT BEDTIME AS NEEDED (Patient not taking: Reported on 03/09/2021) 30 tablet 0    Pertinent medications related to GI and procedure were reviewed by me with the patient prior to the procedure  No current facility-administered medications for this encounter.      Allergies  Allergen Reactions   Codeine Itching    itching   Allergies were reviewed by me prior to the procedure  Objective   There is no height or weight on file to calculate BMI. Vitals:   07/19/22 0722  BP: 125/80  Pulse: 63  Resp: 16  Temp: (!) 95.8 F (35.4 C)  TempSrc: Temporal  SpO2: 100%     Physical Exam Vitals and nursing note reviewed.  Constitutional:      General: She is not in acute distress.    Appearance: Normal appearance. She is not ill-appearing, toxic-appearing or diaphoretic.  HENT:     Head: Normocephalic and atraumatic.     Nose: Nose normal.     Mouth/Throat:     Mouth: Mucous membranes are moist.     Pharynx: Oropharynx is clear.  Eyes:      General: No scleral icterus.    Extraocular Movements: Extraocular movements intact.  Cardiovascular:     Rate and Rhythm: Normal rate and regular rhythm.     Heart sounds: Normal heart sounds. No murmur heard.    No friction rub. No gallop.  Pulmonary:     Effort: Pulmonary effort is normal. No respiratory distress.     Breath sounds: Normal breath sounds. No wheezing, rhonchi or rales.  Abdominal:     General: Bowel sounds are normal. There is no distension.     Palpations: Abdomen is soft.     Tenderness: There is no abdominal tenderness. There is no guarding or rebound.  Musculoskeletal:     Cervical back: Neck supple.     Right lower leg: No edema.     Left lower leg: No edema.  Skin:  General: Skin is warm and dry.     Coloration: Skin is not jaundiced or pale.  Neurological:     General: No focal deficit present.     Mental Status: She is alert and oriented to person, place, and time. Mental status is at baseline.  Psychiatric:        Mood and Affect: Mood normal.        Behavior: Behavior normal.        Thought Content: Thought content normal.        Judgment: Judgment normal.      Assessment:  Ms. ARTEMISA SLADEK is a 52 y.o. female  who presents today for Colonoscopy for Colorectal cancer screening .  Plan:  Colonoscopy with possible intervention today  Colonoscopy with possible biopsy, control of bleeding, polypectomy, and interventions as necessary has been discussed with the patient/patient representative. Informed consent was obtained from the patient/patient representative after explaining the indication, nature, and risks of the procedure including but not limited to death, bleeding, perforation, missed neoplasm/lesions, cardiorespiratory compromise, and reaction to medications. Opportunity for questions was given and appropriate answers were provided. Patient/patient representative has verbalized understanding is amenable to undergoing the  procedure.   Jaynie Collins, DO  [facility]  Portions of the record may have been created with voice recognition software. Occasional wrong-word or 'sound-a-like' substitutions may have occurred due to the inherent limitations of voice recognition software.  Read the chart carefully and recognize, using context, where substitutions may have occurred.

## 2022-07-19 ENCOUNTER — Ambulatory Visit
Admission: RE | Admit: 2022-07-19 | Discharge: 2022-07-19 | Disposition: A | Discharge status: Home / Self Care | Payer: BC Managed Care – PPO | Attending: Gastroenterology | Admitting: Gastroenterology

## 2022-07-19 ENCOUNTER — Encounter
Admission: RE | Disposition: A | Discharge status: Home / Self Care | Payer: Self-pay | Source: Home / Self Care | Attending: Gastroenterology

## 2022-07-19 ENCOUNTER — Ambulatory Visit: Payer: BC Managed Care – PPO | Admitting: Anesthesiology

## 2022-07-19 ENCOUNTER — Encounter: Payer: Self-pay | Admitting: Gastroenterology

## 2022-07-19 DIAGNOSIS — I7300 Raynaud's syndrome without gangrene: Secondary | ICD-10-CM | POA: Diagnosis not present

## 2022-07-19 DIAGNOSIS — Z800 Family history of malignant neoplasm of digestive organs: Secondary | ICD-10-CM | POA: Diagnosis not present

## 2022-07-19 DIAGNOSIS — K640 First degree hemorrhoids: Secondary | ICD-10-CM | POA: Diagnosis not present

## 2022-07-19 DIAGNOSIS — F419 Anxiety disorder, unspecified: Secondary | ICD-10-CM | POA: Insufficient documentation

## 2022-07-19 DIAGNOSIS — K635 Polyp of colon: Secondary | ICD-10-CM | POA: Diagnosis not present

## 2022-07-19 DIAGNOSIS — Z1211 Encounter for screening for malignant neoplasm of colon: Secondary | ICD-10-CM | POA: Insufficient documentation

## 2022-07-19 DIAGNOSIS — Z79899 Other long term (current) drug therapy: Secondary | ICD-10-CM | POA: Insufficient documentation

## 2022-07-19 HISTORY — PX: COLONOSCOPY: SHX5424

## 2022-07-19 SURGERY — COLONOSCOPY
Anesthesia: General

## 2022-07-19 MED ORDER — MIDAZOLAM HCL 2 MG/2ML IJ SOLN
INTRAMUSCULAR | Status: DC | PRN
  Administered 2022-07-19: 2 mg via INTRAVENOUS

## 2022-07-19 MED ORDER — PROPOFOL 500 MG/50ML IV EMUL
INTRAVENOUS | Status: DC | PRN
  Administered 2022-07-19: 165 ug/kg/min via INTRAVENOUS

## 2022-07-19 MED ORDER — PROPOFOL 1000 MG/100ML IV EMUL
INTRAVENOUS | Status: AC
  Filled 2022-07-19: qty 100

## 2022-07-19 MED ORDER — LIDOCAINE HCL (CARDIAC) PF 100 MG/5ML IV SOSY
PREFILLED_SYRINGE | INTRAVENOUS | Status: DC | PRN
  Administered 2022-07-19: 100 mg via INTRAVENOUS

## 2022-07-19 MED ORDER — PROPOFOL 1000 MG/100ML IV EMUL
INTRAVENOUS | Status: AC
  Filled 2022-07-19: qty 200

## 2022-07-19 MED ORDER — MIDAZOLAM HCL 2 MG/2ML IJ SOLN
INTRAMUSCULAR | Status: AC
  Filled 2022-07-19: qty 2

## 2022-07-19 MED ORDER — SODIUM CHLORIDE 0.9 % IV SOLN: INTRAVENOUS | Status: DC

## 2022-07-19 MED ORDER — PROPOFOL 10 MG/ML IV BOLUS
INTRAVENOUS | Status: DC | PRN
  Administered 2022-07-19: 70 mg via INTRAVENOUS

## 2022-07-19 NOTE — Interval H&P Note (Signed)
History and Physical Interval Note: Preprocedure H&P from 07/19/22  was reviewed and there was no interval change after seeing and examining the patient.  Written consent was obtained from the patient after discussion of risks, benefits, and alternatives. Patient has consented to proceed with Colonoscopy with possible intervention   07/19/2022 7:28 AM  Barbara Frederick  has presented today for surgery, with the diagnosis of Colon cancer screening (Z12.11).  The various methods of treatment have been discussed with the patient and family. After consideration of risks, benefits and other options for treatment, the patient has consented to  Procedure(s): COLONOSCOPY (N/A) as a surgical intervention.  The patient's history has been reviewed, patient examined, no change in status, stable for surgery.  I have reviewed the patient's chart and labs.  Questions were answered to the patient's satisfaction.     Jaynie Collins

## 2022-07-19 NOTE — Anesthesia Preprocedure Evaluation (Addendum)
Anesthesia Evaluation  Patient identified by MRN, date of birth, ID band Patient awake    Reviewed: Allergy & Precautions, H&P , NPO status , Patient's Chart, lab work & pertinent test results  History of Anesthesia Complications (+) history of anesthetic complications (unable to pass 8.0 ETT)  Airway Mallampati: III  TM Distance: >3 FB Neck ROM: full    Dental no notable dental hx.    Pulmonary  Noncaseating granulomatous lymphadenitis identified on mediastinoscopy 03/29/16 suspicious for underlying sarcoidosis  H/o chronic cough and NTM infection improved   Pulmonary exam normal        Cardiovascular Normal cardiovascular exam  Raynaud's phenomenon   Neuro/Psych negative neurological ROS  negative psych ROS   GI/Hepatic Neg liver ROS,GERD  ,,  Endo/Other  negative endocrine ROS    Renal/GU negative Renal ROS  negative genitourinary   Musculoskeletal   Abdominal   Peds  Hematology negative hematology ROS (+)   Anesthesia Other Findings Sarcoidosis of lymph nodes- H/o lymphadenopathy s/p mediastinoscopy   Past Medical History: No date: Allergy No date: Anxiety No date: Arthritis     Comment:  L knee- OA 01/19/2015: Atypical mole     Comment:  left mid back paraspinal below bra/mild No date: Complication of anesthesia     Comment:  needed smaller ETT (anes unable to pass 8.0 or 8.5 ETT               02/13/16; 7.0 ETT used 08/24/07) 02/06/2021: Dysplastic nevus     Comment:  Right lat inframammary  Past Surgical History: 2010: ABDOMINAL HYSTERECTOMY     Comment:  Dr Logan Bores 2000: BREAST ENHANCEMENT SURGERY; Bilateral     Comment:  [place] [employer] No date: CYST EXCISION 02/13/2016: ENDOBRONCHIAL ULTRASOUND; N/A     Comment:  Procedure: ENDOBRONCHIAL ULTRASOUND;  Surgeon: Shane Crutch, MD;  Location: [facility] ORS;  Service:               Cardiopulmonary;  Laterality:  N/A; 03/29/2016: MEDIASTINOSCOPY; N/A     Comment:  Procedure: Video MEDIASTINOSCOPY WITH LYMPH NODE BIOPSY;              Surgeon: Delight Ovens, MD;  Location: [facility] OR;                Service: Thoracic;  Laterality: N/A; No date: OVARY SURGERY     Comment:  one ovary and cervix at [facility] No date: VAGINAL DELIVERY     Comment:  twins  03/29/2016: VIDEO BRONCHOSCOPY; N/A     Comment:  Procedure: VIDEO BRONCHOSCOPY;  Surgeon: Delight Ovens, MD;  Location: [facility] OR;  Service: Thoracic;                Laterality: N/A;     Reproductive/Obstetrics negative OB ROS                              Anesthesia Physical Anesthesia Plan  ASA: 2  Anesthesia Plan: General   Post-op Pain Management:    Induction: Intravenous  PONV Risk Score and Plan: Propofol infusion and TIVA  Airway Management Planned: Natural Airway  Additional Equipment:   Intra-op Plan:   Post-operative Plan:   Informed Consent: I have reviewed the patients History and Physical, chart, labs and  discussed the procedure including the risks, benefits and alternatives for the proposed anesthesia with the patient or authorized representative who has indicated his/her understanding and acceptance.     Dental Advisory Given  Plan Discussed with: CRNA and Surgeon  Anesthesia Plan Comments:          Anesthesia Quick Evaluation

## 2022-07-19 NOTE — Anesthesia Postprocedure Evaluation (Signed)
Anesthesia Post Note  Patient: Barbara Frederick  Procedure(s) Performed: COLONOSCOPY  Patient location during evaluation: Endoscopy Anesthesia Type: General Level of consciousness: awake and alert Pain management: pain level controlled Vital Signs Assessment: post-procedure vital signs reviewed and stable Respiratory status: spontaneous breathing, nonlabored ventilation and respiratory function stable Cardiovascular status: blood pressure returned to baseline and stable Postop Assessment: no apparent nausea or vomiting Anesthetic complications: no   No notable events documented.   Last Vitals:  Vitals:   07/19/22 0819 07/19/22 0829  BP: 102/69 118/80  Pulse: 62 (!) 55  Resp: 16 15  Temp:    SpO2: 100% 100%    Last Pain:  Vitals:   07/19/22 0829  TempSrc:   PainSc: 0-No pain                 Foye Deer

## 2022-07-19 NOTE — Op Note (Signed)
[facility] Patient Name: Barbara Frederick Procedure Date: 07/19/2022 7:17 AM MRN: [id number] Account #: [id number] Date of Birth: 1970-06-09 Admit Type: Outpatient Age: 52 Room: [facility] ENDO ROOM 1 Gender: Female Note Status: Finalized Instrument Name: Peds Colonoscope [id number] Procedure:             Colonoscopy Indications:           Screening for colorectal malignant neoplasm Providers:             Jaynie Collins DO, DO Referring MD:          Ferdinand Lango. Sullivan Lone, MD (Referring MD) Medicines:             Monitored Anesthesia Care Complications:         No immediate complications. Estimated blood loss:                         Minimal. Procedure:             Pre-Anesthesia Assessment:                        - Prior to the procedure, a History and Physical was                         performed, and patient medications and allergies were                         reviewed. The patient is competent. The risks and                         benefits of the procedure and the sedation options and                         risks were discussed with the patient. All questions                         were answered and informed consent was obtained.                         Patient identification and proposed procedure were                         verified by the physician, the nurse, the anesthetist                         and the technician in the endoscopy suite. Mental                         Status Examination: alert and oriented. Airway                         Examination: normal oropharyngeal airway and neck                         mobility. Respiratory Examination: clear to                         auscultation. CV Examination: RRR, no murmurs, no S3  or S4. Prophylactic Antibiotics: The patient does not                         require prophylactic antibiotics. Prior                         Anticoagulants: The patient has taken no  anticoagulant                         or antiplatelet agents. ASA Grade Assessment: II - A                         patient with mild systemic disease. After reviewing                         the risks and benefits, the patient was deemed in                         satisfactory condition to undergo the procedure. The                         anesthesia plan was to use monitored anesthesia care                         (MAC). Immediately prior to administration of                         medications, the patient was re-assessed for adequacy                         to receive sedatives. The heart rate, respiratory                         rate, oxygen saturations, blood pressure, adequacy of                         pulmonary ventilation, and response to care were                         monitored throughout the procedure. The physical                         status of the patient was re-assessed after the                         procedure.                        After obtaining informed consent, the colonoscope was                         passed under direct vision. Throughout the procedure,                         the patient's blood pressure, pulse, and oxygen                         saturations were monitored continuously. The  Colonoscope was introduced through the anus and                         advanced to the the terminal ileum, with                         identification of the appendiceal orifice and IC                         valve. The colonoscopy was performed without                         difficulty. The patient tolerated the procedure well.                         The quality of the bowel preparation was evaluated                         using the BBPS [facility] Bowel Preparation Scale) with                         scores of: Right Colon = 3, Transverse Colon = 3 and                         Left Colon = 3 (entire mucosa seen well with no                          residual staining, small fragments of stool or opaque                         liquid). The total BBPS score equals 9. The ileocecal                         valve, appendiceal orifice, and rectum were                         photographed. Findings:      The perianal and digital rectal examinations were normal. Pertinent       negatives include normal sphincter tone.      Three sessile polyps were found in the transverse colon (2) and cecum       (1). The polyps were 1 to 2 mm in size. These polyps were removed with a       jumbo cold forceps. Resection and retrieval were complete. Estimated       blood loss was minimal.      Normal mucosa was found in the entire colon. Biopsies for histology were       taken with a cold forceps from the right colon and left colon for       evaluation of microscopic colitis. Estimated blood loss was minimal.      Non-bleeding internal hemorrhoids were found during retroflexion. The       hemorrhoids were Grade I (internal hemorrhoids that do not prolapse).       Estimated blood loss: none.      The exam was otherwise without abnormality on direct and retroflexion       views. Impression:            - Three  1 to 2 mm polyps in the transverse colon and                         in the cecum, removed with a jumbo cold forceps.                         Resected and retrieved.                        - Normal mucosa in the entire examined colon. Biopsied.                        - Non-bleeding internal hemorrhoids.                        - The examination was otherwise normal on direct and                         retroflexion views. Recommendation:        - Patient has a contact number available for                         emergencies. The signs and symptoms of potential                         delayed complications were discussed with the patient.                         Return to normal activities tomorrow. Written                         discharge  instructions were provided to the patient.                        - Discharge patient to home.                        - Resume previous diet.                        - Continue present medications.                        - Await pathology results.                        - Repeat colonoscopy for surveillance based on                         pathology results.                        - Return to referring physician as previously                         scheduled.                        - The findings and recommendations were discussed with  the patient. Procedure Code(s):     --- Professional ---                        [phone], Colonoscopy, flexible; with biopsy, single or                         multiple Diagnosis Code(s):     --- Professional ---                        Z12.11, Encounter for screening for malignant neoplasm                         of colon                        D12.3, Benign neoplasm of transverse colon (hepatic                         flexure or splenic flexure)                        D12.0, Benign neoplasm of cecum                        K64.0, First degree hemorrhoids CPT copyright 2022 American Medical Association. All rights reserved. The codes documented in this report are preliminary and upon coder review may  be revised to meet current compliance requirements. Attending Participation:      I personally performed the entire procedure. Elfredia Nevins, DO Jaynie Collins DO, DO 07/19/2022 8:11:58 AM This report has been signed electronically. Number of Addenda: 0 Note Initiated On: 07/19/2022 7:17 AM Scope Withdrawal Time: 0 hours 14 minutes 31 seconds  Total Procedure Duration: 0 hours 22 minutes 24 seconds  Estimated Blood Loss:  Estimated blood loss was minimal.      [facility]

## 2022-07-19 NOTE — Anesthesia Procedure Notes (Signed)
Procedure Name: General with mask airway Date/Time: 07/19/2022 7:42 AM  Performed by: Mohammed Kindle, CRNAPre-anesthesia Checklist: Patient identified, Emergency Drugs available, Suction available and Patient being monitored Patient Re-evaluated:Patient Re-evaluated prior to induction Oxygen Delivery Method: Simple face mask Induction Type: IV induction Placement Confirmation: positive ETCO2, CO2 detector and breath sounds checked- equal and bilateral Dental Injury: Teeth and Oropharynx as per pre-operative assessment

## 2022-07-19 NOTE — Transfer of Care (Signed)
Immediate Anesthesia Transfer of Care Note  Patient: Barbara Frederick  Procedure(s) Performed: COLONOSCOPY  Patient Location: Endoscopy Unit  Anesthesia Type:General  Level of Consciousness: awake, drowsy, and patient cooperative  Airway & Oxygen Therapy: Patient Spontanous Breathing and Patient connected to face mask oxygen  Post-op Assessment: Report given to RN and Post -op Vital signs reviewed and stable  Post vital signs: Reviewed and stable  Last Vitals:  Vitals Value Taken Time  BP 102/69 07/19/22 0819  Temp 35.3 C 07/19/22 0809  Pulse 65 07/19/22 0820  Resp 19 07/19/22 0820  SpO2 99 % 07/19/22 0820  Vitals shown include unvalidated device data.  Last Pain:  Vitals:   07/19/22 0809  TempSrc: Temporal  PainSc: Asleep         Complications: No notable events documented.

## 2022-07-22 ENCOUNTER — Encounter: Payer: Self-pay | Admitting: Gastroenterology

## 2022-07-22 LAB — SURGICAL PATHOLOGY

## 2022-10-02 ENCOUNTER — Other Ambulatory Visit (HOSPITAL_BASED_OUTPATIENT_CLINIC_OR_DEPARTMENT_OTHER): Payer: Self-pay

## 2023-04-22 ENCOUNTER — Other Ambulatory Visit: Payer: Self-pay

## 2023-04-28 ENCOUNTER — Other Ambulatory Visit: Payer: Self-pay | Admitting: Obstetrics and Gynecology

## 2023-04-28 DIAGNOSIS — Z1231 Encounter for screening mammogram for malignant neoplasm of breast: Secondary | ICD-10-CM

## 2023-05-12 ENCOUNTER — Ambulatory Visit
Admission: RE | Admit: 2023-05-12 | Discharge: 2023-05-12 | Disposition: A | Discharge status: Home / Self Care | Payer: BC Managed Care – PPO | Source: Ambulatory Visit | Attending: Obstetrics and Gynecology | Admitting: Obstetrics and Gynecology

## 2023-05-12 DIAGNOSIS — Z1231 Encounter for screening mammogram for malignant neoplasm of breast: Secondary | ICD-10-CM | POA: Diagnosis present

## 2023-05-14 ENCOUNTER — Other Ambulatory Visit: Payer: Self-pay | Admitting: Internal Medicine

## 2023-05-14 DIAGNOSIS — R0789 Other chest pain: Secondary | ICD-10-CM

## 2023-08-07 ENCOUNTER — Ambulatory Visit: Payer: BC Managed Care – PPO | Admitting: Dermatology

## 2023-08-07 DIAGNOSIS — W908XXA Exposure to other nonionizing radiation, initial encounter: Secondary | ICD-10-CM | POA: Diagnosis not present

## 2023-08-07 DIAGNOSIS — Z1283 Encounter for screening for malignant neoplasm of skin: Secondary | ICD-10-CM

## 2023-08-07 DIAGNOSIS — L578 Other skin changes due to chronic exposure to nonionizing radiation: Secondary | ICD-10-CM | POA: Diagnosis not present

## 2023-08-07 DIAGNOSIS — L814 Other melanin hyperpigmentation: Secondary | ICD-10-CM

## 2023-08-07 DIAGNOSIS — L80 Vitiligo: Secondary | ICD-10-CM

## 2023-08-07 DIAGNOSIS — D229 Melanocytic nevi, unspecified: Secondary | ICD-10-CM

## 2023-08-07 DIAGNOSIS — D1801 Hemangioma of skin and subcutaneous tissue: Secondary | ICD-10-CM

## 2023-08-07 DIAGNOSIS — Z86018 Personal history of other benign neoplasm: Secondary | ICD-10-CM

## 2023-08-07 DIAGNOSIS — L719 Rosacea, unspecified: Secondary | ICD-10-CM | POA: Diagnosis not present

## 2023-08-07 DIAGNOSIS — Z79899 Other long term (current) drug therapy: Secondary | ICD-10-CM

## 2023-08-07 DIAGNOSIS — L821 Other seborrheic keratosis: Secondary | ICD-10-CM

## 2023-08-07 DIAGNOSIS — Z7189 Other specified counseling: Secondary | ICD-10-CM

## 2023-08-07 MED ORDER — METRONIDAZOLE 1 % EX GEL: CUTANEOUS | 11 refills | Status: AC

## 2023-08-07 NOTE — Patient Instructions (Signed)

## 2023-08-07 NOTE — Progress Notes (Signed)
 Follow-Up Visit   Subjective  Barbara Frederick is a 53 y.o. female who presents for the following: Skin Cancer Screening and Full Body Skin Exam  The patient presents for Total-Body Skin Exam (TBSE) for skin cancer screening and mole check. The patient has spots, moles and lesions to be evaluated, some may be new or changing and the patient may have concern these could be cancer.  Pt has a new irregular skin lesion of the L inframammary area that she would like checked today.  The following portions of the chart were reviewed this encounter and updated as appropriate: medications, allergies, medical history  Review of Systems:  No other skin or systemic complaints except as noted in HPI or Assessment and Plan.  Objective  Well appearing patient in no apparent distress; mood and affect are within normal limits.  A full examination was performed including scalp, head, eyes, ears, nose, lips, neck, chest, axillae, abdomen, back, buttocks, bilateral upper extremities, bilateral lower extremities, hands, feet, fingers, toes, fingernails, and toenails. All findings within normal limits unless otherwise noted below.   Relevant physical exam findings are noted in the Assessment and Plan.    Assessment & Plan   SKIN CANCER SCREENING PERFORMED TODAY.  ACTINIC DAMAGE - Chronic condition, secondary to cumulative UV/sun exposure - diffuse scaly erythematous macules with underlying dyspigmentation - Recommend daily broad spectrum sunscreen SPF 30+ to sun-exposed areas, reapply every 2 hours as needed.  - Staying in the shade or wearing long sleeves, sun glasses (UVA+UVB protection) and wide brim hats (4-inch brim around the entire circumference of the hat) are also recommended for sun protection.  - Call for new or changing lesions.  LENTIGINES, SEBORRHEIC KERATOSES, HEMANGIOMAS - Benign normal skin lesions - Benign-appearing - Call for any changes  MELANOCYTIC NEVI - Tan-brown and/or  pink-flesh-colored symmetric macules and papules - Benign appearing on exam today - Observation - Call clinic for new or changing moles - Recommend daily use of broad spectrum spf 30+ sunscreen to sun-exposed areas.   ROSACEA Exam Mid face erythema with telangiectasias. Chronic condition with duration or expected duration over one year. Currently well-controlled. Rosacea is a chronic progressive skin condition usually affecting the face of adults, causing redness and/or acne bumps. It is treatable but not curable. It sometimes affects the eyes (ocular rosacea) as well. It may respond to topical and/or systemic medication and can flare with stress, sun exposure, alcohol, exercise, topical steroids (including hydrocortisone /cortisone 10) and some foods.  Daily application of broad spectrum spf 30+ sunscreen to face is recommended to reduce flares. Treatment Plan Continue Metronidazole  1% gel QD PRN.  SEBORRHEIC KERATOSIS - L inframammary - Stuck-on, waxy, tan-brown papules and/or plaques  - Benign-appearing - Discussed benign etiology and prognosis. - Observe - Call for any changes  VITILIGO TSH normal 04/2023.  Pt also had many other labs done. Exam: depigmented patches on the hands Vitiligo is a chronic autoimmune condition which causes loss of skin pigment and is commonly seen on the face and may also involve areas of trauma like hands, elbows, knees, and ankles. There is no cure and it is difficult to treat.  Treatments include topical steroids and other topical anti-inflammatory ointments/creams and topical and oral Jak inhibitors.  Sometimes narrow band UV light therapy or Xtrac laser is helpful, both of which require twice weekly treatments for at least 3-6 months.  Antioxidant vitamins, such as Vitamins A,C,E,D, Folic Acid  and B12 may be added to enhance treatment. Heliocare may also  enhance treatment results. Treatment Plan: Pt has Opzelura  cream already - apply to aa every day- bid.   Suggested if she does not want to use on all areas, may just use on face and hands or areas that may show more.  HISTORY OF DYSPLASTIC NEVUS No evidence of recurrence today Recommend regular full body skin exams Recommend daily broad spectrum sunscreen SPF 30+ to sun-exposed areas, reapply every 2 hours as needed.  Call if any new or changing lesions are noted between office visits   SKIN CANCER SCREENING   ACTINIC SKIN DAMAGE   ROSACEA   LENTIGO   MELANOCYTIC NEVUS, UNSPECIFIED LOCATION   HISTORY OF DYSPLASTIC NEVUS   VITILIGO   Related Medications Ruxolitinib Phosphate  (OPZELURA ) 1.5 % CREA Apply 1 application topically daily. For vitiligo under the arms apply once daily. COUNSELING AND COORDINATION OF CARE   MEDICATION MANAGEMENT   SEBORRHEIC KERATOSIS   Return in about 1 year (around 08/06/2024) for TBSE.  Arlinda Lais, CMA, am acting as scribe for Celine Collard, MD .   Documentation: I have reviewed the above documentation for accuracy and completeness, and I agree with the above.  Celine Collard, MD

## 2023-08-07 NOTE — Progress Notes (Deleted)
   Follow-Up Visit   Subjective  Barbara Frederick is a 53 y.o. female who presents for the following: New irregular skin lesion L inframammary, pt concerned and would like checked today.  The patient has spots, moles and lesions to be evaluated, some may be new or changing and the patient may have concern these could be cancer.   The following portions of the chart were reviewed this encounter and updated as appropriate: medications, allergies, medical history  Review of Systems:  No other skin or systemic complaints except as noted in HPI or Assessment and Plan.  Objective  Well appearing patient in no apparent distress; mood and affect are within normal limits.   A focused examination was performed of the following areas: the chest   Relevant exam findings are noted in the Assessment and Plan.    Assessment & Plan   ROSACEA Exam Mid face erythema with telangiectasias +/- scattered inflammatory papules***  wellcontrolled vs notatgoal vs flared***  Rosacea is a chronic progressive skin condition usually affecting the face of adults, causing redness and/or acne bumps. It is treatable but not curable. It sometimes affects the eyes (ocular rosacea) as well. It may respond to topical and/or systemic medication and can flare with stress, sun exposure, alcohol, exercise, topical steroids (including hydrocortisone /cortisone 10) and some foods.  Daily application of broad spectrum spf 30+ sunscreen to face is recommended to reduce flares.  Patient denies grittiness of the eyes***  Treatment Plan ***    No follow-ups on file.  Arlinda Lais, CMA, am acting as scribe for Celine Collard, MD .   Documentation: I have reviewed the above documentation for accuracy and completeness, and I agree with the above.  Celine Collard, MD

## 2023-08-11 ENCOUNTER — Encounter: Payer: Self-pay | Admitting: Dermatology

## 2023-10-23 ENCOUNTER — Other Ambulatory Visit: Payer: Self-pay | Admitting: Orthopedic Surgery

## 2023-10-23 DIAGNOSIS — M2392 Unspecified internal derangement of left knee: Secondary | ICD-10-CM

## 2023-10-23 DIAGNOSIS — M25562 Pain in left knee: Secondary | ICD-10-CM

## 2023-10-23 DIAGNOSIS — M1712 Unilateral primary osteoarthritis, left knee: Secondary | ICD-10-CM

## 2023-10-24 ENCOUNTER — Ambulatory Visit
Admission: RE | Admit: 2023-10-24 | Discharge: 2023-10-24 | Disposition: A | Discharge status: Home / Self Care | Source: Ambulatory Visit | Attending: Orthopedic Surgery | Admitting: Orthopedic Surgery

## 2023-10-24 DIAGNOSIS — M25562 Pain in left knee: Secondary | ICD-10-CM

## 2023-10-24 DIAGNOSIS — M2392 Unspecified internal derangement of left knee: Secondary | ICD-10-CM

## 2023-10-24 DIAGNOSIS — M1712 Unilateral primary osteoarthritis, left knee: Secondary | ICD-10-CM

## 2024-03-02 ENCOUNTER — Other Ambulatory Visit: Payer: Self-pay

## 2024-04-15 ENCOUNTER — Other Ambulatory Visit: Payer: Self-pay | Admitting: Family Medicine

## 2024-04-15 DIAGNOSIS — Z1231 Encounter for screening mammogram for malignant neoplasm of breast: Secondary | ICD-10-CM

## 2024-05-06 ENCOUNTER — Inpatient Hospital Stay

## 2024-05-06 ENCOUNTER — Inpatient Hospital Stay: Admitting: Oncology

## 2024-05-06 ENCOUNTER — Inpatient Hospital Stay: Admitting: Internal Medicine

## 2024-05-20 ENCOUNTER — Encounter

## 2024-08-02 ENCOUNTER — Ambulatory Visit: Admitting: Dermatology

## 2024-08-12 ENCOUNTER — Ambulatory Visit: Admitting: Dermatology
